# Patient Record
Sex: Female | Born: 1960
Health system: Southern US, Community
[De-identification: ages and names within clinical notes are randomized; demographics above are authoritative.]

## PROBLEM LIST (undated history)

## (undated) DIAGNOSIS — E7800 Pure hypercholesterolemia, unspecified: Secondary | ICD-10-CM

## (undated) DIAGNOSIS — R0789 Other chest pain: Secondary | ICD-10-CM

## (undated) DIAGNOSIS — R0602 Shortness of breath: Secondary | ICD-10-CM

## (undated) DIAGNOSIS — M8580 Other specified disorders of bone density and structure, unspecified site: Secondary | ICD-10-CM

## (undated) DIAGNOSIS — C541 Malignant neoplasm of endometrium: Secondary | ICD-10-CM

## (undated) DIAGNOSIS — Z923 Personal history of irradiation: Secondary | ICD-10-CM

## (undated) DIAGNOSIS — L819 Disorder of pigmentation, unspecified: Secondary | ICD-10-CM

## (undated) DIAGNOSIS — D219 Benign neoplasm of connective and other soft tissue, unspecified: Secondary | ICD-10-CM

## (undated) DIAGNOSIS — R079 Chest pain, unspecified: Secondary | ICD-10-CM

## (undated) DIAGNOSIS — R635 Abnormal weight gain: Secondary | ICD-10-CM

## (undated) DIAGNOSIS — S322XXA Fracture of coccyx, initial encounter for closed fracture: Secondary | ICD-10-CM

## (undated) DIAGNOSIS — T884XXA Failed or difficult intubation, initial encounter: Secondary | ICD-10-CM

## (undated) DIAGNOSIS — N369 Urethral disorder, unspecified: Secondary | ICD-10-CM

## (undated) DIAGNOSIS — E559 Vitamin D deficiency, unspecified: Secondary | ICD-10-CM

## (undated) DIAGNOSIS — J349 Unspecified disorder of nose and nasal sinuses: Secondary | ICD-10-CM

## (undated) DIAGNOSIS — R7303 Prediabetes: Secondary | ICD-10-CM

## (undated) DIAGNOSIS — F419 Anxiety disorder, unspecified: Secondary | ICD-10-CM

## (undated) DIAGNOSIS — F32A Depression, unspecified: Secondary | ICD-10-CM

## (undated) DIAGNOSIS — S3210XA Unspecified fracture of sacrum, initial encounter for closed fracture: Secondary | ICD-10-CM

## (undated) DIAGNOSIS — R238 Other skin changes: Secondary | ICD-10-CM

## (undated) DIAGNOSIS — F329 Major depressive disorder, single episode, unspecified: Secondary | ICD-10-CM

## (undated) DIAGNOSIS — R5383 Other fatigue: Secondary | ICD-10-CM

## (undated) DIAGNOSIS — K219 Gastro-esophageal reflux disease without esophagitis: Secondary | ICD-10-CM

## (undated) DIAGNOSIS — R233 Spontaneous ecchymoses: Secondary | ICD-10-CM

## (undated) DIAGNOSIS — K2970 Gastritis, unspecified, without bleeding: Secondary | ICD-10-CM

## (undated) HISTORY — DX: Other specified disorders of bone density and structure, unspecified site: M85.80

## (undated) HISTORY — DX: Pure hypercholesterolemia, unspecified: E78.00

## (undated) HISTORY — DX: Spontaneous ecchymoses: R23.3

## (undated) HISTORY — DX: Anxiety disorder, unspecified: F41.9

## (undated) HISTORY — DX: Depression, unspecified: F32.A

## (undated) HISTORY — PX: SMALL BOWEL ENTEROSCOPY: SHX2415

## (undated) HISTORY — PX: ADENOIDECTOMY: SUR15

## (undated) HISTORY — DX: Chest pain, unspecified: R07.9

## (undated) HISTORY — DX: Other skin changes: R23.8

## (undated) HISTORY — DX: Vitamin D deficiency, unspecified: E55.9

## (undated) HISTORY — DX: Unspecified fracture of sacrum, initial encounter for closed fracture: S32.2XXA

## (undated) HISTORY — DX: Shortness of breath: R06.02

## (undated) HISTORY — PX: TONSILLECTOMY: SUR1361

## (undated) HISTORY — DX: Abnormal weight gain: R63.5

## (undated) HISTORY — DX: Gastro-esophageal reflux disease without esophagitis: K21.9

## (undated) HISTORY — DX: Disorder of pigmentation, unspecified: L81.9

## (undated) HISTORY — PX: COLONOSCOPY: SHX174

## (undated) HISTORY — DX: Gastritis, unspecified, without bleeding: K29.70

## (undated) HISTORY — DX: Benign neoplasm of connective and other soft tissue, unspecified: D21.9

## (undated) HISTORY — DX: Other chest pain: R07.89

## (undated) HISTORY — DX: Other fatigue: R53.83

## (undated) HISTORY — DX: Unspecified disorder of nose and nasal sinuses: J34.9

## (undated) HISTORY — DX: Malignant neoplasm of endometrium: C54.1

## (undated) HISTORY — DX: Unspecified fracture of sacrum, initial encounter for closed fracture: S32.10XA

## (undated) HISTORY — DX: Major depressive disorder, single episode, unspecified: F32.9

## (undated) NOTE — *Deleted (*Deleted)
  Radiation Oncology         [phone] ________________________________  Name: Emily [name] MRN: [id number]  Date: 06/28/2020  DOB: July 21, 1961  CC: Shirlean Mylar, MD  Carver Fila, MD  HDR BRACHYTHERAPY NOTE  DIAGNOSIS: Stage IIIA (pT3a, pN0) endometrioid endometrial adenocarcinoma, FIGO grade 2   Simple treatment device note: Patient had construction of her custom vaginal cylinder. She will be treated with a *** cm diameter segmented cylinder. This conforms to her anatomy without undue discomfort.  Vaginal brachytherapy procedure node: The patient was brought to the HDR suite. Identity was confirmed. All relevant records and images related to the planned course of therapy were reviewed. The patient freely provided informed written consent to proceed with treatment after reviewing the details related to the planned course of therapy. The consent form was witnessed and verified by the simulation staff. Then, the patient was set-up in a stable reproducible supine position for radiation therapy. Pelvic exam revealed the vaginal cuff to be intact ***. The patient's custom vaginal cylinder was placed in the proximal vagina. This was affixed to the CT/MR stabilization plate to prevent slippage. Patient tolerated the placement well.  Verification simulation note:  A fiducial marker was placed within the vaginal cylinder. An AP and lateral film was then obtained through the pelvis area. This documented accurate position of the vaginal cylinder for treatment.  HDR BRACHYTHERAPY TREATMENT  The remote afterloading device was affixed to the vaginal cylinder by catheter. Patient then proceeded to undergo her first high-dose-rate treatment directed at the proximal vagina. The patient was prescribed a dose of *** gray to be delivered to the mucosal surface. Treatment length was *** cm. Patient was treated with *** channel using *** dwell positions. Treatment time was *** seconds. Iridium 192 was  the high-dose-rate source for treatment. The patient tolerated the treatment well. After completion of her therapy, a radiation survey was performed documenting return of the iridium source into the GammaMed safe.   PLAN: The patient will return on 07/09/2020 for her second high-dose-rate treatment. ________________________________    Billie Lade, PhD, MD  This document serves as a record of services personally performed by Antony Blackbird, MD. It was created on his behalf by Nikki Dom, a trained medical scribe. The creation of this record is based on the scribe's personal observations and the provider's statements to them. This document has been checked and approved by the attending provider.

---

## 2003-02-20 ENCOUNTER — Emergency Department (HOSPITAL_COMMUNITY): Admission: EM | Admit: 2003-02-20 | Discharge: 2003-02-20 | Payer: Self-pay | Admitting: Emergency Medicine

## 2003-10-25 ENCOUNTER — Encounter: Admission: RE | Admit: 2003-10-25 | Discharge: 2003-10-25 | Payer: Self-pay | Admitting: Internal Medicine

## 2003-11-29 ENCOUNTER — Emergency Department (HOSPITAL_COMMUNITY): Admission: AD | Admit: 2003-11-29 | Discharge: 2003-11-29 | Payer: Self-pay | Admitting: Family Medicine

## 2003-11-29 ENCOUNTER — Other Ambulatory Visit: Admission: RE | Admit: 2003-11-29 | Discharge: 2003-11-29 | Payer: Self-pay | Admitting: Obstetrics and Gynecology

## 2004-03-25 ENCOUNTER — Encounter: Admission: RE | Admit: 2004-03-25 | Discharge: 2004-03-25 | Payer: Self-pay | Admitting: Internal Medicine

## 2004-04-10 ENCOUNTER — Emergency Department (HOSPITAL_COMMUNITY): Admission: EM | Admit: 2004-04-10 | Discharge: 2004-04-10 | Payer: Self-pay | Admitting: Emergency Medicine

## 2004-04-15 ENCOUNTER — Encounter: Admission: RE | Admit: 2004-04-15 | Discharge: 2004-04-15 | Payer: Self-pay | Admitting: Obstetrics and Gynecology

## 2005-01-28 ENCOUNTER — Ambulatory Visit: Payer: Self-pay | Admitting: Internal Medicine

## 2009-12-05 ENCOUNTER — Other Ambulatory Visit: Admission: RE | Admit: 2009-12-05 | Discharge: 2009-12-05 | Payer: Self-pay | Admitting: Obstetrics and Gynecology

## 2010-04-17 ENCOUNTER — Emergency Department (HOSPITAL_COMMUNITY): Admission: EM | Admit: 2010-04-17 | Discharge: 2010-04-17 | Payer: Self-pay | Admitting: Family Medicine

## 2011-05-11 ENCOUNTER — Inpatient Hospital Stay (INDEPENDENT_AMBULATORY_CARE_PROVIDER_SITE_OTHER)
Admission: RE | Admit: 2011-05-11 | Discharge: 2011-05-11 | Disposition: A | Discharge status: Home / Self Care | Payer: Commercial Managed Care - PPO | Source: Ambulatory Visit | Attending: Family Medicine | Admitting: Family Medicine

## 2014-11-01 ENCOUNTER — Other Ambulatory Visit: Payer: Self-pay | Admitting: Family Medicine

## 2014-11-01 DIAGNOSIS — R102 Pelvic and perineal pain: Secondary | ICD-10-CM

## 2014-11-06 ENCOUNTER — Ambulatory Visit
Admission: RE | Admit: 2014-11-06 | Discharge: 2014-11-06 | Disposition: A | Discharge status: Home / Self Care | Payer: 59 | Source: Ambulatory Visit | Attending: Family Medicine | Admitting: Family Medicine

## 2014-11-06 DIAGNOSIS — R102 Pelvic and perineal pain: Secondary | ICD-10-CM

## 2015-07-16 ENCOUNTER — Ambulatory Visit (INDEPENDENT_AMBULATORY_CARE_PROVIDER_SITE_OTHER): Payer: 59 | Admitting: Cardiology

## 2015-07-16 ENCOUNTER — Encounter: Payer: Self-pay | Admitting: Cardiology

## 2015-07-16 VITALS — BP 112/58 | HR 63 | Ht 64.0 in | Wt 152.0 lb

## 2015-07-16 DIAGNOSIS — S322XXA Fracture of coccyx, initial encounter for closed fracture: Secondary | ICD-10-CM

## 2015-07-16 DIAGNOSIS — R0602 Shortness of breath: Secondary | ICD-10-CM

## 2015-07-16 DIAGNOSIS — F329 Major depressive disorder, single episode, unspecified: Secondary | ICD-10-CM

## 2015-07-16 DIAGNOSIS — Z8249 Family history of ischemic heart disease and other diseases of the circulatory system: Secondary | ICD-10-CM | POA: Diagnosis not present

## 2015-07-16 DIAGNOSIS — D219 Benign neoplasm of connective and other soft tissue, unspecified: Secondary | ICD-10-CM | POA: Insufficient documentation

## 2015-07-16 DIAGNOSIS — R9431 Abnormal electrocardiogram [ECG] [EKG]: Secondary | ICD-10-CM

## 2015-07-16 DIAGNOSIS — R635 Abnormal weight gain: Secondary | ICD-10-CM | POA: Insufficient documentation

## 2015-07-16 DIAGNOSIS — E785 Hyperlipidemia, unspecified: Secondary | ICD-10-CM

## 2015-07-16 DIAGNOSIS — E7800 Pure hypercholesterolemia, unspecified: Secondary | ICD-10-CM

## 2015-07-16 DIAGNOSIS — E559 Vitamin D deficiency, unspecified: Secondary | ICD-10-CM | POA: Insufficient documentation

## 2015-07-16 DIAGNOSIS — S3210XA Unspecified fracture of sacrum, initial encounter for closed fracture: Secondary | ICD-10-CM

## 2015-07-16 DIAGNOSIS — M8580 Other specified disorders of bone density and structure, unspecified site: Secondary | ICD-10-CM

## 2015-07-16 DIAGNOSIS — F419 Anxiety disorder, unspecified: Secondary | ICD-10-CM | POA: Insufficient documentation

## 2015-07-16 DIAGNOSIS — R079 Chest pain, unspecified: Secondary | ICD-10-CM | POA: Insufficient documentation

## 2015-07-16 DIAGNOSIS — R0789 Other chest pain: Secondary | ICD-10-CM

## 2015-07-16 DIAGNOSIS — L819 Disorder of pigmentation, unspecified: Secondary | ICD-10-CM

## 2015-07-16 DIAGNOSIS — F32A Depression, unspecified: Secondary | ICD-10-CM

## 2015-07-16 DIAGNOSIS — D259 Leiomyoma of uterus, unspecified: Secondary | ICD-10-CM

## 2015-07-16 DIAGNOSIS — R072 Precordial pain: Secondary | ICD-10-CM | POA: Diagnosis not present

## 2015-07-16 HISTORY — DX: Hyperlipidemia, unspecified: E78.5

## 2015-07-16 HISTORY — DX: Family history of ischemic heart disease and other diseases of the circulatory system: Z82.49

## 2015-07-16 HISTORY — DX: Abnormal electrocardiogram (ECG) (EKG): R94.31

## 2015-07-16 NOTE — Progress Notes (Signed)
Patient ID: Emily [name], female   DOB: 1960/09/26, 54 y.o.   MRN: [id number]      Cardiology Office Note  Date:  07/16/2015   ID:  Macie Baum, DOB 02/05/1961, MRN [id number]  PCP:  Jonathon Bellows, MD  Cardiologist:  Dorothy Spark, MD   Chief complain: Chest pain   History of Present Illness: Emily [name] is a 54 y.o. female who presents for evaluation of chest pain and dyspnea on exertion. The patient states that she used to be very active and run, currently minimally active. 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 She started to experience left sided chest pains and left arm pain not related to activity. She describes that occasionally she pushes herself to run but feels exhausted and unusually tired afterwards. No palpitations or syncope. Her lipids have been elevated but she is not being treated. The patient is very anxious. Her father had MI at age 35, and died of MI in his 47'. Her mother was diagnosed with atrial fibrillation with bradycardia requiring PM placement and had a complication - RV perforation. The patient was referred to [place] from her PCP office for abnormal ECG.  No h/o SCD in her family.   Past Medical History  Diagnosis Date  . Elevated cholesterol   . Fx sacrum/coccyx-closed ([place])   . Osteopenia   . Chest tightness   . Fatigue   . Chest pain   . Weight gain   . SOB (shortness of breath) on exertion   . Anxiety and depression   . Pigmented skin lesions   . Fibroid   . Vitamin D deficiency     Past Surgical History  Procedure Laterality Date  . No past surgeries       Current Outpatient Prescriptions  Medication Sig Dispense Refill  . benzonatate (TESSALON PERLES) 100 MG capsule Take 100 mg by mouth 3 (three) times daily as needed for cough.     No current facility-administered medications for this visit.    Allergies:   Review of patient's allergies indicates no known allergies.    Social History:  The patient  reports that  she has never smoked. She does not have any smokeless tobacco history on file. She reports that she drinks alcohol.   Family History:  The patient's family history includes Atrial fibrillation in her father and mother.    ROS:  Please see the history of present illness.   Otherwise, review of systems are positive for none.   All other systems are reviewed and negative.    PHYSICAL EXAM: VS:  BP 112/58 mmHg  Pulse 63  Ht 5\' 4"  (1.626 m)  Wt 152 lb (68.947 kg)  BMI 26.08 kg/m2  SpO2 98% , BMI Body mass index is 26.08 kg/(m^2). GEN: Well nourished, well developed, in no acute distress HEENT: normal Neck: no JVD, carotid bruits, or masses Cardiac: RRR; no murmurs, rubs, or gallops,no edema  Respiratory:  clear to auscultation bilaterally, normal work of breathing GI: soft, nontender, nondistended, + BS MS: no deformity or atrophy Skin: warm and dry, no rash Neuro:  Strength and sensation are intact Psych: euthymic mood, full affect  EKG:  EKG is not ordered today. The ekg from 07/11/2015 from [facility] office shows SR, negative T waves in the inferior and anterolateral leads  Recent Labs: No results found for requested labs within last 365 days.   Lipid Panel No results found for: CHOL, TRIG, HDL, CHOLHDL, VLDL, LDLCALC, LDLDIRECT  Wt Readings from Last 3 Encounters:  07/16/15 152 lb (68.947 kg)      ASSESSMENT AND PLAN:  54 year old female  1. Chest pain with some typical and some atypical features - concern is profound fatigue after exertion and abnormal ECG with negative T waves in the inferior and anterolateral leads suggestive of ischemia.  This patient is very anxious, I have spent 45 minutes explaining diagnostic options, risks and benefits of all.   I suggested an exercise nuclear stress test that she refuses as her father had a negative stress test and had a heart attack a week later. She is offered a cardiac cath, and explained in details risks and benefits vs  coronary CT. She wants cath at first but is only willing to have with an interventional cardiologist with 30+ years of experience. She requests both cardiac cath and coronary CT, then wants to think about it and call us back.  She is advised that all of the above modalities wold be acceptable diagnostic option and she should proceed with one of them. She wants to call us back.   We will schedule an echocardiogram to evaluate for systolic and diastolic function and possible LV wall motion abnormalities.   2. BP - controlled  3. Hyperlipidemia - she had lipids drawn at [facility] office few days ago, per patient elevated, we will request and advise treatment appropriately.   Follow up in 2 months.   Signed, Dorothy Spark, MD  07/16/2015 3:49 PM    [facility] [place], [place],   [address] Phone: [phone]; Fax: [phone]

## 2015-07-16 NOTE — Patient Instructions (Signed)
Medication Instructions:   Your physician recommends that you continue on your current medications as directed. Please refer to the Current Medication list given to you today.    Testing/Procedures:  Your physician has requested that you have an echocardiogram. Echocardiography is a painless test that uses sound waves to create images of your heart. It provides your doctor with information about the size and shape of your heart and how well your heart's chambers and valves are working. This procedure takes approximately one hour. There are no restrictions for this procedure.     Follow-Up:  2 MONTHS WITH DR NELSON      If you need a refill on your cardiac medications before your next appointment, please call your pharmacy.   

## 2015-07-18 ENCOUNTER — Telehealth: Payer: Self-pay | Admitting: Cardiology

## 2015-07-18 NOTE — Telephone Encounter (Signed)
Pt calling to inform Dr Meda Coffee that she will proceed with [place] ordering for her to have a exercise myoview and coronary cta once her echo is complete and resulted next week.  Pt states she will be having her echo done on 07/25/15.  Pt prefers for Dr Meda Coffee and myself to hold off putting the order for the stress test and cta in, until her echo is resulted.  Informed the pt that we will respect her request.  Informed the pt that I will make Dr Meda Coffee aware of her decision to proceed with the stress test and coronary cta (if needed), after her echo is complete.  Pt verbalized understanding, and very gracious for all the assistance provided.

## 2015-07-18 NOTE — Telephone Encounter (Signed)
New Message   Pt wants to have stress test and CT after findings of Echo

## 2015-07-19 ENCOUNTER — Encounter: Payer: Self-pay | Admitting: Cardiology

## 2015-07-20 ENCOUNTER — Telehealth: Payer: Self-pay | Admitting: Cardiology

## 2015-07-20 NOTE — Telephone Encounter (Signed)
New Message    Pt calling stating she has questions about her upcoming Echo. Pt states that with her [facility] insurance the only way for them to cover it at 100% is for it to be done within 7 days from her last appt. The soonest appt we have for an Echo is 07/24/15 and the pt states this is outside of the 7 day window and she needs to get her Echo done prior to then. Pt wanted to know if she could get this done somewhere else to see if she could get it done on Monday and I notified the pt that [facility] could do it but she would need to call their Echo department to find out if they had any openings. Pt states that she needs a call back from [facility] asap to help her with this. Notified pt that Karlene Einstein is not here today and the message will be sent to triage. Pt states she needs a call back today from any nurse. Please call back and advise.

## 2015-07-20 NOTE — Telephone Encounter (Signed)
Patient st because her tests were not scheduled within 7 days of her OV, [employer] will not pay 100% of the cost. Spoke with Billing and Everlean Patterson, who both state this might be a misunderstanding about hospital vs outpatient cost.  Patient adamant that there is a 7 day period that all her tests must be done. She requests our billing department look into it and get her out of pocket cost per test she needs done (for ECHO, myoview, and CTA).  Informed patient a message will be sent to Billing and either Billing or Dr. Francesca Oman nurse will call with an update early next week. Patient agrees with treatment plan.

## 2015-07-23 NOTE — Telephone Encounter (Signed)
Left message for the pt to call back in regards to call placed on 07/20/15 with concerns of insurance and having her echo done.  Have a blocked time for today for the pt to have her echo done at 2pm at our office.  Left a detailed message for the pt to call back to confirm this appointment date and time, for this will meet her "7 day look back period, from her OV with Dr Meda Coffee to meet 100 % coverage through [facility]."  This is coming from the pt stating this on Friday 11/11, to a triage nurse. Highly advised the pt to call back to confirm this held slot.

## 2015-07-23 NOTE — Telephone Encounter (Signed)
Pt called back and I offered her an echo appt for today at 2 pm and she declined this offer, stating this is greater than her "7 day look back period." Informed the pt that being this is out of a nurses scope and more of her concerns are geared towards billing questions with her insurance coverage, I will go and speak with Billing dept and have them follow-up with the pt.  Did speak with Suanne Marker in billing and she has ample information to provide the pt based on her insurance plan, and having a Specialist order tests.  Per Suanne Marker she will contact the pt via phone to inform her of what her insurance will cover, for her to receive the echo.

## 2015-07-24 ENCOUNTER — Ambulatory Visit (HOSPITAL_COMMUNITY): Payer: 59

## 2015-07-25 ENCOUNTER — Other Ambulatory Visit (HOSPITAL_COMMUNITY): Payer: 59

## 2015-07-26 ENCOUNTER — Telehealth: Payer: Self-pay | Admitting: Cardiology

## 2015-07-26 NOTE — Telephone Encounter (Signed)
Error

## 2015-09-19 ENCOUNTER — Ambulatory Visit (HOSPITAL_COMMUNITY): Payer: 59

## 2015-09-19 ENCOUNTER — Ambulatory Visit: Payer: 59 | Admitting: Cardiology

## 2015-09-19 ENCOUNTER — Other Ambulatory Visit (HOSPITAL_COMMUNITY): Payer: 59

## 2015-09-19 ENCOUNTER — Other Ambulatory Visit: Payer: Self-pay

## 2015-09-19 ENCOUNTER — Ambulatory Visit (HOSPITAL_COMMUNITY): Payer: 59 | Attending: Cardiovascular Disease

## 2015-09-19 DIAGNOSIS — R9431 Abnormal electrocardiogram [ECG] [EKG]: Secondary | ICD-10-CM

## 2015-09-19 DIAGNOSIS — E785 Hyperlipidemia, unspecified: Secondary | ICD-10-CM | POA: Diagnosis not present

## 2015-09-19 DIAGNOSIS — R072 Precordial pain: Secondary | ICD-10-CM | POA: Diagnosis not present

## 2015-09-19 DIAGNOSIS — Z8249 Family history of ischemic heart disease and other diseases of the circulatory system: Secondary | ICD-10-CM | POA: Insufficient documentation

## 2015-09-25 ENCOUNTER — Ambulatory Visit (INDEPENDENT_AMBULATORY_CARE_PROVIDER_SITE_OTHER): Payer: 59 | Admitting: Cardiology

## 2015-09-25 ENCOUNTER — Encounter: Payer: Self-pay | Admitting: Cardiology

## 2015-09-25 VITALS — BP 110/80 | HR 64 | Ht 64.0 in | Wt 145.0 lb

## 2015-09-25 DIAGNOSIS — R0602 Shortness of breath: Secondary | ICD-10-CM | POA: Diagnosis not present

## 2015-09-25 DIAGNOSIS — Z8249 Family history of ischemic heart disease and other diseases of the circulatory system: Secondary | ICD-10-CM

## 2015-09-25 DIAGNOSIS — R079 Chest pain, unspecified: Secondary | ICD-10-CM

## 2015-09-25 DIAGNOSIS — R9431 Abnormal electrocardiogram [ECG] [EKG]: Secondary | ICD-10-CM

## 2015-09-25 NOTE — Assessment & Plan Note (Signed)
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

## 2015-09-25 NOTE — Patient Instructions (Signed)
Medication Instructions:   CONTINUE SAME MEDICATIONS  If you need a refill on your cardiac medications before your next appointment, please call your pharmacy.  Labwork: NONE ORDER TODAY    Testing/Procedures:   Your physician has requested that you have cardiac CT. Cardiac computed tomography (CT) is a painless test that uses an x-ray machine to take clear, detailed pictures of your heart. For further information please visit [email or web address]. Please follow instruction sheet as given.     Follow-Up: AS SCHEDULED 2/.8/17   Any Other Special Instructions Will Be Listed Below (If Applicable).

## 2015-09-25 NOTE — Progress Notes (Signed)
09/25/2015 Emily [name]   May 29, 1961  [id number]  Primary Physician Jonathon Bellows, MD Primary Cardiologist: Dr Meda Coffee  HPI:  55 y.o. female seen by Dr Meda Coffee 07/16/15  for evaluation of chest pain and dyspnea on exertion. The patient states that she used to be very active and run, currently minimally active. 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 She started to experience left sided chest pains and left arm pain in Nov that is hard for her to describe.  She says that occasionally she pushes herself to run but feels exhausted and unusually tired afterwards.Sh denies palpitations or syncope. Her lipids have been elevated but she is not being treated. Her father had MI at age 68, and died of MI in his 37'. Her mother was diagnosed with atrial fibrillation with bradycardia requiring PM placement and had a complication - RV perforation.The patient was referred to Dr Meda Coffee by her PCP office for the above symptoms and an abnormal ECG with TWI in V3-V6. Dr Meda Coffee discussed further work up with the pt at length during her visit in Nov. Dr Meda Coffee suggested an exercise nuclear stress test, the pt refused as her father had a negative stress test and had a heart attack a week later. She was offered a cardiac cath, and explained in details risks and benefits vs coronary CT. She wanted a cath at first but is only willing to have with an interventional cardiologist with 30+ years of experience perform this. She then wanted to think about it and call us back. In the interm she did agree to an echo. This was done 09/19/15. She is here for follow up from that echo.    Current Outpatient Prescriptions  Medication Sig Dispense Refill  . benzonatate (TESSALON PERLES) 100 MG capsule Take 100 mg by mouth 3 (three) times daily as needed for cough.    . doxylamine, Sleep, (UNISOM) 25 MG tablet Take 25 mg by mouth at bedtime as needed for sleep.     No current facility-administered medications for this visit.     No Known Allergies  Social History   Social History  . Marital Status: Married    Spouse Name: N/A  . Number of Children: N/A  . Years of Education: N/A   Occupational History  . Not on file.   Social History Main Topics  . Smoking status: Never Smoker   . Smokeless tobacco: Not on file  . Alcohol Use: Yes  . Drug Use: Not on file  . Sexual Activity: Not on file   Other Topics Concern  . Not on file   Social History Narrative     Review of Systems: General: negative for chills, fever, night sweats or weight changes.  Cardiovascular: negative for edema, orthopnea, palpitations, paroxysmal nocturnal dyspnea or shortness of breath Dermatological: negative for rash Respiratory: negative for cough or wheezing Urologic: negative for hematuria Abdominal: negative for nausea, vomiting, diarrhea, bright red blood per rectum, melena, or hematemesis Neurologic: negative for visual changes, syncope, or dizziness All other systems reviewed and are otherwise negative except as noted above.    Blood pressure 110/80, pulse 64, height 5\' 4"  (1.626 m), weight 145 lb (65.772 kg).  General appearance: alert, cooperative and no distress   Echo 09/19/15 Study Conclusions  - Left ventricle: The cavity size was normal. Wall thickness was normal. Systolic function was normal. The estimated ejection fraction was in the range of 55% to 60%. Wall motion was normal; there were no  regional wall motion abnormalities.   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  She says she is sure something is wrong and wants to  pursue this further. I suggested we schedule a coronary CT as suggested by Dr Meda Coffee and she is agreeable.   [name] PA-C 09/25/2015 4:12 PM

## 2015-09-25 NOTE — Assessment & Plan Note (Signed)
Father had MI 81's, died at 42 of an MI

## 2015-09-25 NOTE — Assessment & Plan Note (Signed)
Saw Dr Meda Coffee in Nov- stress test or CTA recommended then

## 2015-09-25 NOTE — Assessment & Plan Note (Signed)
TWI V3-V6

## 2015-09-27 ENCOUNTER — Telehealth: Payer: Self-pay | Admitting: Cardiology

## 2015-09-27 NOTE — Telephone Encounter (Signed)
New Message  Pt returning RN phone call. Please call back and discuss.   

## 2015-09-27 NOTE — Telephone Encounter (Signed)
Notified the pt that per Dr Meda Coffee, her echo was normal, with normal LV function, and no significant valvular abnormalities.  Pt verbalized understanding.

## 2015-10-08 ENCOUNTER — Encounter: Payer: Self-pay | Admitting: Cardiology

## 2015-10-11 ENCOUNTER — Encounter (HOSPITAL_COMMUNITY): Payer: Self-pay

## 2015-10-11 ENCOUNTER — Ambulatory Visit (HOSPITAL_COMMUNITY)
Admission: RE | Admit: 2015-10-11 | Discharge: 2015-10-11 | Disposition: A | Discharge status: Home / Self Care | Payer: 59 | Source: Ambulatory Visit | Attending: Cardiology | Admitting: Cardiology

## 2015-10-11 DIAGNOSIS — Z8249 Family history of ischemic heart disease and other diseases of the circulatory system: Secondary | ICD-10-CM | POA: Insufficient documentation

## 2015-10-11 DIAGNOSIS — R9431 Abnormal electrocardiogram [ECG] [EKG]: Secondary | ICD-10-CM | POA: Diagnosis not present

## 2015-10-11 DIAGNOSIS — R079 Chest pain, unspecified: Secondary | ICD-10-CM | POA: Insufficient documentation

## 2015-10-11 MED ORDER — NITROGLYCERIN 0.4 MG SL SUBL
SUBLINGUAL_TABLET | SUBLINGUAL | Status: AC
  Filled 2015-10-11: qty 1

## 2015-10-11 MED ORDER — IOHEXOL 350 MG/ML SOLN
80.0000 mL | Freq: Once | INTRAVENOUS | Status: AC | PRN
  Administered 2015-10-11: 80 mL via INTRAVENOUS

## 2015-10-12 ENCOUNTER — Telehealth: Payer: Self-pay | Admitting: Cardiology

## 2015-10-12 NOTE — Telephone Encounter (Signed)
Calcium score 0 and no evidence of CAD.

## 2015-10-12 NOTE — Telephone Encounter (Signed)
Pt is calling for her coronary cta results.  Will forward this message to Dr Meda Coffee for further review of this study and follow-up with the pt thereafter with her results.

## 2015-10-12 NOTE — Telephone Encounter (Signed)
Notified the pt that per Dr Meda Coffee, her calcium score was 0 and there is no evidence of CAD.  Pt verbalized understanding.

## 2015-10-12 NOTE — Telephone Encounter (Signed)
Follow Up  Pt called for CT results

## 2015-10-12 NOTE — Telephone Encounter (Signed)
Follow up   Pt called for CT results

## 2015-10-15 ENCOUNTER — Ambulatory Visit: Payer: 59 | Admitting: Cardiology

## 2015-10-17 ENCOUNTER — Encounter: Payer: Self-pay | Admitting: Cardiology

## 2015-10-17 ENCOUNTER — Ambulatory Visit (INDEPENDENT_AMBULATORY_CARE_PROVIDER_SITE_OTHER): Payer: 59 | Admitting: Cardiology

## 2015-10-17 VITALS — BP 118/64 | HR 72 | Ht 64.0 in | Wt 145.0 lb

## 2015-10-17 DIAGNOSIS — E7800 Pure hypercholesterolemia, unspecified: Secondary | ICD-10-CM

## 2015-10-17 DIAGNOSIS — Z8249 Family history of ischemic heart disease and other diseases of the circulatory system: Secondary | ICD-10-CM | POA: Diagnosis not present

## 2015-10-17 DIAGNOSIS — R9431 Abnormal electrocardiogram [ECG] [EKG]: Secondary | ICD-10-CM | POA: Diagnosis not present

## 2015-10-17 LAB — COMPREHENSIVE METABOLIC PANEL
ALT: 19 U/L (ref 6–29)
AST: 18 U/L (ref 10–35)
Albumin: 4.2 g/dL (ref 3.6–5.1)
Alkaline Phosphatase: 60 U/L (ref 33–130)
BUN: 18 mg/dL (ref 7–25)
CO2: 25 mmol/L (ref 20–31)
Calcium: 9.1 mg/dL (ref 8.6–10.4)
Chloride: 105 mmol/L (ref 98–110)
Creat: 0.91 mg/dL (ref 0.50–1.05)
Glucose, Bld: 91 mg/dL (ref 65–99)
Potassium: 4.3 mmol/L (ref 3.5–5.3)
Sodium: 140 mmol/L (ref 135–146)
Total Bilirubin: 0.5 mg/dL (ref 0.2–1.2)
Total Protein: 6.7 g/dL (ref 6.1–8.1)

## 2015-10-17 NOTE — Progress Notes (Signed)
Patient ID: Emily [name], female   DOB: 27-Dec-1960, 55 y.o.   MRN: [id number]     10/17/2015 Emily [name]   1960-11-23  [id number]  Primary Physician Jonathon Bellows, MD Primary Cardiologist: Dr Meda Coffee  HPI:  55 y.o. female seen by Dr Meda Coffee 07/16/15  for evaluation of chest pain and dyspnea on exertion. The patient states that she used to be very active and run, currently minimally active. 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 She started to experience left sided chest pains and left arm pain in Nov that is hard for her to describe.  She says that occasionally she pushes herself to run but feels exhausted and unusually tired afterwards.Sh denies palpitations or syncope. Her lipids have been elevated but she is not being treated. Her father had MI at age 28, and died of MI in his 41'. Her mother was diagnosed with atrial fibrillation with bradycardia requiring PM placement and had a complication - RV perforation.The patient was referred to Dr Meda Coffee by her PCP office for the above symptoms and an abnormal ECG with TWI in V3-V6. Dr Meda Coffee discussed further work up with the pt at length during her visit in Nov. Dr Meda Coffee suggested an exercise nuclear stress test, the pt refused as her father had a negative stress test and had a heart attack a week later. She was offered a cardiac cath, and explained in details risks and benefits vs coronary CT. She wanted a cath at first but is only willing to have with an interventional cardiologist with 30+ years of experience perform this. She then wanted to think about it and call us back. In the interm she did agree to an echo. This was done 09/19/15. She is here for follow up from that echo.   10/17/2015 - the patient is coming for follow-up for results of her test. She underwent coronary CT including calcium score. Coronary CT showed normal coronary artery origin and no evidence of coronary artery disease. Her calcium score was 0. The patient feels very  disappointed about her results as she note there is something wrong because she feels episodic abnormal feelings in her heart that she can't describe more closely, they don't feel like chest pain, shortness of breath or palpitation but they feel like "catch". She denies any syncope in the past.   Current Outpatient Prescriptions  Medication Sig Dispense Refill  . benzonatate (TESSALON PERLES) 100 MG capsule Take 100 mg by mouth 3 (three) times daily as needed for cough.    . doxylamine, Sleep, (UNISOM) 25 MG tablet Take 25 mg by mouth at bedtime as needed for sleep.    Marland Kitchen MAGNESIUM PO Take 1 tablet by mouth daily.    . MULTIPLE VITAMIN PO Take 1 tablet by mouth daily.    . Omega-3 Fatty Acids (FISH OIL PO) Take 1 capsule by mouth daily.    Marland Kitchen VITAMIN E PO Take 1 capsule by mouth daily.     No current facility-administered medications for this visit.    No Known Allergies  Social History   Social History  . Marital Status: Single    Spouse Name: N/A  . Number of Children: N/A  . Years of Education: N/A   Occupational History  . Not on file.   Social History Main Topics  . Smoking status: Never Smoker   . Smokeless tobacco: Not on file  . Alcohol Use: Yes  . Drug Use: Not on file  . Sexual Activity:  Not on file   Other Topics Concern  . Not on file   Social History Narrative     Review of Systems: General: negative for chills, fever, night sweats or weight changes.  Cardiovascular: negative for edema, orthopnea, palpitations, paroxysmal nocturnal dyspnea or shortness of breath Dermatological: negative for rash Respiratory: negative for cough or wheezing Urologic: negative for hematuria Abdominal: negative for nausea, vomiting, diarrhea, bright red blood per rectum, melena, or hematemesis Neurologic: negative for visual changes, syncope, or dizziness All other systems reviewed and are otherwise negative except as noted above.  Blood pressure 118/64, pulse 72, height 5'  4" (1.626 m), weight 145 lb (65.772 kg).  General appearance: alert, cooperative and no distress   Echo 09/19/15 Study Conclusions  - Left ventricle: The cavity size was normal. Wall thickness was normal. Systolic function was normal. The estimated ejection fraction was in the range of 55% to 60%. Wall motion was normal; there were no regional wall motion abnormalities.  Coronary CT 10/11/2015 IMPRESSION: 1. Coronary calcium score of 0 this was 0percentile for age and sex matched control. 2. Normal coronary Right dominance. 3. No evidence of CAD.   ASSESSMENT AND PLAN:   This is a 55 year old patient who presented with abnormal EKG and negative T waves in the anterolateral leads and nonspecific changes in the inferior leads. The patient had completely normal echocardiogram and coronary CT without any evidence of coronary artery disease and normal calcium score of 0. I had a long discussion with her about abnormal EKG such as her ketamine is ischemia that was ruled out, repolarization abnormalities associated with either left ventricular hypertrophy or hypertrophic cardiomyopathy. Both of those were excluded on her normal echocardiogram. The patient seems to be very concerned and very disappointed by the lack of diagnosis of or abnormality found.  At this point we will repeat her EKG to see there are any changes in 3 months. We will obtain her lipid profile was abnormal 10 years ago but she didn't want to take statins at the time. Considering her significant history of premature cardiac disease in her family if her lipids are abnormal we will start low-dose of statin.  Follow-up in 3 months.   Dorothy Spark PA-C 10/17/2015 12:16 PM

## 2015-10-17 NOTE — Patient Instructions (Signed)
Medication Instructions:   Your physician recommends that you continue on your current medications as directed. Please refer to the Current Medication list given to you today.   Labwork:  TODAY--CMET AND NMR WITH LIPIDS    Follow-Up:  3 MONTHS WITH DR Meda Coffee AND YOU WILL HAVE A EKG AT THAT VISIT      If you need a refill on your cardiac medications before your next appointment, please call your pharmacy.

## 2015-10-21 LAB — CARDIO IQ(R) ADVANCED LIPID PANEL
Apolipoprotein B: 97 mg/dL (ref 49–103)
Cholesterol, Total: 205 mg/dL — ABNORMAL HIGH (ref 125–200)
Cholesterol/HDL Ratio: 3.4 calc (ref <=5.0)
HDL Cholesterol: 61 mg/dL (ref >=46)
LDL Large: 7447 nmol/L (ref 5038–17886)
LDL Medium: 261 nmol/L (ref 121–397)
LDL Particle Number: 1553 nmol/L (ref 1016–2185)
LDL Peak Size: 224.8 Angstrom (ref >=218.2)
LDL Small: 156 nmol/L (ref 115–386)
LDL, Calculated: 111 mg/dL
Lipoprotein (a): 41 nmol/L (ref <75)
Non-HDL Cholesterol: 144 mg/dL
Triglycerides: 163 mg/dL — ABNORMAL HIGH

## 2015-10-22 ENCOUNTER — Telehealth: Payer: Self-pay | Admitting: *Deleted

## 2015-10-22 DIAGNOSIS — E785 Hyperlipidemia, unspecified: Secondary | ICD-10-CM

## 2015-10-22 DIAGNOSIS — Z79899 Other long term (current) drug therapy: Secondary | ICD-10-CM

## 2015-10-22 MED ORDER — ROSUVASTATIN CALCIUM 10 MG PO TABS: 10.0000 mg | ORAL_TABLET | Freq: Every day | ORAL | Status: DC

## 2015-10-22 NOTE — Telephone Encounter (Signed)
PT AWARE OF LAB RESULTS./CY 

## 2015-10-22 NOTE — Telephone Encounter (Signed)
-----   Message from Dorothy Spark, MD sent at 10/22/2015 12:35 PM EST ----- Her LDL and TG ar both elevated, I would start her on rosuvastatin 10 mg po daily and check CMP and lipids in 6 weeks.

## 2015-11-08 MED FILL — ROSUVASTATIN CALCIUM 10 MG: 10 | 90 days supply | Qty: 90 | Fill #0

## 2015-11-29 ENCOUNTER — Telehealth: Payer: Self-pay | Admitting: Cardiology

## 2015-11-29 DIAGNOSIS — E785 Hyperlipidemia, unspecified: Secondary | ICD-10-CM

## 2015-11-29 DIAGNOSIS — Z8249 Family history of ischemic heart disease and other diseases of the circulatory system: Secondary | ICD-10-CM

## 2015-11-29 NOTE — Telephone Encounter (Signed)
Will route this message to Dr Meda Coffee for further review of orders requested by the pt and follow-up with her thereafter.

## 2015-11-29 NOTE — Telephone Encounter (Signed)
New message      Pt want to stop crestor and start zocor because of daily calf pain.  She states that she took zocor in 2012.  Also, patient want to have her potassium checked and have a muscle enzyme level.  Please let her know if this is ok?

## 2015-11-29 NOTE — Telephone Encounter (Signed)
Notified the pt that per Dr Meda Coffee, she recommends that she stop her crestor and we check a cmet and CPK, and if that's normal then we can start her on zocor.  Scheduled the pts lab appt for next Tuesday 12/04/15 to check a cmet and cpk.  Crestor taken out of the pts med list.  Updated pts allergy list with intolerance to crestor. Pt verbalized understanding and agrees with this plan.

## 2015-11-29 NOTE — Telephone Encounter (Signed)
I would recommend to stop crestor and check CMP and CPK, if normal she can start taking zocor.

## 2015-12-04 ENCOUNTER — Other Ambulatory Visit (INDEPENDENT_AMBULATORY_CARE_PROVIDER_SITE_OTHER): Payer: 59 | Admitting: *Deleted

## 2015-12-04 ENCOUNTER — Telehealth: Payer: Self-pay | Admitting: *Deleted

## 2015-12-04 DIAGNOSIS — Z8249 Family history of ischemic heart disease and other diseases of the circulatory system: Secondary | ICD-10-CM

## 2015-12-04 DIAGNOSIS — E785 Hyperlipidemia, unspecified: Secondary | ICD-10-CM | POA: Diagnosis not present

## 2015-12-04 LAB — COMPREHENSIVE METABOLIC PANEL
ALT: 14 U/L (ref 6–29)
AST: 16 U/L (ref 10–35)
Albumin: 4.5 g/dL (ref 3.6–5.1)
Alkaline Phosphatase: 63 U/L (ref 33–130)
BUN: 19 mg/dL (ref 7–25)
CO2: 25 mmol/L (ref 20–31)
Calcium: 9.5 mg/dL (ref 8.6–10.4)
Chloride: 105 mmol/L (ref 98–110)
Creat: 0.89 mg/dL (ref 0.50–1.05)
Glucose, Bld: 100 mg/dL — ABNORMAL HIGH (ref 65–99)
Potassium: 4.1 mmol/L (ref 3.5–5.3)
Sodium: 140 mmol/L (ref 135–146)
Total Bilirubin: 0.7 mg/dL (ref 0.2–1.2)
Total Protein: 6.6 g/dL (ref 6.1–8.1)

## 2015-12-04 LAB — CK: Total CK: 62 U/L (ref 7–177)

## 2015-12-04 NOTE — Telephone Encounter (Signed)
Pt came in for labs this AM. While she was in, she asked if she could have an EKG, because she has not been feeling well since last Friday. Pt states that she has been having a strange sensation in her left upper back. Also on left side of her chest and left shoulder. Pt is aware that we have a new policy of no walk in. Pt does not feels like she is in acute distress. Pt states she just want to have an EKG because she has an inverted T-wave. Pt. stop taken Benadryl last week after taking this medication for 15 years. Pt is having some with symptoms of withdraws, burning, and screeching  sensations on left upper bach and front of chest, and left shoulder. Pt would like to be referred to an EP to see what is going on with the electrical activity of her heart.

## 2015-12-07 ENCOUNTER — Other Ambulatory Visit: Payer: 59

## 2015-12-12 DIAGNOSIS — H524 Presbyopia: Secondary | ICD-10-CM | POA: Diagnosis not present

## 2015-12-20 ENCOUNTER — Encounter: Payer: Self-pay | Admitting: Internal Medicine

## 2015-12-20 ENCOUNTER — Ambulatory Visit (INDEPENDENT_AMBULATORY_CARE_PROVIDER_SITE_OTHER): Payer: 59 | Admitting: Internal Medicine

## 2015-12-20 VITALS — BP 132/84 | HR 63 | Ht 64.0 in | Wt 149.8 lb

## 2015-12-20 DIAGNOSIS — R0602 Shortness of breath: Secondary | ICD-10-CM

## 2015-12-20 DIAGNOSIS — R9431 Abnormal electrocardiogram [ECG] [EKG]: Secondary | ICD-10-CM

## 2015-12-20 NOTE — Progress Notes (Signed)
HPI Emily [name] is self referred today for evaluation of an abnormal ECG. 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 As a young adult she ran over 100 miles a week and has run several marathons. She was seen by Dr. Meda Coffee for an abnormal ECG and found to have antero-lateral T wave abnormalities and ultimately had a CT of the heart which demonstrated no CAD and no coronary calcium. She had atypical chest pain but notes that her biggest complaint is her recent difficulty with exercise. She notes that with any strenuous physical activity she gets sob and feels chest pressure. No syncope. Minimal if any palpitations. Allergies  Allergen Reactions  . Crestor [Rosuvastatin Calcium] Other (See Comments)    Causes calf pain     Current Outpatient Prescriptions  Medication Sig Dispense Refill  . benzonatate (TESSALON PERLES) 100 MG capsule Take 100 mg by mouth 3 (three) times daily as needed for cough.    . Coenzyme Q10 (CO Q-10 MAXIMUM STRENGTH PO) Take 1 tablet by mouth daily.    Marland Kitchen MAGNESIUM PO Take 1 tablet by mouth daily.    . MULTIPLE VITAMIN PO Take 1 tablet by mouth daily.    . Omega-3 Fatty Acids (FISH OIL PO) Take 1 capsule by mouth daily.    Marland Kitchen VITAMIN E PO Take 1 capsule by mouth daily.     No current facility-administered medications for this visit.     Past Medical History  Diagnosis Date  . Elevated cholesterol   . Fx sacrum/coccyx-closed ([place])   . Osteopenia   . Chest tightness   . Fatigue   . Chest pain   . Weight gain   . SOB (shortness of breath) on exertion   . Anxiety and depression   . Pigmented skin lesions   . Fibroid   . Vitamin D deficiency     ROS:   All systems reviewed and negative except as noted in the HPI.   Past Surgical History  Procedure Laterality Date  . No past surgeries       Family History  Problem Relation Age of Onset  . Atrial fibrillation Mother   . Atrial fibrillation Father   . Heart attack Father 59  .  Hypertension Mother   . Hypertension Father   . Hypertension Father   . Stroke Mother      Social History   Social History  . Marital Status: Single    Spouse Name: N/A  . Number of Children: N/A  . Years of Education: N/A   Occupational History  . Not on file.   Social History Main Topics  . Smoking status: Never Smoker   . Smokeless tobacco: Not on file  . Alcohol Use: Yes  . Drug Use: Not on file  . Sexual Activity: Not on file   Other Topics Concern  . Not on file   Social History Narrative     BP 132/84 mmHg  Pulse 63  Ht 5\' 4"  (1.626 m)  Wt 149 lb 12.8 oz (67.949 kg)  BMI 25.70 kg/m2  Physical Exam:  Well appearing middle aged woman, looking younger than her stated age, NAD HEENT: Unremarkable Neck:  6 cm JVD, no thyromegally Lymphatics:  No adenopathy Back:  No CVA tenderness Lungs:  Clear with no wheezes HEART:  Regular rate rhythm, no murmurs, no rubs, no clicks Abd:  soft, positive bowel sounds, no organomegally, no rebound, no guarding Ext:  2  plus pulses, no edema, no cyanosis, no clubbing Skin:  No rashes no nodules Neuro:  CN II through XII intact, motor grossly intact  EKG - nsr with anterolateral T wave inversions.  Assess/Plan: 1. Dyspnea with exertion - the etiology is unclear. I have recommended she undergo cardio-pulmonary stress testing. I suspect she is deconditioned but she could have HFpEF. 2. Abnormal ECG - I have tried to re-assure the patient that despite her abnormal ECG, her risk is very low. 3. Anxiety - this is playing a role but I do not think anxiety is making her sob.   Mikle Bosworth.D.

## 2015-12-20 NOTE — Patient Instructions (Addendum)
Medication Instructions:  Your physician recommends that you continue on your current medications as directed. Please refer to the Current Medication list given to you today.   Labwork: None ordered   Testing/Procedures: Your physician has recommended that you have a cardiopulmonary stress test (CPX). CPX testing is a non-invasive measurement of heart and lung function. It replaces a traditional treadmill stress test. This type of test provides a tremendous amount of information that relates not only to your present condition but also for future outcomes. This test combines measurements of you ventilation, respiratory gas exchange in the lungs, electrocardiogram (EKG), blood pressure and physical response before, during, and following an exercise protocol.     Follow-Up: Your physician recommends that you schedule a follow-up appointment as needed with Dr Lovena Le   Any Other Special Instructions Will Be Listed Below (If Applicable).     If you need a refill on your cardiac medications before your next appointment, please call your pharmacy.

## 2016-01-03 ENCOUNTER — Telehealth: Payer: Self-pay | Admitting: Internal Medicine

## 2016-01-03 NOTE — Telephone Encounter (Signed)
New Message:  Pt called in stating that she wanted to cancel her Cardiopulmonary stress test. Am I sending this to the right Doctor because she said that Dr. Lovena Le ordered this?

## 2016-01-03 NOTE — Telephone Encounter (Signed)
Left message on the machine for patient that I would cancel the test.  She needs to keep a 3 month follow up with Dr Meda Coffee.

## 2016-01-09 ENCOUNTER — Encounter (HOSPITAL_COMMUNITY): Payer: 59

## 2016-01-10 ENCOUNTER — Ambulatory Visit: Payer: 59 | Admitting: Cardiology

## 2016-01-17 ENCOUNTER — Encounter: Payer: Self-pay | Admitting: Cardiology

## 2016-02-17 ENCOUNTER — Emergency Department (HOSPITAL_COMMUNITY)
Admission: EM | Admit: 2016-02-17 | Discharge: 2016-02-17 | Disposition: A | Discharge status: Home / Self Care | Payer: 59 | Attending: Emergency Medicine | Admitting: Emergency Medicine

## 2016-02-17 ENCOUNTER — Encounter (HOSPITAL_COMMUNITY): Payer: Self-pay | Admitting: *Deleted

## 2016-02-17 DIAGNOSIS — F329 Major depressive disorder, single episode, unspecified: Secondary | ICD-10-CM | POA: Diagnosis not present

## 2016-02-17 DIAGNOSIS — L299 Pruritus, unspecified: Secondary | ICD-10-CM | POA: Diagnosis not present

## 2016-02-17 NOTE — ED Notes (Signed)
The pt is c/o something crawling in her hair since yesterday.  No one has been able to see anything  At home

## 2016-02-17 NOTE — ED Provider Notes (Signed)
CSN: [id number]     Arrival date & time 02/17/16  0331 History   First MD Initiated Contact with Patient 02/17/16 [phone]     Chief Complaint  Patient presents with  . something in hair      (Consider location/radiation/quality/duration/timing/severity/associated sxs/prior Treatment) The history is provided by the patient and medical records. No language interpreter was used.    Emily [name] is a 55 y.o. female  with a hx of anxiety, depression, presents to the Emergency Department complaining of gradual, persistent, progressively worsening entire body itching onset several days ago.  She reports the feeling is crawling.  She reports her cat has fleas and there are several healing bites around both ankles.  She has tried detergent, sheets.  No known allergies.  She has also had [employer] come to the house to spray for bugs.  NO aggravating or alleviating symptoms.  Pt reports multiple times "I'm not crazy."  No psyc hx in record review.  Denies SI/HI/auditory or visual hallucinations.   Past Medical History  Diagnosis Date  . Elevated cholesterol   . Fx sacrum/coccyx-closed ([place])   . Osteopenia   . Chest tightness   . Fatigue   . Chest pain   . Weight gain   . SOB (shortness of breath) on exertion   . Anxiety and depression   . Pigmented skin lesions   . Fibroid   . Vitamin D deficiency    Past Surgical History  Procedure Laterality Date  . No past surgeries     Family History  Problem Relation Age of Onset  . Atrial fibrillation Mother   . Atrial fibrillation Father   . Heart attack Father 52  . Hypertension Mother   . Hypertension Father   . Hypertension Father   . Stroke Mother    Social History  Substance Use Topics  . Smoking status: Never Smoker   . Smokeless tobacco: None  . Alcohol Use: Yes   OB History    No data available     Review of Systems  Constitutional: Negative for fever, diaphoresis, appetite change, fatigue and unexpected weight change.   HENT: Negative for mouth sores.   Eyes: Negative for visual disturbance.  Respiratory: Negative for cough, chest tightness, shortness of breath and wheezing.   Cardiovascular: Negative for chest pain.  Gastrointestinal: Negative for nausea, vomiting, abdominal pain, diarrhea and constipation.  Endocrine: Negative for polydipsia, polyphagia and polyuria.  Genitourinary: Negative for dysuria, urgency, frequency and hematuria.  Musculoskeletal: Negative for back pain and neck stiffness.  Skin: Negative for rash.  Allergic/Immunologic: Negative for immunocompromised state.  Neurological: Negative for syncope, light-headedness and headaches.  Hematological: Does not bruise/bleed easily.  Psychiatric/Behavioral: Negative for sleep disturbance. The patient is nervous/anxious.       Allergies  Crestor  Home Medications   Prior to Admission medications   Medication Sig Start Date End Date Taking? Authorizing Provider  benzonatate (TESSALON PERLES) 100 MG capsule Take 100 mg by mouth 3 (three) times daily as needed for cough.    Historical Provider, MD  Coenzyme Q10 (CO Q-10 MAXIMUM STRENGTH PO) Take 1 tablet by mouth daily.    Historical Provider, MD  MAGNESIUM PO Take 1 tablet by mouth daily.    Historical Provider, MD  MULTIPLE VITAMIN PO Take 1 tablet by mouth daily.    Historical Provider, MD  Omega-3 Fatty Acids (FISH OIL PO) Take 1 capsule by mouth daily.    Historical Provider, MD  VITAMIN E PO Take  1 capsule by mouth daily.    Historical Provider, MD   BP 123/88 mmHg  Pulse 88  Temp(Src) 97.6 F (36.4 C) (Oral)  Resp 18  Ht 5\' 4"  (1.626 m)  Wt 68.04 kg  BMI 25.73 kg/m2  SpO2 96% Physical Exam  Constitutional: She is oriented to person, place, and time. She appears well-developed and well-nourished. No distress.  Awake, alert, nontoxic appearance  HENT:  Head: Normocephalic and atraumatic.  Right Ear: Tympanic membrane, external ear and ear canal normal.  Left Ear:  Tympanic membrane, external ear and ear canal normal.  Nose: Nose normal. No mucosal edema or rhinorrhea.  Mouth/Throat: Uvula is midline and oropharynx is clear and moist. No uvula swelling. No oropharyngeal exudate, posterior oropharyngeal edema, posterior oropharyngeal erythema or tonsillar abscesses.  No swelling of the uvula or oropharynx   Eyes: Conjunctivae are normal. No scleral icterus.  Neck: Normal range of motion. Neck supple.  Patent airway No stridor; normal phonation Handling secretions without difficulty  Cardiovascular: Normal rate, regular rhythm, normal heart sounds and intact distal pulses.   No murmur heard. Pulmonary/Chest: Effort normal and breath sounds normal. No stridor. No respiratory distress. She has no wheezes.  Equal chest expansion  Abdominal: Soft. Bowel sounds are normal. She exhibits no mass. There is no tenderness. There is no rebound and no guarding.  Musculoskeletal: Normal range of motion. She exhibits no edema.  Neurological: She is alert and oriented to person, place, and time.  Speech is clear and goal oriented Moves extremities without ataxia  Skin: Skin is warm and dry. Rash noted. She is not diaphoretic.  Small area of erythematous papules noted to the bilateral ankles but no other rash noted to the body Mild excoriations over the skin and scalp - no induration or fluctuance to indicate secondary infection  Psychiatric: She has a normal mood and affect.  Nursing note and vitals reviewed.   ED Course  Procedures (including critical care time)   MDM   Final diagnoses:  Itching   [name] presents with itching over her whole body and sensation of bugs crawling on her. Patient does have a cat which currently has fleas. Small rash noted at her ankles but no other rash anywhere else. Afebrile. No tick bites.  Patient denies any difficulty breathing or swallowing.  Pt has a patent airway without stridor and is handling secretions  without difficulty; no angioedema. No blisters, no pustules, no warmth, no draining sinus tracts, no superficial abscesses, no bullous impetigo, no vesicles, no desquamation, no target lesions with dusky purpura or a central bulla. Not tender to touch. No concern for superimposed infection. No concern for SJS, TEN, TSS, tick borne illness, syphilis or other life-threatening condition. Will discharge home with short course of steroids, pepcid and recommend Benadryl as needed for pruritis.    Jarrett Soho Brissa Asante, PA-C 02/17/16 [place], MD 02/20/16 [phone]

## 2016-02-17 NOTE — Discharge Instructions (Signed)
1. Medications: usual home medications, benadryl for itching 2. Treatment: rest, drink plenty of fluids,  3. Follow Up: Please followup with your primary doctor in 2-3 days for discussion of your diagnoses and further evaluation after today's visit; if you do not have a primary care doctor use the resource guide provided to find one; Please return to the ER for worsening symptoms

## 2016-02-17 NOTE — ED Notes (Signed)
Patient left at this time with all belongings. 

## 2016-02-17 NOTE — ED Notes (Signed)
PA at bedside.

## 2016-02-19 DIAGNOSIS — R202 Paresthesia of skin: Secondary | ICD-10-CM | POA: Diagnosis not present

## 2016-02-19 DIAGNOSIS — R110 Nausea: Secondary | ICD-10-CM | POA: Diagnosis not present

## 2016-02-19 DIAGNOSIS — R197 Diarrhea, unspecified: Secondary | ICD-10-CM | POA: Diagnosis not present

## 2016-02-19 DIAGNOSIS — R5383 Other fatigue: Secondary | ICD-10-CM | POA: Diagnosis not present

## 2016-02-26 DIAGNOSIS — R197 Diarrhea, unspecified: Secondary | ICD-10-CM | POA: Diagnosis not present

## 2016-03-25 DIAGNOSIS — R05 Cough: Secondary | ICD-10-CM | POA: Diagnosis not present

## 2016-03-25 DIAGNOSIS — B370 Candidal stomatitis: Secondary | ICD-10-CM | POA: Diagnosis not present

## 2016-03-25 DIAGNOSIS — J320 Chronic maxillary sinusitis: Secondary | ICD-10-CM | POA: Diagnosis not present

## 2016-03-25 DIAGNOSIS — J410 Simple chronic bronchitis: Secondary | ICD-10-CM | POA: Diagnosis not present

## 2016-03-25 DIAGNOSIS — J322 Chronic ethmoidal sinusitis: Secondary | ICD-10-CM | POA: Diagnosis not present

## 2016-03-25 DIAGNOSIS — J040 Acute laryngitis: Secondary | ICD-10-CM | POA: Diagnosis not present

## 2016-03-25 MED FILL — CEFUROXIME AXETIL 250 MG TA: 250 | 10 days supply | Qty: 20 | Fill #0

## 2016-03-25 MED FILL — FLUCONAZOLE 150 MG TABLET: 150 | 10 days supply | Qty: 3 | Fill #0

## 2016-04-01 ENCOUNTER — Encounter (HOSPITAL_COMMUNITY): Payer: Self-pay | Admitting: Emergency Medicine

## 2016-04-01 ENCOUNTER — Ambulatory Visit (HOSPITAL_COMMUNITY)
Admission: EM | Admit: 2016-04-01 | Discharge: 2016-04-01 | Disposition: A | Discharge status: Home / Self Care | Payer: 59 | Attending: Family Medicine | Admitting: Family Medicine

## 2016-04-01 DIAGNOSIS — J029 Acute pharyngitis, unspecified: Secondary | ICD-10-CM

## 2016-04-01 MED ORDER — FIRST-DUKES MOUTHWASH MT SUSP: 10.0000 mL | Freq: Four times a day (QID) | OROMUCOSAL | 1 refills | Status: DC | PRN

## 2016-04-01 NOTE — Discharge Instructions (Signed)
Use medicine as prescribed, see your doctor as planned.

## 2016-04-01 NOTE — ED Provider Notes (Addendum)
[place]    CSN: [id number] Arrival date & time: 04/01/16  1016  First Provider Contact:  First MD Initiated Contact with Patient 04/01/16 1144        History   Chief Complaint Chief Complaint  Patient presents with  . Oral Swelling    HPI Emily [name] is a 55 y.o. female.   The history is provided by the patient.  Mouth Lesions  Location:  Posterior pharynx Quality:  White Onset quality:  Gradual Severity:  Mild Progression:  Unchanged Chronicity:  Recurrent Context: stress   Context comment:  Ongoing worry and concern about mold in her house, possible re exposure this am, feels like angel hair in throat..seen by ent and improved with abx. Associated symptoms: sore throat   Associated symptoms: no fever and no rhinorrhea   Associated symptoms comment:  No sinus drainage.   Past Medical History:  Diagnosis Date  . Anxiety and depression   . Chest pain   . Chest tightness   . Elevated cholesterol   . Fatigue   . Fibroid   . Fx sacrum/coccyx-closed ([place])   . Osteopenia   . Pigmented skin lesions   . SOB (shortness of breath) on exertion   . Vitamin D deficiency   . Weight gain     Patient Active Problem List   Diagnosis Date Noted  . Hyperlipidemia 07/16/2015  . Abnormal EKG 07/16/2015  . Family history of premature CAD 07/16/2015  . Elevated cholesterol   . Fx sacrum/coccyx-closed ([place])   . Osteopenia   . Chest tightness   . Chest pain with moderate risk of acute coronary syndrome   . Weight gain   . SOB (shortness of breath) on exertion   . Anxiety and depression   . Pigmented skin lesions   . Fibroid   . Vitamin D deficiency     Past Surgical History:  Procedure Laterality Date  . NO PAST SURGERIES      OB History    No data available       Home Medications    Prior to Admission medications   Medication Sig Start Date End Date Taking? Authorizing Provider  benzonatate (TESSALON PERLES) 100 MG capsule Take 100 mg  by mouth 3 (three) times daily as needed for cough.    Historical Provider, MD  Coenzyme Q10 (CO Q-10 MAXIMUM STRENGTH PO) Take 1 tablet by mouth daily.    Historical Provider, MD  Diphenhyd-Hydrocort-Nystatin (FIRST-DUKES MOUTHWASH) SUSP Use as directed 10 mLs in the mouth or throat 4 (four) times daily as needed. Gargle and spit 04/01/16   Billy Fischer, MD  MAGNESIUM PO Take 1 tablet by mouth daily.    Historical Provider, MD  MULTIPLE VITAMIN PO Take 1 tablet by mouth daily.    Historical Provider, MD  Omega-3 Fatty Acids (FISH OIL PO) Take 1 capsule by mouth daily.    Historical Provider, MD  VITAMIN E PO Take 1 capsule by mouth daily.    Historical Provider, MD    Family History Family History  Problem Relation Age of Onset  . Atrial fibrillation Mother   . Hypertension Mother   . Stroke Mother   . Atrial fibrillation Father   . Heart attack Father 72  . Hypertension Father     Social History Social History  Substance Use Topics  . Smoking status: Never Smoker  . Smokeless tobacco: Never Used  . Alcohol use Yes     Allergies   Crestor [  rosuvastatin calcium]   Review of Systems Review of Systems  Constitutional: Negative for fever.  HENT: Positive for mouth sores, sore throat and trouble swallowing. Negative for postnasal drip and rhinorrhea.   Respiratory: Negative.   Cardiovascular: Negative.   All other systems reviewed and are negative.    Physical Exam Triage Vital Signs ED Triage Vitals [04/01/16 1120]  Enc Vitals Group     BP 131/81     Pulse Rate 61     Resp 16     Temp 97.7 F (36.5 C)     Temp Source Oral     SpO2 97 %     Weight      Height      Head Circumference      Peak Flow      Pain Score      Pain Loc      Pain Edu?      Excl. in [place]?    No data found.   Updated Vital Signs BP 131/81   Pulse 61   Temp 97.7 F (36.5 C) (Oral)   Resp 16   SpO2 97%   Visual Acuity Right Eye Distance:   Left Eye Distance:   Bilateral  Distance:    Right Eye Near:   Left Eye Near:    Bilateral Near:     Physical Exam  Constitutional: She is oriented to person, place, and time. She appears well-developed and well-nourished. She appears distressed.  HENT:  Nose: Nose normal.  Mouth/Throat: Oropharynx is clear and moist.  Eyes: EOM are normal. Pupils are equal, round, and reactive to light.  Neck: Normal range of motion. Neck supple.  Cardiovascular: Normal rate, regular rhythm and normal heart sounds.   Pulmonary/Chest: Effort normal and breath sounds normal. No respiratory distress. She has no wheezes.  Lymphadenopathy:    She has no cervical adenopathy.  Neurological: She is alert and oriented to person, place, and time.  Skin: Skin is warm and dry.  Nursing note and vitals reviewed.    UC Treatments / Results  Labs (all labs ordered are listed, but only abnormal results are displayed) Labs Reviewed - No data to display  EKG  EKG Interpretation None       Radiology No results found.  Procedures Procedures (including critical care time)  Medications Ordered in UC Medications - No data to display   Initial Impression / Assessment and Plan / UC Course  I have reviewed the triage vital signs and the nursing notes.  Pertinent labs & imaging results that were available during my care of the patient were reviewed by me and considered in my medical decision making (see chart for details).  Clinical Course      Final Clinical Impressions(s) / UC Diagnoses   Final diagnoses:  Acute pharyngitis, unspecified pharyngitis type    New Prescriptions Discharge Medication List as of 04/01/2016 12:04 PM    START taking these medications   Details  Diphenhyd-Hydrocort-Nystatin (FIRST-DUKES MOUTHWASH) SUSP Use as directed 10 mLs in the mouth or throat 4 (four) times daily as needed. Gargle and spit, Starting Tue 04/01/2016, Print         Billy Fischer, MD 04/15/16 [place],  MD 04/15/16 [phone]

## 2016-04-01 NOTE — ED Triage Notes (Signed)
PT reports she has had issues with her AC.  She has been out of her house for four weeks due to mold and "white powder" from system. Every time PT is exposed, her throat feels thick and tightened. PT reports she feels like she has "lint" in her nose and throat. Throat does appear to be tight. Did not take benadryl. Started at [facility]

## 2016-04-15 DIAGNOSIS — J322 Chronic ethmoidal sinusitis: Secondary | ICD-10-CM | POA: Diagnosis not present

## 2016-04-15 DIAGNOSIS — J301 Allergic rhinitis due to pollen: Secondary | ICD-10-CM | POA: Diagnosis not present

## 2016-04-15 DIAGNOSIS — J3081 Allergic rhinitis due to animal (cat) (dog) hair and dander: Secondary | ICD-10-CM | POA: Diagnosis not present

## 2016-04-15 DIAGNOSIS — J3501 Chronic tonsillitis: Secondary | ICD-10-CM | POA: Diagnosis not present

## 2016-04-15 DIAGNOSIS — J320 Chronic maxillary sinusitis: Secondary | ICD-10-CM | POA: Diagnosis not present

## 2016-04-23 ENCOUNTER — Encounter: Payer: Self-pay | Admitting: Allergy and Immunology

## 2016-04-23 ENCOUNTER — Ambulatory Visit (INDEPENDENT_AMBULATORY_CARE_PROVIDER_SITE_OTHER): Payer: 59 | Admitting: Allergy and Immunology

## 2016-04-23 VITALS — BP 120/78 | HR 80 | Temp 98.0°F | Resp 16 | Ht 63.98 in | Wt 141.4 lb

## 2016-04-23 DIAGNOSIS — R110 Nausea: Secondary | ICD-10-CM | POA: Diagnosis not present

## 2016-04-23 DIAGNOSIS — J309 Allergic rhinitis, unspecified: Secondary | ICD-10-CM

## 2016-04-23 DIAGNOSIS — L299 Pruritus, unspecified: Secondary | ICD-10-CM | POA: Diagnosis not present

## 2016-04-23 DIAGNOSIS — H1010 Acute atopic conjunctivitis, unspecified eye: Secondary | ICD-10-CM

## 2016-04-23 DIAGNOSIS — J329 Chronic sinusitis, unspecified: Secondary | ICD-10-CM

## 2016-04-23 MED ORDER — MONTELUKAST SODIUM 10 MG PO TABS: 10.0000 mg | ORAL_TABLET | Freq: Every day | ORAL | 5 refills | Status: DC

## 2016-04-23 MED FILL — MONTELUKAST SOD 10 MG TAB: 10 | 30 days supply | Qty: 30 | Fill #0

## 2016-04-23 NOTE — Patient Instructions (Addendum)
  1. Allergen avoidance measures?  2. Investigation:   A. limited coronal sinus CT scan  B. urease breath test  C. Blood - CBC with differential, TSH, T4, TP, sed, CRP, UA  3. Treatment:   A. OTC Rhinocort one spray each nostril one time per day. Coupon. Sample  B. montelukast 10 mg one tablet one time per day  4. If needed:   A. Antihistamine - OTC Zyrtec/Claritin/Allegra  5. Therapy for reflux? Caffeine/chocolate/alcohol consumption?  6. Return to clinic in 3 weeks or earlier if problem

## 2016-04-23 NOTE — Progress Notes (Signed)
NEW PATIENT NOTE  Referring Provider: No ref. provider found Primary Provider: Jonathon Bellows, MD Date of office visit: 04/23/2016    Subjective:   Chief Complaint:  Emily [name] (DOB: 1960/10/08) is a 55 y.o. female with a chief complaint of Nasal Congestion  who presents to the clinic on 04/23/2016 with the following problems:  HPI: Emily [name] presents to this clinic in evaluation of problems that developed around Father's Day of this year. Apparently she had some type of humidity problem develop within her very old 1920s house that may have caused some mold growth in the basement. However, it should be noted that there is always been some degree of mold in the basement. There is a crawl space that does not have any plastic laying on top of the dirt. She believes that there was some type of material inside the  Air vents. She had her vents cleaned. She is now out of the house for the past 6 weeks and she is not going to go back into the house and she is going to sell her house.  Her problem is manifested as a multitude of different symptoms. She states that she has had issues with nasal congestion and feeling as though there is something stuck inside her airway and she feels as though there is something stuck in her throat and she has lots of throat clearing and she has to use a lozenge throughout the day to soothe her throat. She's been having intermittent raspy voice. In addition, she's been having "bumps on her tongue and throat". She has seen Dr. Ernesto Rutherford who started her on antibiotic for possible sinusitis this week. She's also had problems with nausea and has lost some weight. She has this problem with a crawly feeling across her skin like she has "angel hair" on her skin. All these symptoms appear to be worse when being exposed to the house intermittently but they also appear to occur even without exposure at this point.  Emily [name] is convinced that she has been exposed to some type of mold  with inside the household and she has sought out counseling with an environmental specialist who informed her that indeed there is a mold problem in the house.  Emily [name] has also had a problem with a chest pain issue that required evaluation in 2016. There was apparently no cardiac origin for this issue. She does not have any classic GI symptoms suggesting reflux.  Past Medical History:  Diagnosis Date  . Anxiety and depression   . Chest pain   . Chest tightness   . Elevated cholesterol   . Fatigue   . Fibroid   . Fx sacrum/coccyx-closed ([place])   . Osteopenia   . Pigmented skin lesions   . SOB (shortness of breath) on exertion   . Vitamin D deficiency   . Weight gain     Past Surgical History:  Procedure Laterality Date  . ADENOIDECTOMY    . TONSILLECTOMY        Medication List      fexofenadine-pseudoephedrine 60-120 MG 12 hr tablet Commonly known as:  ALLEGRA-D Take 1 tablet by mouth daily as needed.   PROBIOTIC PO Take by mouth 2 (two) times daily. Lactenex   VITAMIN D PO Take by mouth.       Allergies  Allergen Reactions  . Crestor [Rosuvastatin Calcium] Other (See Comments)    Causes calf pain  . Latex Other (See Comments)    Breaks out skin  Review of systems negative except as noted in HPI / PMHx or noted below:  Review of Systems  Constitutional: Negative.   HENT: Negative.   Eyes: Negative.   Respiratory: Negative.   Cardiovascular: Negative.   Gastrointestinal: Negative.   Genitourinary: Negative.   Musculoskeletal: Negative.   Skin: Negative.   Neurological: Negative.   Endo/Heme/Allergies: Negative.   Psychiatric/Behavioral: Negative.     Family History  Problem Relation Age of Onset  . Atrial fibrillation Mother   . Hypertension Mother   . Stroke Mother   . Arthritis Mother   . Atrial fibrillation Father   . Heart attack Father 39  . Hypertension Father   . COPD Father   . Diabetes Father     Social History   Social History    . Marital status: Single    Spouse name: N/A  . Number of children: N/A  . Years of education: N/A   Occupational History  . Not on file.   Social History Main Topics  . Smoking status: Never Smoker  . Smokeless tobacco: Never Used  . Alcohol use Yes  . Drug use: Unknown  . Sexual activity: Not on file   Other Topics Concern  . Not on file   Social History Narrative  . No narrative on file    Environmental and Social history  Lives in her sister's house which at this point in time does not appear to have any issues with mold exposure, hardwood in the bedroom, no animals located inside the household, no plastic on the better pillow, and no smokers located inside the household. She is an Therapist, sports.   Objective:   Vitals:   04/23/16 0850  BP: 120/78  Pulse: 80  Resp: 16  Temp: 98 F (36.7 C)   Height: 5' 3.98" (162.5 cm) Weight: 141 lb 6.4 oz (64.1 kg)  Physical Exam  Constitutional: She is well-developed, well-nourished, and in no distress.  HENT:  Head: Normocephalic. Head is without right periorbital erythema and without left periorbital erythema.  Right Ear: Tympanic membrane, external ear and ear canal normal.  Left Ear: Tympanic membrane, external ear and ear canal normal.  Nose: Nose normal. No mucosal edema or rhinorrhea.  Mouth/Throat: Oropharynx is clear and moist and mucous membranes are normal. No oropharyngeal exudate.  Eyes: Conjunctivae and lids are normal. Pupils are equal, round, and reactive to light.  Neck: Trachea normal. No tracheal deviation present. No thyromegaly present.  Cardiovascular: Normal rate, regular rhythm, S1 normal, S2 normal and normal heart sounds.   No murmur heard. Pulmonary/Chest: Effort normal. No stridor. No tachypnea. No respiratory distress. She has no wheezes. She has no rales. She exhibits no tenderness.  Abdominal: Soft. She exhibits no distension and no mass. There is no hepatosplenomegaly. There is no tenderness. There is  no rebound and no guarding.  Musculoskeletal: She exhibits no edema or tenderness.  Lymphadenopathy:       Head (right side): No tonsillar adenopathy present.       Head (left side): No tonsillar adenopathy present.    She has no cervical adenopathy.    She has no axillary adenopathy.  Neurological: She is alert. Gait normal.  Skin: No rash noted. She is not diaphoretic. No erythema. No pallor. Nails show no clubbing.  Psychiatric: Mood and affect normal.     Diagnostics: Allergy skin tests were performed. She demonstrated hypersensitivity against grasses, weeds, and trees.    Assessment and Plan:    1. Allergic rhinoconjunctivitis  2. Pruritic disorder   3. Nausea   4. Chronic sinusitis, unspecified location     1. Allergen avoidance measures  2. Investigation:   A. limited coronal sinus CT scan  B. urease breath test  C. Blood - CBC with differential, TSH, T4, TP, sed, CRP, UA  3. Treatment:   A. OTC Rhinocort one spray each nostril one time per day. Coupon. Sample  B. montelukast 10 mg one tablet one time per day  4. If needed:   A. Antihistamine - OTC Zyrtec/Claritin/Allegra  5. Therapy for reflux? Caffeine/chocolate/alcohol consumption?  6. Return to clinic in 3 weeks or earlier if problem  It is not entirely clear what is going on with Emily [name] regarding her respiratory tract symptoms and her intermittent nausea and her pruritic condition. Certainly this could all be explained by some type of systemic disease such as Helicobacter pylori infection giving rise to immunological hyperreactivity plus some atopic respiratory disease. There is also the possibility that she may have a component of LPR contributing to some of her laryngeal and mid airway symptoms. I think the best way to approach this issue is to have her undergo investigation to see if indeed she does have a component of chronic sinusitis or Helicobacter pylori infection or evidence of a systemic disease  contributing to her immunological hyperreactivity. We'll get her to perform allergen avoidance measures as best as possible and consistently use anti-inflammatory medications for her respiratory tract as noted above. I'll regroup with her in 3 weeks at which time we should have all the evidence available to review regarding her investigation and will make a determination about how to proceed pending her response.  Jiles Prows, MD [facility]

## 2016-04-28 ENCOUNTER — Other Ambulatory Visit: Payer: 59

## 2016-04-29 ENCOUNTER — Ambulatory Visit
Admission: RE | Admit: 2016-04-29 | Discharge: 2016-04-29 | Disposition: A | Discharge status: Home / Self Care | Payer: 59 | Source: Ambulatory Visit | Attending: Allergy and Immunology | Admitting: Allergy and Immunology

## 2016-04-29 DIAGNOSIS — J329 Chronic sinusitis, unspecified: Secondary | ICD-10-CM | POA: Diagnosis not present

## 2016-05-05 ENCOUNTER — Encounter: Payer: Self-pay | Admitting: *Deleted

## 2016-05-21 ENCOUNTER — Other Ambulatory Visit: Payer: Self-pay

## 2016-05-21 ENCOUNTER — Other Ambulatory Visit: Payer: Self-pay | Admitting: Allergy and Immunology

## 2016-05-21 DIAGNOSIS — H1010 Acute atopic conjunctivitis, unspecified eye: Secondary | ICD-10-CM

## 2016-05-21 DIAGNOSIS — J309 Allergic rhinitis, unspecified: Principal | ICD-10-CM

## 2016-05-27 ENCOUNTER — Ambulatory Visit: Payer: 59 | Admitting: Allergy and Immunology

## 2016-06-02 DIAGNOSIS — R5383 Other fatigue: Secondary | ICD-10-CM | POA: Diagnosis not present

## 2016-06-02 DIAGNOSIS — F458 Other somatoform disorders: Secondary | ICD-10-CM | POA: Diagnosis not present

## 2016-06-02 DIAGNOSIS — M1990 Unspecified osteoarthritis, unspecified site: Secondary | ICD-10-CM | POA: Diagnosis not present

## 2016-06-02 DIAGNOSIS — F418 Other specified anxiety disorders: Secondary | ICD-10-CM | POA: Diagnosis not present

## 2016-06-02 DIAGNOSIS — R399 Unspecified symptoms and signs involving the genitourinary system: Secondary | ICD-10-CM | POA: Diagnosis not present

## 2016-06-02 DIAGNOSIS — Z9109 Other allergy status, other than to drugs and biological substances: Secondary | ICD-10-CM | POA: Diagnosis not present

## 2016-06-02 DIAGNOSIS — L299 Pruritus, unspecified: Secondary | ICD-10-CM | POA: Diagnosis not present

## 2016-06-10 ENCOUNTER — Encounter: Payer: Self-pay | Admitting: *Deleted

## 2016-07-08 ENCOUNTER — Ambulatory Visit: Payer: 59 | Admitting: Allergy and Immunology

## 2016-08-06 MED FILL — MONTELUKAST SOD 10 MG TAB: 10 | 30 days supply | Qty: 30 | Fill #1

## 2017-03-03 DIAGNOSIS — J029 Acute pharyngitis, unspecified: Secondary | ICD-10-CM | POA: Diagnosis not present

## 2017-03-03 DIAGNOSIS — J322 Chronic ethmoidal sinusitis: Secondary | ICD-10-CM | POA: Diagnosis not present

## 2017-03-03 DIAGNOSIS — J320 Chronic maxillary sinusitis: Secondary | ICD-10-CM | POA: Diagnosis not present

## 2017-03-03 DIAGNOSIS — J040 Acute laryngitis: Secondary | ICD-10-CM | POA: Diagnosis not present

## 2017-03-03 MED FILL — CEFUROXIME AXETIL 250 MG TA: 250 | 10 days supply | Qty: 20 | Fill #0

## 2017-03-03 MED FILL — FLUCONAZOLE 100 MG TABLET: 100 | 10 days supply | Qty: 3 | Fill #0

## 2017-03-03 MED FILL — LACTINEX CHEWABLE TABLET: 25 days supply | Qty: 50 | Fill #0

## 2017-03-17 DIAGNOSIS — J310 Chronic rhinitis: Secondary | ICD-10-CM | POA: Diagnosis not present

## 2017-03-20 DIAGNOSIS — R1013 Epigastric pain: Secondary | ICD-10-CM | POA: Diagnosis not present

## 2017-03-24 ENCOUNTER — Ambulatory Visit (INDEPENDENT_AMBULATORY_CARE_PROVIDER_SITE_OTHER): Payer: 59 | Admitting: Allergy and Immunology

## 2017-03-24 ENCOUNTER — Encounter: Payer: Self-pay | Admitting: Allergy and Immunology

## 2017-03-24 VITALS — BP 126/74 | HR 72 | Resp 16

## 2017-03-24 DIAGNOSIS — K2900 Acute gastritis without bleeding: Secondary | ICD-10-CM | POA: Diagnosis not present

## 2017-03-24 DIAGNOSIS — J310 Chronic rhinitis: Secondary | ICD-10-CM | POA: Diagnosis not present

## 2017-03-24 DIAGNOSIS — R1010 Upper abdominal pain, unspecified: Secondary | ICD-10-CM | POA: Diagnosis not present

## 2017-03-24 DIAGNOSIS — R768 Other specified abnormal immunological findings in serum: Secondary | ICD-10-CM

## 2017-03-24 DIAGNOSIS — J3089 Other allergic rhinitis: Secondary | ICD-10-CM

## 2017-03-24 DIAGNOSIS — Z1211 Encounter for screening for malignant neoplasm of colon: Secondary | ICD-10-CM | POA: Diagnosis not present

## 2017-03-24 MED ORDER — MUPIROCIN CALCIUM 2 % NA OINT: 1.0000 "application " | TOPICAL_OINTMENT | Freq: Three times a day (TID) | NASAL | 0 refills | Status: DC

## 2017-03-24 MED FILL — MUPIROCIN 2% OINTMENT: 2 | 10 days supply | Qty: 22 | Fill #0
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
## 2017-03-24 NOTE — Patient Instructions (Addendum)
  1. Bactroban ointment applied inside nose 3 times per day for the next week  2. Prednisone 10 mg a day for the next 4 days only  3. Continue evaluation with gastroenterologist including upper endoscopy and colonoscopy  4. Can continue OTC antihistamine   5. Can continue OTC nasal saline if needed  6. Repeat serum IgG level  7. Further evaluation treatment?

## 2017-03-26 MED FILL — GAVILYTE-N SOLUTION: 420 | 1 days supply | Qty: 4000 | Fill #0

## 2017-03-29 ENCOUNTER — Encounter (HOSPITAL_COMMUNITY): Payer: Self-pay | Admitting: Emergency Medicine

## 2017-03-29 ENCOUNTER — Emergency Department (HOSPITAL_COMMUNITY)
Admission: EM | Admit: 2017-03-29 | Discharge: 2017-03-30 | Disposition: A | Discharge status: Home / Self Care | Payer: 59 | Attending: Emergency Medicine | Admitting: Emergency Medicine

## 2017-03-29 DIAGNOSIS — R1013 Epigastric pain: Secondary | ICD-10-CM | POA: Insufficient documentation

## 2017-03-29 DIAGNOSIS — R197 Diarrhea, unspecified: Secondary | ICD-10-CM | POA: Insufficient documentation

## 2017-03-29 DIAGNOSIS — R0602 Shortness of breath: Secondary | ICD-10-CM | POA: Diagnosis not present

## 2017-03-29 LAB — LIPASE, BLOOD: Lipase: 16 U/L (ref 11–51)

## 2017-03-29 LAB — COMPREHENSIVE METABOLIC PANEL
ALT: 18 U/L (ref 14–54)
AST: 21 U/L (ref 15–41)
Albumin: 4.5 g/dL (ref 3.5–5.0)
Alkaline Phosphatase: 60 U/L (ref 38–126)
Anion gap: 10 (ref 5–15)
BUN: 12 mg/dL (ref 6–20)
CO2: 25 mmol/L (ref 22–32)
Calcium: 9.4 mg/dL (ref 8.9–10.3)
Chloride: 99 mmol/L — ABNORMAL LOW (ref 101–111)
Creatinine, Ser: 0.76 mg/dL (ref 0.44–1.00)
GFR calc Af Amer: >60 mL/min (ref >60)
GFR calc non Af Amer: >60 mL/min (ref >60)
Glucose, Bld: 100 mg/dL — ABNORMAL HIGH (ref 65–99)
Potassium: 3.8 mmol/L (ref 3.5–5.1)
Sodium: 134 mmol/L — ABNORMAL LOW (ref 135–145)
Total Bilirubin: 0.9 mg/dL (ref 0.3–1.2)
Total Protein: 7.3 g/dL (ref 6.5–8.1)

## 2017-03-29 LAB — CBC
HCT: 36.4 % (ref 36.0–46.0)
Hemoglobin: 12.8 g/dL (ref 12.0–15.0)
MCH: 28.8 pg (ref 26.0–34.0)
MCHC: 35.2 g/dL (ref 30.0–36.0)
MCV: 82 fL (ref 78.0–100.0)
Platelets: 227 K/uL (ref 150–400)
RBC: 4.44 MIL/uL (ref 3.87–5.11)
RDW: 12.8 % (ref 11.5–15.5)
WBC: 9.7 K/uL (ref 4.0–10.5)
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
## 2017-03-30 ENCOUNTER — Emergency Department (HOSPITAL_COMMUNITY): Payer: 59

## 2017-03-30 DIAGNOSIS — K6289 Other specified diseases of anus and rectum: Secondary | ICD-10-CM | POA: Diagnosis not present

## 2017-03-30 DIAGNOSIS — R0602 Shortness of breath: Secondary | ICD-10-CM | POA: Diagnosis not present

## 2017-03-30 DIAGNOSIS — K219 Gastro-esophageal reflux disease without esophagitis: Secondary | ICD-10-CM | POA: Diagnosis not present

## 2017-03-30 DIAGNOSIS — D126 Benign neoplasm of colon, unspecified: Secondary | ICD-10-CM | POA: Diagnosis not present

## 2017-03-30 DIAGNOSIS — R1013 Epigastric pain: Secondary | ICD-10-CM | POA: Diagnosis not present

## 2017-03-30 DIAGNOSIS — K635 Polyp of colon: Secondary | ICD-10-CM | POA: Diagnosis not present

## 2017-03-30 DIAGNOSIS — Z1211 Encounter for screening for malignant neoplasm of colon: Secondary | ICD-10-CM | POA: Diagnosis not present

## 2017-03-30 DIAGNOSIS — D175 Benign lipomatous neoplasm of intra-abdominal organs: Secondary | ICD-10-CM | POA: Diagnosis not present

## 2017-03-30 DIAGNOSIS — K2930 Chronic superficial gastritis without bleeding: Secondary | ICD-10-CM | POA: Diagnosis not present

## 2017-03-30 LAB — URINALYSIS, ROUTINE W REFLEX MICROSCOPIC
Bacteria, UA: NONE SEEN
Bilirubin Urine: NEGATIVE
Glucose, UA: NEGATIVE mg/dL
Ketones, ur: 5 mg/dL — AB
Leukocytes, UA: NEGATIVE
Nitrite: NEGATIVE
Protein, ur: NEGATIVE mg/dL
Specific Gravity, Urine: 1.001 — ABNORMAL LOW (ref 1.005–1.030)
Squamous Epithelial / LPF: NONE SEEN
pH: 6 (ref 5.0–8.0)

## 2017-03-30 MED ORDER — ALBUTEROL SULFATE HFA 108 (90 BASE) MCG/ACT IN AERS
2.0000 | INHALATION_SPRAY | RESPIRATORY_TRACT | Status: DC | PRN
  Administered 2017-03-30: 2 via RESPIRATORY_TRACT
  Filled 2017-03-30: qty 6.7
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
## 2017-04-06 ENCOUNTER — Telehealth: Payer: Self-pay | Admitting: Allergy and Immunology

## 2017-04-06 NOTE — Telephone Encounter (Signed)
Pt called and would like to talk with a nurse about some blood work for mold [phone].

## 2017-04-06 NOTE — Telephone Encounter (Signed)
Pt would would like to be blood tested for mold toxicity. Please advise

## 2017-04-09 ENCOUNTER — Telehealth: Payer: Self-pay | Admitting: Pulmonary Disease

## 2017-04-09 ENCOUNTER — Ambulatory Visit (INDEPENDENT_AMBULATORY_CARE_PROVIDER_SITE_OTHER): Payer: 59 | Admitting: Pulmonary Disease

## 2017-04-09 ENCOUNTER — Encounter: Payer: Self-pay | Admitting: Pulmonary Disease

## 2017-04-09 VITALS — BP 110/70 | HR 60 | Ht 64.0 in | Wt 146.4 lb

## 2017-04-09 DIAGNOSIS — R059 Cough, unspecified: Secondary | ICD-10-CM

## 2017-04-09 DIAGNOSIS — R0602 Shortness of breath: Secondary | ICD-10-CM | POA: Diagnosis not present

## 2017-04-09 DIAGNOSIS — M950 Acquired deformity of nose: Secondary | ICD-10-CM

## 2017-04-09 DIAGNOSIS — R05 Cough: Secondary | ICD-10-CM

## 2017-04-09 LAB — PULMONARY FUNCTION TEST
DL/VA % pred: 96 %
DL/VA: 4.75 ml/min/mmHg/L
DLCO cor % pred: 92 %
DLCO cor: 23.74 ml/min/mmHg
DLCO unc % pred: 91 %
DLCO unc: 23.51 ml/min/mmHg
FEF 25-75 Post: 4.31 L/sec
FEF 25-75 Pre: 4.15 L/sec
FEF2575-%Change-Post: 3 %
FEF2575-%Pred-Post: 168 %
FEF2575-%Pred-Pre: 162 %
FEV1-%Change-Post: 0 %
FEV1-%Pred-Post: 110 %
FEV1-%Pred-Pre: 110 %
FEV1-Post: 3.03 L
FEV1-Pre: 3.02 L
FEV1FVC-%Change-Post: 0 %
FEV1FVC-%Pred-Pre: 109 %
FEV6-%Change-Post: 0 %
FEV6-%Pred-Post: 101 %
FEV6-%Pred-Pre: 102 %
FEV6-Post: 3.47 L
FEV6-Pre: 3.49 L
FEV6FVC-%Change-Post: 0 %
FEV6FVC-%Pred-Post: 102 %
FEV6FVC-%Pred-Pre: 103 %
FVC-%Change-Post: 0 %
FVC-%Pred-Post: 98 %
FVC-%Pred-Pre: 99 %
FVC-Post: 3.48 L
FVC-Pre: 3.49 L
Post FEV1/FVC ratio: 87 %
Post FEV6/FVC ratio: 100 %
Pre FEV1/FVC ratio: 87 %
Pre FEV6/FVC Ratio: 100 %
RV % pred: 81 %
RV: 1.6 L
TLC % pred: 98 %
TLC: 5.11 L

## 2017-04-09 NOTE — Progress Notes (Signed)
PFT done today. 

## 2017-04-09 NOTE — Progress Notes (Signed)
   Subjective:    Patient ID: Tanja Port, female    DOB: 1961-08-02, 56 y.o.   MRN: [id number]  HPI    Review of Systems  Constitutional: Negative for fever and unexpected weight change.  HENT: Positive for voice change. Negative for dental problem, ear pain, nosebleeds, postnasal drip, rhinorrhea, sinus pressure, sneezing, sore throat and trouble swallowing.        Airway changes and obstruction  Eyes: Negative for redness and itching.  Respiratory: Positive for cough, shortness of breath and wheezing. Negative for chest tightness.        Airway changes and obstruction  Cardiovascular: Negative for palpitations and leg swelling.  Gastrointestinal: Negative for nausea and vomiting.  Genitourinary: Negative for dysuria.  Musculoskeletal: Negative for joint swelling.  Skin: Negative for rash.  Neurological: Negative for headaches.  Hematological: Does not bruise/bleed easily.  Psychiatric/Behavioral: Negative for dysphoric mood. The patient is not nervous/anxious.        Objective:   Physical Exam        Assessment & Plan:

## 2017-04-09 NOTE — Telephone Encounter (Signed)
PFT 04/09/17 >> FEV1 3.03 (110%), FEV1% 87, TLC 5.11 (98%), DLCO 91%, no BD   Please let her know her breathing test was completely normal.

## 2017-04-09 NOTE — Patient Instructions (Signed)
Will arrange for CT sinus and chest Will arrange for pulmonary function test  Follow up in 8 weeks

## 2017-04-09 NOTE — Progress Notes (Signed)
Past surgical history She  has a past surgical history that includes Adenoidectomy and Tonsillectomy.  Family history Her family history includes Arthritis in her mother; Atrial fibrillation in her father and mother; COPD in her father; Diabetes in her father; Heart attack (age of onset: 38) in her father; Hypertension in her father and mother; Stroke in her mother.  Social history She  reports that she has never smoked. She has never used smokeless tobacco. She reports that she drinks alcohol. She reports that she does not use drugs.  Allergies  Allergen Reactions  . Crestor [Rosuvastatin Calcium] Other (See Comments)    Causes calf pain  . Latex Other (See Comments)    Breaks out skin   Review of Systems  Constitutional: Negative for fever and unexpected weight change.  HENT: Positive for voice change. Negative for dental problem, ear pain, nosebleeds, postnasal drip, rhinorrhea, sinus pressure, sneezing, sore throat and trouble swallowing.        Airway changes and obstruction  Eyes: Negative for redness and itching.  Respiratory: Positive for cough, shortness of breath and wheezing. Negative for chest tightness.        Airway changes and obstruction  Cardiovascular: Negative for palpitations and leg swelling.  Gastrointestinal: Negative for nausea and vomiting.  Genitourinary: Negative for dysuria.  Musculoskeletal: Negative for joint swelling.  Skin: Negative for rash.  Neurological: Negative for headaches.  Hematological: Does not bruise/bleed easily.  Psychiatric/Behavioral: Negative for dysphoric mood. The patient is not nervous/anxious.    Current Outpatient Prescriptions on File Prior to Visit  Medication Sig  . lactobacillus acidophilus & bulgar (LACTINEX) chewable tablet CHEW 1 TABLET BY MOUTH 2 TIMES A DAY  . mupirocin nasal ointment (BACTROBAN NASAL) 2 % Place 1 application into the nose 3 (three) times daily.   No current facility-administered medications on file  prior to visit.     Chief Complaint  Patient presents with  . PULMONARY CONSULT    Self Referral-Mold and asbestos exposure, hoarseness x 1 year. Pt states that she used a respirator mask. Pt states that her last exposure was with a bug spray bomb and she states that this burned her airways and since her breathing has been worse and her right nasal passage is closed. Pt took course of Prednisone, completed x 1-2 week ago.    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  Vital signs BP 110/70 (BP Location: Left Arm, Cuff Size: Normal)   Pulse 60   Ht 5\' 4"  (1.626 m)   Wt 146 lb 6.4 oz (66.4 kg)   SpO2 96%   BMI 25.13 kg/m   History of present illness Emily [name] is a 56 y.o. female with dyspnea.  She works as a Marine scientist.  She has been seen previously by cardiology for symptoms of chest pain and dyspnea.  She had cardiac testing done that was unrevealing for cause of her symptoms.  She lives in an older house, and there was concern for mold in her house.  She was worried that this exposure could have contributed to her symptoms.  She was seen by Dr. Neldon Mc  with allergy and asthma.  She was treated for allergic rhinoconjunctivitis.  She had CT sinus and lab work, and these were unrevealing.  More recently she was concerned about bugs in her home.  She set up a pesticide bomb and got exposed to the the fog from this.  She felt burning in her lungs and nose.  She tried using OTC nasal steroid but this made her feel worse.  She was seen by Dr. Neldon Mc  again, and by Dr. Lucia Gaskins with ENT.  She was also tried on PPI therapy for possible reflux, but this didn't help either.  She was tried on prednisone, and bactroban to her nares.  She denies history of smoking.  She denies history of asthma or exposure to TB.  She has not had skin rash, fever, gland swelling, or hemoptysis.   Physical exam  General - No distress Eyes - wears glasses ENT - No sinus tenderness, no oral exudate, no LAN, no thyromegaly, TM clear, pupils equal/reactive Cardiac - s1s2 regular, no murmur, pulses symmetric Chest - No wheeze/rales/dullness, good air entry, normal respiratory excursion Back - No focal tenderness Abd - Soft, non-tender, no organomegaly, + bowel sounds Ext - No edema Neuro - Normal strength, cranial nerves intact Skin - No rashes Psych - Normal mood, and behavior   CMP Latest Ref Rng & Units 03/29/2017 12/04/2015 10/17/2015  Glucose 65 - 99 mg/dL 100(H) 100(H) 91  BUN 6 - 20 mg/dL 12 19 18   Creatinine 0.44 - 1.00 mg/dL 0.76 0.89 0.91  Sodium 135 - 145 mmol/L 134(L) 140 140  Potassium 3.5 - 5.1 mmol/L 3.8 4.1 4.3  Chloride 101 - 111 mmol/L 99(L) 105 105  CO2 22 - 32 mmol/L 25 25 25   Calcium 8.9 - 10.3 mg/dL 9.4 9.5 9.1  Total Protein 6.5 - 8.1 g/dL 7.3 6.6 6.7  Total Bilirubin 0.3 - 1.2 mg/dL 0.9 0.7 0.5  Alkaline Phos 38 - 126 U/L 60 63 60  AST 15 - 41 U/L 21 16 18   ALT 14 - 54 U/L 18 14 19      CBC Latest Ref Rng & Units 03/29/2017  WBC 4.0 - 10.5 K/uL 9.7  Hemoglobin 12.0 - 15.0 g/dL 12.8  Hematocrit 36.0 - 46.0 % 36.4  Platelets 150 - 400 K/uL 227    CXR 03/30/17 >> normal   Discussion She has symptoms of dyspnea and sinus/chest burning.  She relates this to exposures from mold and pesticide.  She has been seen by allergist and ENT, and these evaluations were unrevealing.  She has been tried on therapy for her sinus and reflux, w/o improvement.  It is not clear to me what the cause of her symptoms  is.   Assessment/plan  Dyspnea. - will arrange for CT sinus and chest - will arrange for pulmonary function test - she might need bronchoscopy to further assess for inhalation lung injury after pesticide exposure   Patient Instructions  Will arrange for CT sinus and chest Will arrange for pulmonary function test  Follow up in 8 weeks     Chesley Mires, MD [facility] Pager:  [phone] 04/09/2017, 11:37 AM

## 2017-04-10 NOTE — Telephone Encounter (Signed)
Called patient and went over the information given by Dr.Kozlow. She understood but still thinks there is a test out there.

## 2017-04-10 NOTE — Telephone Encounter (Signed)
Please inform patient that I am not knowledgeable about any VALID tests for mold toxicity. None of the tests available today provide any certainty about anything.

## 2017-04-14 ENCOUNTER — Ambulatory Visit (INDEPENDENT_AMBULATORY_CARE_PROVIDER_SITE_OTHER)
Admission: RE | Admit: 2017-04-14 | Discharge: 2017-04-14 | Disposition: A | Discharge status: Home / Self Care | Payer: 59 | Source: Ambulatory Visit | Attending: Pulmonary Disease | Admitting: Pulmonary Disease

## 2017-04-14 DIAGNOSIS — J3489 Other specified disorders of nose and nasal sinuses: Secondary | ICD-10-CM | POA: Diagnosis not present

## 2017-04-14 DIAGNOSIS — R0602 Shortness of breath: Secondary | ICD-10-CM | POA: Diagnosis not present

## 2017-04-14 DIAGNOSIS — M950 Acquired deformity of nose: Secondary | ICD-10-CM | POA: Diagnosis not present

## 2017-04-14 DIAGNOSIS — R0600 Dyspnea, unspecified: Secondary | ICD-10-CM | POA: Diagnosis not present

## 2017-04-14 MED ORDER — IOPAMIDOL (ISOVUE-300) INJECTION 61%
75.0000 mL | Freq: Once | INTRAVENOUS | Status: AC | PRN
  Administered 2017-04-14: 75 mL via INTRAVENOUS

## 2017-04-16 NOTE — Telephone Encounter (Signed)
Pt calling about test results from this week.

## 2017-04-16 NOTE — Telephone Encounter (Signed)
Spoke with pt. She is aware of PFT results. Pt is having a hard time believing her test was normal. She wants to know if this is a "low normal or a high normal." While speaking to her, she would like her results from her CT chest and CT sinus.  Pt would like a referral to Dr. Benjamine Mola for an evaluation.  VS - please advise. Thanks.

## 2017-04-16 NOTE — Telephone Encounter (Signed)
CT chest 04/15/17 >> normal CT sinus 04/15/17 >> normal  Discussed results with pt.  Explained that her PFT, CT chest, CT sinus did not show anything to explain her symptoms.  She is still concerned about mold and asbestos exposure in her home.    She would like to have osteopontin level checked for possible asbestos exposure.  I explained that we would need to investigate where this could be done and then determine if this is covered by insurance.  She would also like to have hypersensitivity panel and RAST done to further assess other exposures that could be causing her symptoms.  Finally, she wants a referral to Dr. Benjamine Mola with ENT to further assess whether she has a film in the back of her throat and sinuses that is causing her symptoms.

## 2017-04-20 MED FILL — MONTELUKAST SOD 10 MG TAB: 10 | 30 days supply | Qty: 30 | Fill #2

## 2017-04-21 NOTE — Telephone Encounter (Signed)
Called patient. I informed patient that it was ok for her to take her Zyrtec in the morning and her Singulair in the evening.

## 2017-04-21 NOTE — Telephone Encounter (Signed)
Patient is calling about taking singulair and OTC antihistamine She was told this was ok, but wanted to clarify with the nurse Patient also wanted to infor the AAC staf taht her endoscopy/colooscopy, and her pulminologist appts had been scheduled and taken care of and already seen

## 2017-04-22 DIAGNOSIS — J322 Chronic ethmoidal sinusitis: Secondary | ICD-10-CM | POA: Diagnosis not present

## 2017-04-22 DIAGNOSIS — J040 Acute laryngitis: Secondary | ICD-10-CM | POA: Diagnosis not present

## 2017-04-22 DIAGNOSIS — J320 Chronic maxillary sinusitis: Secondary | ICD-10-CM | POA: Diagnosis not present

## 2017-04-22 MED FILL — FLUCONAZOLE 150 MG TABLET: 150 | 10 days supply | Qty: 3 | Fill #0

## 2017-04-22 MED FILL — PANTOPRAZOLE SOD DR 40 MG T: 40 | 30 days supply | Qty: 30 | Fill #0

## 2017-04-23 DIAGNOSIS — H04123 Dry eye syndrome of bilateral lacrimal glands: Secondary | ICD-10-CM | POA: Diagnosis not present

## 2017-04-28 DIAGNOSIS — K529 Noninfective gastroenteritis and colitis, unspecified: Secondary | ICD-10-CM | POA: Diagnosis not present

## 2017-04-28 DIAGNOSIS — R109 Unspecified abdominal pain: Secondary | ICD-10-CM | POA: Diagnosis not present

## 2017-05-06 DIAGNOSIS — K529 Noninfective gastroenteritis and colitis, unspecified: Secondary | ICD-10-CM | POA: Diagnosis not present

## 2017-05-18 DIAGNOSIS — R109 Unspecified abdominal pain: Secondary | ICD-10-CM | POA: Diagnosis not present

## 2017-05-27 DIAGNOSIS — R5382 Chronic fatigue, unspecified: Secondary | ICD-10-CM | POA: Diagnosis not present

## 2017-05-27 DIAGNOSIS — R894 Abnormal immunological findings in specimens from other organs, systems and tissues: Secondary | ICD-10-CM | POA: Diagnosis not present

## 2017-05-27 DIAGNOSIS — E538 Deficiency of other specified B group vitamins: Secondary | ICD-10-CM | POA: Diagnosis not present

## 2017-05-27 DIAGNOSIS — E559 Vitamin D deficiency, unspecified: Secondary | ICD-10-CM | POA: Diagnosis not present

## 2017-06-10 ENCOUNTER — Other Ambulatory Visit: Payer: Self-pay | Admitting: Allergy and Immunology

## 2017-06-10 MED FILL — PANTOPRAZOLE SOD DR 40 MG T: 40 | 30 days supply | Qty: 30 | Fill #1

## 2017-06-10 MED FILL — MONTELUKAST SOD 10 MG TAB: 10 | 30 days supply | Qty: 30 | Fill #0

## 2017-06-19 ENCOUNTER — Ambulatory Visit: Payer: 59 | Admitting: Pulmonary Disease

## 2017-06-25 MED FILL — BENZONATATE 100 MG CAPS: 100 | 5 days supply | Qty: 30 | Fill #0

## 2018-01-13 DIAGNOSIS — M25511 Pain in right shoulder: Secondary | ICD-10-CM | POA: Diagnosis not present

## 2018-01-13 DIAGNOSIS — M7502 Adhesive capsulitis of left shoulder: Secondary | ICD-10-CM | POA: Diagnosis not present

## 2018-01-13 DIAGNOSIS — M25512 Pain in left shoulder: Secondary | ICD-10-CM | POA: Diagnosis not present

## 2018-01-27 DIAGNOSIS — M7502 Adhesive capsulitis of left shoulder: Secondary | ICD-10-CM | POA: Diagnosis not present

## 2018-02-03 DIAGNOSIS — M7502 Adhesive capsulitis of left shoulder: Secondary | ICD-10-CM | POA: Diagnosis not present

## 2018-02-04 DIAGNOSIS — M7502 Adhesive capsulitis of left shoulder: Secondary | ICD-10-CM | POA: Diagnosis not present

## 2018-02-08 DIAGNOSIS — M7502 Adhesive capsulitis of left shoulder: Secondary | ICD-10-CM | POA: Diagnosis not present

## 2018-02-11 DIAGNOSIS — M7502 Adhesive capsulitis of left shoulder: Secondary | ICD-10-CM | POA: Diagnosis not present

## 2018-02-16 DIAGNOSIS — M7502 Adhesive capsulitis of left shoulder: Secondary | ICD-10-CM | POA: Diagnosis not present

## 2018-02-17 DIAGNOSIS — M7502 Adhesive capsulitis of left shoulder: Secondary | ICD-10-CM | POA: Diagnosis not present

## 2018-02-18 DIAGNOSIS — M7502 Adhesive capsulitis of left shoulder: Secondary | ICD-10-CM | POA: Diagnosis not present

## 2018-02-19 MED FILL — traMADol HCL 50 MG TABS: 50 | 15 days supply | Qty: 30 | Fill #0

## 2018-02-19 MED FILL — CYCLOBENZAPRINE 10 MG TAB: 10 | 10 days supply | Qty: 30 | Fill #0

## 2018-02-23 DIAGNOSIS — M7502 Adhesive capsulitis of left shoulder: Secondary | ICD-10-CM | POA: Diagnosis not present

## 2018-03-04 ENCOUNTER — Ambulatory Visit (INDEPENDENT_AMBULATORY_CARE_PROVIDER_SITE_OTHER): Payer: Self-pay | Admitting: Family Medicine

## 2018-03-04 VITALS — BP 110/75 | HR 74 | Temp 98.2°F | Resp 16 | Ht 64.0 in | Wt 150.8 lb

## 2018-03-04 DIAGNOSIS — M7502 Adhesive capsulitis of left shoulder: Secondary | ICD-10-CM | POA: Diagnosis not present

## 2018-03-04 DIAGNOSIS — Z0000 Encounter for general adult medical examination without abnormal findings: Secondary | ICD-10-CM

## 2018-03-04 NOTE — Patient Instructions (Signed)

## 2018-03-04 NOTE — Progress Notes (Signed)
Emily [name] is a 57 y.o. female who presents today with concerns of need for a annual physical exam. She has current health conditions of Frozen shoulder,  barretts espohogus and a history of Blurred vision under the care of an eye specialist. She does have PCP and care team of specialist providing her care.   Review of Systems  Constitutional: Negative for chills, fever and malaise/fatigue.  HENT: Negative for congestion, ear discharge, ear pain, sinus pain and sore throat.   Eyes: Negative.   Respiratory: Negative for cough, sputum production and shortness of breath.   Cardiovascular: Negative.  Negative for chest pain.  Gastrointestinal: Negative for abdominal pain, diarrhea, nausea and vomiting.  Genitourinary: Negative for dysuria, frequency, hematuria and urgency.  Musculoskeletal: Negative for myalgias.  Skin: Negative.   Neurological: Negative for headaches.  Endo/Heme/Allergies: Negative.   Psychiatric/Behavioral: Negative.     O: Vitals:   03/04/18 1859  BP: 110/75  Pulse: 74  Resp: 16  Temp: 98.2 F (36.8 C)  SpO2: 98%     Physical Exam  Constitutional: She is oriented to person, place, and time. Vital signs are normal. She appears well-developed and well-nourished. She is active.  Non-toxic appearance. She does not have a sickly appearance.  HENT:  Head: Normocephalic.  Right Ear: Hearing, tympanic membrane, external ear and ear canal normal.  Left Ear: Hearing, tympanic membrane, external ear and ear canal normal.  Nose: Nose normal.  Mouth/Throat: Uvula is midline and oropharynx is clear and moist.  Neck: Normal range of motion. Neck supple.  Cardiovascular: Normal rate, regular rhythm, normal heart sounds and normal pulses.  Pulmonary/Chest: Effort normal and breath sounds normal.  Abdominal: Soft. Bowel sounds are normal.  Musculoskeletal: Normal range of motion.  Lymphadenopathy:       Head (right side): No submental and no submandibular adenopathy  present.       Head (left side): No submental and no submandibular adenopathy present.    She has no cervical adenopathy.  Neurological: She is alert and oriented to person, place, and time.  Skin:     Chest puffiness- 6 cm x 4 cm ovoid superficial area- mildly firm but non fluctuant to touch- skin intact not erythemic  Psychiatric: She has a normal mood and affect.  Vitals reviewed.   A: 1. Physical exam      P: Exam findings, diagnosis etiology and medication use and indications reviewed with patient. Follow- Up and discharge instructions provided. No emergent/urgent issues found on exam.  Patient verbalized understanding of information provided and agrees with plan of care (POC), all questions answered.  1. Physical exam WNL- advised PCP eval of skin condition limited to chest- patient agrees with follow up recommendation. Other orders - cyclobenzaprine (FLEXERIL) 10 MG tablet; TAKE 1 TABLET BY MOUTH EVERY 8 HOURS AS NEEDED FOR SPASM

## 2018-03-09 DIAGNOSIS — M7502 Adhesive capsulitis of left shoulder: Secondary | ICD-10-CM | POA: Diagnosis not present

## 2018-03-10 DIAGNOSIS — M7502 Adhesive capsulitis of left shoulder: Secondary | ICD-10-CM | POA: Diagnosis not present

## 2018-03-13 IMAGING — CT CT CHEST W/ CM
2 of 3 series · 15 of 36 positions shown, 18 images · IV contrast (iopamidol)
Comparison: 03/30/2017 chest radiograph. 10/11/2015 coronary CT
angiogram.

CLINICAL DATA: Chronic dyspnea.

EXAM:
CT CHEST WITH CONTRAST
TECHNIQUE: Multidetector CT imaging of the chest was performed during
intravenous contrast administration.
CONTRAST:  75mL [id number] IOPAMIDOL ([id number]) INJECTION 61%

[Series 2: thorax · axial · 0.67mm/px · z∈[+1236,+1508]mm · 12 of 160 slices shown, 15 images]
[im 12/160  mediastinal]
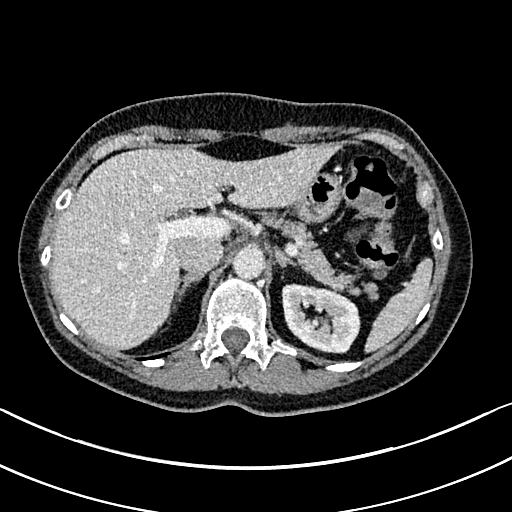
[im 12/160  lung]
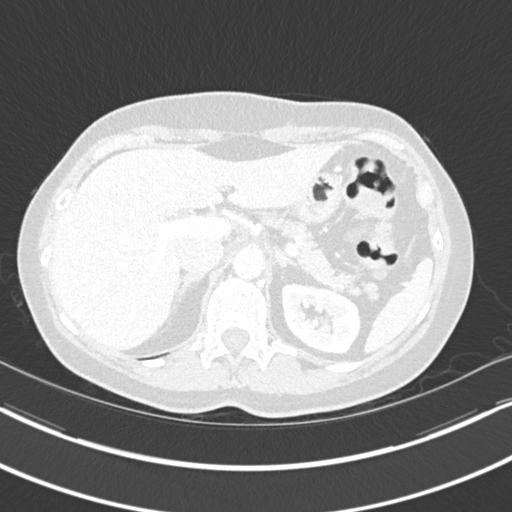
[im 24/160  lung]
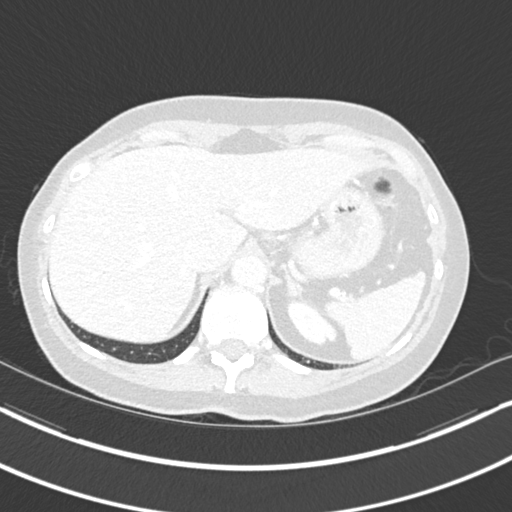
[im 36/160  lung]
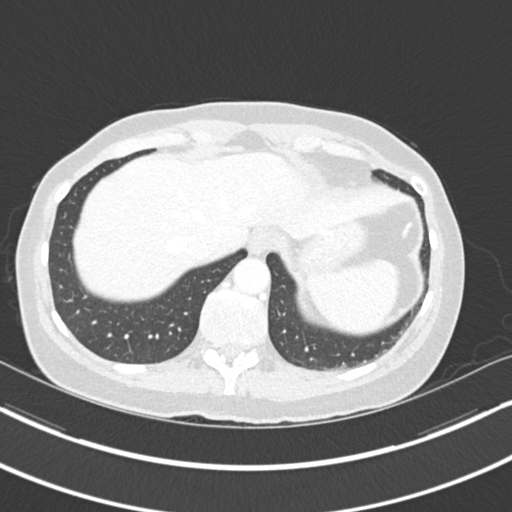
[im 48/160  lung]
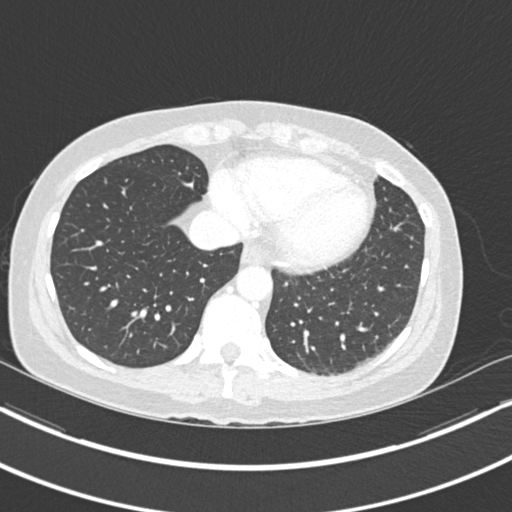
[im 59/160  mediastinal]
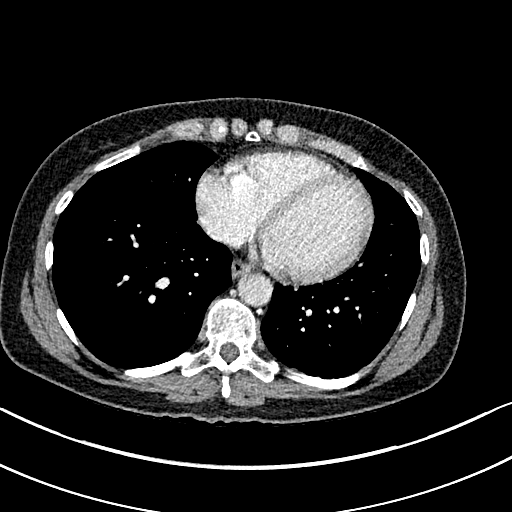
[im 59/160  lung]
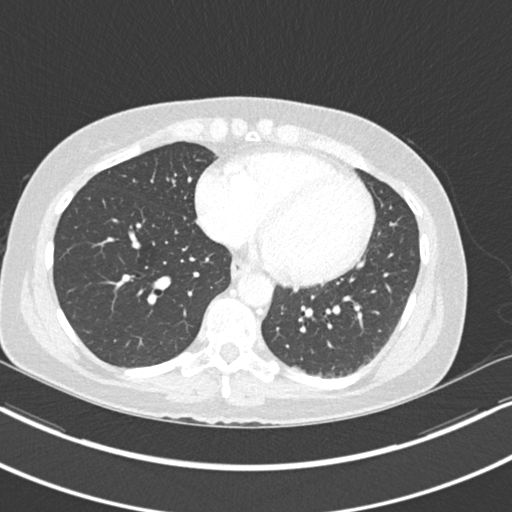
[im 71/160  lung]
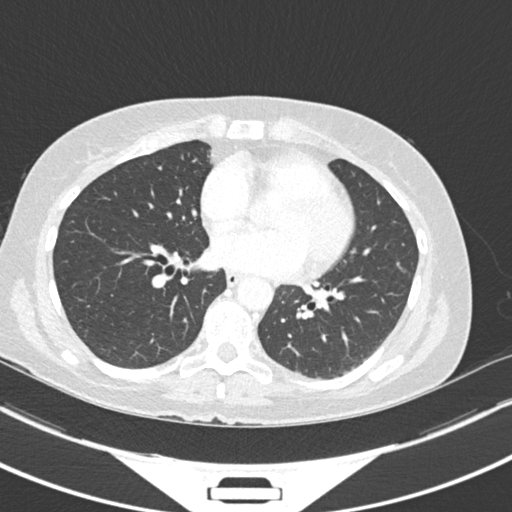
[im 89/160  lung]
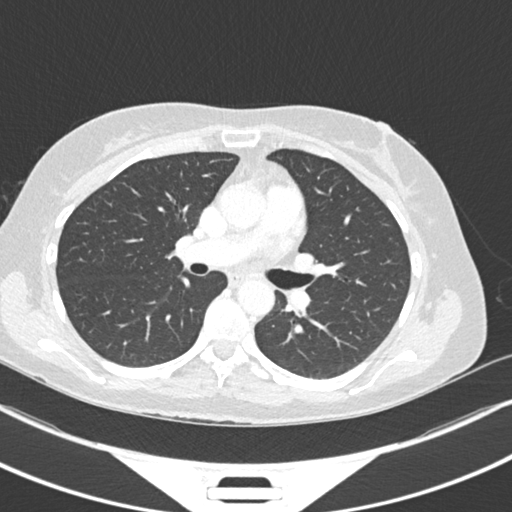
[im 101/160  lung]
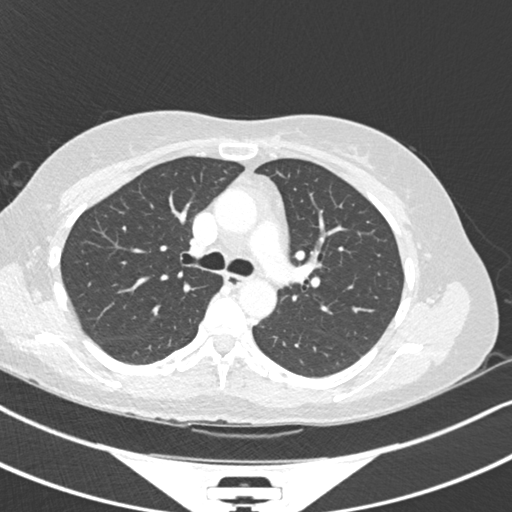
[im 112/160  mediastinal]
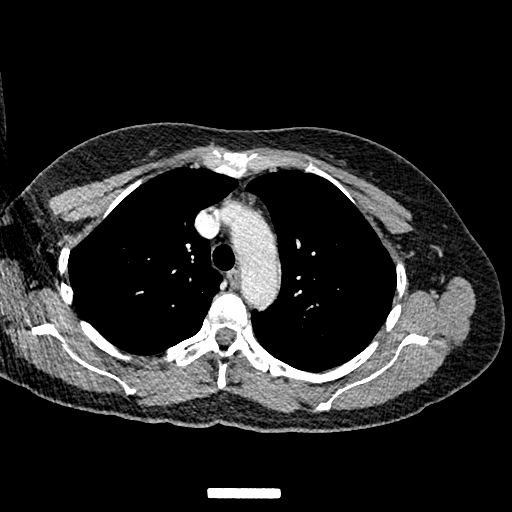
[im 112/160  lung]
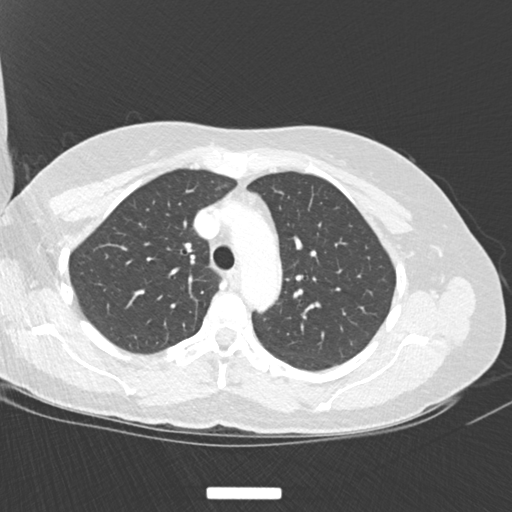
[im 124/160  lung]
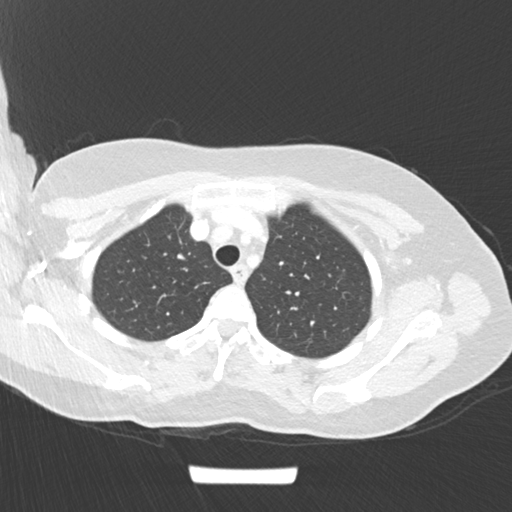
[im 136/160  lung]
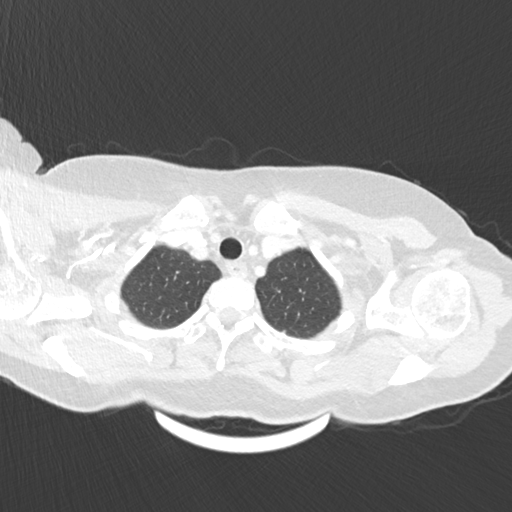
[im 148/160  lung]
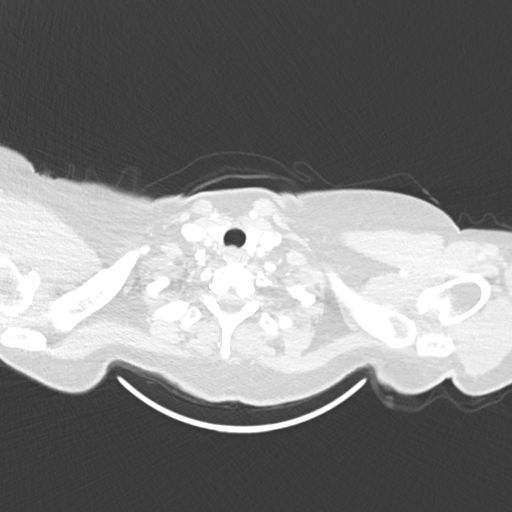

[Series 5: coronal · coronal · 0.62mm/px · 3 of 151 slices shown]
[im 31/151  lung]
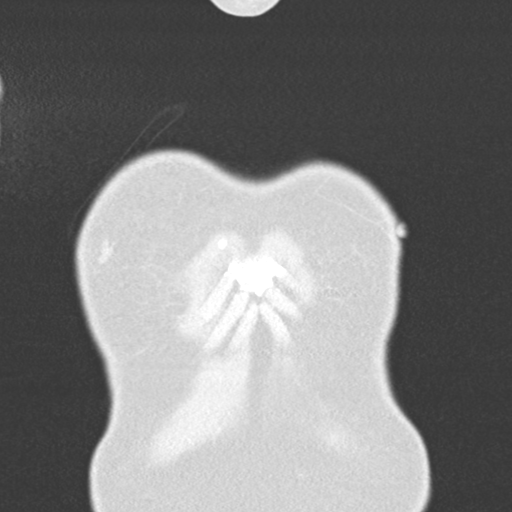
[im 61/151  lung]
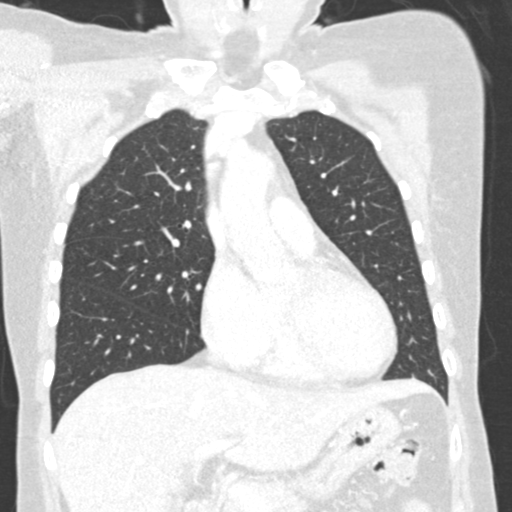
[im 91/151  lung]
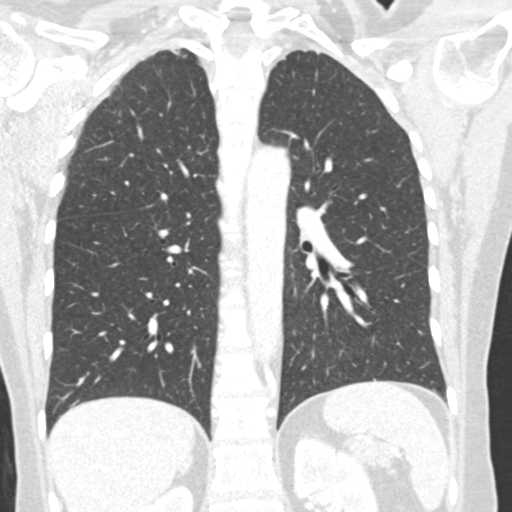

[15 of 36 positions shown; findings below may reference images not displayed]

FINDINGS: Cardiovascular: Normal heart size. No significant pericardial
fluid/thickening. Great vessels are normal in course and caliber. No
central pulmonary emboli.

Mediastinum/Nodes: No discrete thyroid nodules. Unremarkable
esophagus. No pathologically enlarged axillary, mediastinal or hilar
lymph nodes.

Lungs/Pleura: No pneumothorax. No pleural effusion. No acute
consolidative airspace disease, lung masses or significant pulmonary
nodules.

Upper abdomen: Unremarkable.

Musculoskeletal:  No aggressive appearing focal osseous lesions.
IMPRESSION: No active disease in the chest.

## 2018-03-16 DIAGNOSIS — M7502 Adhesive capsulitis of left shoulder: Secondary | ICD-10-CM | POA: Diagnosis not present

## 2018-03-17 DIAGNOSIS — N63 Unspecified lump in unspecified breast: Secondary | ICD-10-CM | POA: Diagnosis not present

## 2018-03-17 DIAGNOSIS — M25512 Pain in left shoulder: Secondary | ICD-10-CM | POA: Diagnosis not present

## 2018-03-17 DIAGNOSIS — M7502 Adhesive capsulitis of left shoulder: Secondary | ICD-10-CM | POA: Diagnosis not present

## 2018-03-18 DIAGNOSIS — M7502 Adhesive capsulitis of left shoulder: Secondary | ICD-10-CM | POA: Diagnosis not present

## 2018-03-19 ENCOUNTER — Other Ambulatory Visit: Payer: Self-pay | Admitting: Family Medicine

## 2018-03-19 DIAGNOSIS — N63 Unspecified lump in unspecified breast: Secondary | ICD-10-CM

## 2018-03-23 DIAGNOSIS — M7502 Adhesive capsulitis of left shoulder: Secondary | ICD-10-CM | POA: Diagnosis not present

## 2018-03-25 ENCOUNTER — Other Ambulatory Visit: Payer: 59

## 2018-03-25 DIAGNOSIS — M7502 Adhesive capsulitis of left shoulder: Secondary | ICD-10-CM | POA: Diagnosis not present

## 2018-03-30 DIAGNOSIS — M7502 Adhesive capsulitis of left shoulder: Secondary | ICD-10-CM | POA: Diagnosis not present

## 2018-04-01 DIAGNOSIS — M7502 Adhesive capsulitis of left shoulder: Secondary | ICD-10-CM | POA: Diagnosis not present

## 2018-04-07 ENCOUNTER — Ambulatory Visit
Admission: RE | Admit: 2018-04-07 | Discharge: 2018-04-07 | Disposition: A | Discharge status: Home / Self Care | Payer: 59 | Source: Ambulatory Visit | Attending: Family Medicine | Admitting: Family Medicine

## 2018-04-07 ENCOUNTER — Ambulatory Visit: Payer: 59

## 2018-04-07 DIAGNOSIS — R922 Inconclusive mammogram: Secondary | ICD-10-CM | POA: Diagnosis not present

## 2018-04-07 DIAGNOSIS — N6489 Other specified disorders of breast: Secondary | ICD-10-CM | POA: Diagnosis not present

## 2018-04-07 DIAGNOSIS — N63 Unspecified lump in unspecified breast: Secondary | ICD-10-CM

## 2018-04-08 DIAGNOSIS — M7502 Adhesive capsulitis of left shoulder: Secondary | ICD-10-CM | POA: Diagnosis not present

## 2018-04-13 DIAGNOSIS — M7502 Adhesive capsulitis of left shoulder: Secondary | ICD-10-CM | POA: Diagnosis not present

## 2018-04-16 MED FILL — PANTOPRAZOLE SOD DR 40 MG T: 40 | 30 days supply | Qty: 30 | Fill #0

## 2018-04-21 DIAGNOSIS — M7502 Adhesive capsulitis of left shoulder: Secondary | ICD-10-CM | POA: Diagnosis not present

## 2018-04-21 DIAGNOSIS — M25522 Pain in left elbow: Secondary | ICD-10-CM | POA: Diagnosis not present

## 2018-04-21 DIAGNOSIS — M25532 Pain in left wrist: Secondary | ICD-10-CM | POA: Diagnosis not present

## 2018-04-29 ENCOUNTER — Ambulatory Visit
Admission: RE | Admit: 2018-04-29 | Discharge: 2018-04-29 | Disposition: A | Discharge status: Home / Self Care | Payer: 59 | Source: Ambulatory Visit | Attending: Family Medicine | Admitting: Family Medicine

## 2018-04-29 ENCOUNTER — Other Ambulatory Visit: Payer: Self-pay | Admitting: Family Medicine

## 2018-04-29 DIAGNOSIS — S23420A Sprain of sternoclavicular (joint) (ligament), initial encounter: Secondary | ICD-10-CM | POA: Diagnosis not present

## 2018-04-29 DIAGNOSIS — R9389 Abnormal findings on diagnostic imaging of other specified body structures: Secondary | ICD-10-CM

## 2018-05-05 DIAGNOSIS — Z86010 Personal history of colonic polyps: Secondary | ICD-10-CM | POA: Diagnosis not present

## 2018-05-05 DIAGNOSIS — K2950 Unspecified chronic gastritis without bleeding: Secondary | ICD-10-CM | POA: Diagnosis not present

## 2018-05-05 DIAGNOSIS — K219 Gastro-esophageal reflux disease without esophagitis: Secondary | ICD-10-CM | POA: Diagnosis not present

## 2018-05-11 DIAGNOSIS — M7502 Adhesive capsulitis of left shoulder: Secondary | ICD-10-CM | POA: Diagnosis not present

## 2018-05-18 DIAGNOSIS — M7502 Adhesive capsulitis of left shoulder: Secondary | ICD-10-CM | POA: Diagnosis not present

## 2018-05-19 DIAGNOSIS — M7502 Adhesive capsulitis of left shoulder: Secondary | ICD-10-CM | POA: Diagnosis not present

## 2018-05-19 DIAGNOSIS — M7501 Adhesive capsulitis of right shoulder: Secondary | ICD-10-CM | POA: Diagnosis not present

## 2018-05-19 DIAGNOSIS — M25511 Pain in right shoulder: Secondary | ICD-10-CM | POA: Diagnosis not present

## 2018-05-19 MED FILL — predniSONE 5 MG TABS: 5 | 6 days supply | Qty: 21 | Fill #0

## 2018-05-25 DIAGNOSIS — M7502 Adhesive capsulitis of left shoulder: Secondary | ICD-10-CM | POA: Diagnosis not present

## 2018-06-01 DIAGNOSIS — M7502 Adhesive capsulitis of left shoulder: Secondary | ICD-10-CM | POA: Diagnosis not present

## 2018-06-16 DIAGNOSIS — Z86010 Personal history of colonic polyps: Secondary | ICD-10-CM | POA: Diagnosis not present

## 2018-06-16 DIAGNOSIS — M7502 Adhesive capsulitis of left shoulder: Secondary | ICD-10-CM | POA: Diagnosis not present

## 2018-06-17 DIAGNOSIS — M7502 Adhesive capsulitis of left shoulder: Secondary | ICD-10-CM | POA: Diagnosis not present

## 2018-06-24 DIAGNOSIS — M7502 Adhesive capsulitis of left shoulder: Secondary | ICD-10-CM | POA: Diagnosis not present

## 2018-06-30 DIAGNOSIS — M7502 Adhesive capsulitis of left shoulder: Secondary | ICD-10-CM | POA: Diagnosis not present

## 2018-07-01 DIAGNOSIS — M7502 Adhesive capsulitis of left shoulder: Secondary | ICD-10-CM | POA: Diagnosis not present

## 2018-07-08 DIAGNOSIS — M7502 Adhesive capsulitis of left shoulder: Secondary | ICD-10-CM | POA: Diagnosis not present

## 2018-07-14 DIAGNOSIS — M7502 Adhesive capsulitis of left shoulder: Secondary | ICD-10-CM | POA: Diagnosis not present

## 2018-07-15 DIAGNOSIS — M7502 Adhesive capsulitis of left shoulder: Secondary | ICD-10-CM | POA: Diagnosis not present

## 2018-07-15 MED FILL — CYCLOBENZAPRINE 10 MG TAB: 10 | 10 days supply | Qty: 30 | Fill #0

## 2018-07-21 DIAGNOSIS — M7502 Adhesive capsulitis of left shoulder: Secondary | ICD-10-CM | POA: Diagnosis not present

## 2018-07-22 DIAGNOSIS — M7502 Adhesive capsulitis of left shoulder: Secondary | ICD-10-CM | POA: Diagnosis not present

## 2018-07-28 DIAGNOSIS — M7502 Adhesive capsulitis of left shoulder: Secondary | ICD-10-CM | POA: Diagnosis not present

## 2018-07-29 DIAGNOSIS — M7502 Adhesive capsulitis of left shoulder: Secondary | ICD-10-CM | POA: Diagnosis not present

## 2018-08-02 DIAGNOSIS — M7502 Adhesive capsulitis of left shoulder: Secondary | ICD-10-CM | POA: Diagnosis not present

## 2018-08-02 DIAGNOSIS — M7501 Adhesive capsulitis of right shoulder: Secondary | ICD-10-CM | POA: Diagnosis not present

## 2018-08-11 DIAGNOSIS — R399 Unspecified symptoms and signs involving the genitourinary system: Secondary | ICD-10-CM | POA: Diagnosis not present

## 2018-08-11 DIAGNOSIS — N898 Other specified noninflammatory disorders of vagina: Secondary | ICD-10-CM | POA: Diagnosis not present

## 2018-08-12 MED FILL — NITROFURANTOIN MONO-MCR 100: 100 | 7 days supply | Qty: 14 | Fill #0

## 2018-08-30 MED FILL — predniSONE 5 MG TABS: 5 | 6 days supply | Qty: 21 | Fill #0

## 2018-09-21 DIAGNOSIS — N898 Other specified noninflammatory disorders of vagina: Secondary | ICD-10-CM | POA: Diagnosis not present

## 2018-09-21 MED FILL — FLUCONAZOLE 150 MG TABS: 150 | 28 days supply | Qty: 4 | Fill #0

## 2018-10-11 DIAGNOSIS — N952 Postmenopausal atrophic vaginitis: Secondary | ICD-10-CM | POA: Diagnosis not present

## 2018-10-11 DIAGNOSIS — R3989 Other symptoms and signs involving the genitourinary system: Secondary | ICD-10-CM | POA: Diagnosis not present

## 2018-10-11 DIAGNOSIS — N898 Other specified noninflammatory disorders of vagina: Secondary | ICD-10-CM | POA: Diagnosis not present

## 2018-10-11 MED FILL — PREMARIN VAGINAL CREAM-APPL: 0.625 | 30 days supply | Qty: 30 | Fill #0

## 2018-10-14 MED FILL — PREMARIN VAGINAL CREAM-APPL: 0.625 | 30 days supply | Qty: 30 | Fill #0

## 2018-10-19 ENCOUNTER — Other Ambulatory Visit: Payer: Self-pay | Admitting: Family Medicine

## 2018-10-19 DIAGNOSIS — E2839 Other primary ovarian failure: Secondary | ICD-10-CM

## 2018-11-18 DIAGNOSIS — M419 Scoliosis, unspecified: Secondary | ICD-10-CM | POA: Diagnosis not present

## 2018-11-24 DIAGNOSIS — E559 Vitamin D deficiency, unspecified: Secondary | ICD-10-CM | POA: Diagnosis not present

## 2018-11-24 DIAGNOSIS — Z5181 Encounter for therapeutic drug level monitoring: Secondary | ICD-10-CM | POA: Diagnosis not present

## 2018-11-24 DIAGNOSIS — K219 Gastro-esophageal reflux disease without esophagitis: Secondary | ICD-10-CM | POA: Diagnosis not present

## 2018-11-24 DIAGNOSIS — Z0000 Encounter for general adult medical examination without abnormal findings: Secondary | ICD-10-CM | POA: Diagnosis not present

## 2018-11-24 DIAGNOSIS — E785 Hyperlipidemia, unspecified: Secondary | ICD-10-CM | POA: Diagnosis not present

## 2018-11-24 MED FILL — PHENTERMINE 37.5 MG TABLET: 37.5 | 30 days supply | Qty: 30 | Fill #0

## 2018-11-24 MED FILL — PANTOPRAZOLE SOD DR 40 MG T: 40 | 90 days supply | Qty: 90 | Fill #0

## 2018-12-30 ENCOUNTER — Other Ambulatory Visit: Payer: 59

## 2019-01-19 ENCOUNTER — Other Ambulatory Visit: Payer: 59

## 2019-03-03 ENCOUNTER — Other Ambulatory Visit: Payer: 59

## 2019-03-30 DIAGNOSIS — Z01419 Encounter for gynecological examination (general) (routine) without abnormal findings: Secondary | ICD-10-CM | POA: Diagnosis not present

## 2019-03-30 DIAGNOSIS — Z6824 Body mass index (BMI) 24.0-24.9, adult: Secondary | ICD-10-CM | POA: Diagnosis not present

## 2019-04-01 DIAGNOSIS — R739 Hyperglycemia, unspecified: Secondary | ICD-10-CM | POA: Diagnosis not present

## 2019-04-06 DIAGNOSIS — Z13820 Encounter for screening for osteoporosis: Secondary | ICD-10-CM | POA: Diagnosis not present

## 2019-04-18 ENCOUNTER — Other Ambulatory Visit: Payer: 59

## 2019-04-26 ENCOUNTER — Telehealth: Payer: Self-pay | Admitting: Cardiology

## 2019-04-26 NOTE — Telephone Encounter (Signed)
New Message     Pt would like to re-establish her care with Dr Meda Coffee Dr Meda Coffee has no new Patient slots available but the pt would like to only see Dr Meda Coffee  She Will have her PC send a referral    Please call

## 2019-04-26 NOTE — Telephone Encounter (Signed)
Left a message for the pt to call back.  Held her a new pt slot on Dr. Francesca Oman schedule for next available on 08/10/19 at 1:40 pm.  Advised the pt to call back to confirm if this appt date and time works, or if she is having active complaints, then she will need to be scheduled at whichever Provider has the  next available new patient appointment slot.

## 2019-04-26 NOTE — Telephone Encounter (Signed)
Left the pt a message to call the office back. 

## 2019-04-26 NOTE — Telephone Encounter (Signed)
Pt called back and agreed to held appt, as a new patient to re-establish care with Dr. Meda Coffee on 12/2 at 1:40 pm.  Pt states she is having no active complaints at this time, she just needs to get re-established with Dr. Meda Coffee, given her age and her PCP advised her to.  Informed the pt that we will call her closer to that appt date to screen her and inform her of our process with seeing patients during [place].  Pt verbalized understanding and agrees with this plan. Pt was more than gracious for all the assistance provided.

## 2019-04-26 NOTE — Telephone Encounter (Signed)
New Message ° ° °Patient returning your call. °

## 2019-05-11 DIAGNOSIS — H5203 Hypermetropia, bilateral: Secondary | ICD-10-CM | POA: Diagnosis not present

## 2019-06-08 DIAGNOSIS — R7303 Prediabetes: Secondary | ICD-10-CM | POA: Diagnosis not present

## 2019-06-08 DIAGNOSIS — E785 Hyperlipidemia, unspecified: Secondary | ICD-10-CM | POA: Diagnosis not present

## 2019-06-08 MED FILL — PRAVASTATIN NA 10 MG TAB: 10 | 90 days supply | Qty: 90 | Fill #0

## 2019-07-14 ENCOUNTER — Ambulatory Visit: Payer: 59 | Admitting: Sports Medicine

## 2019-07-14 ENCOUNTER — Encounter: Payer: Self-pay | Admitting: Sports Medicine

## 2019-07-14 ENCOUNTER — Other Ambulatory Visit: Payer: Self-pay

## 2019-07-14 VITALS — BP 90/62 | Ht 64.0 in | Wt 140.0 lb

## 2019-07-14 DIAGNOSIS — M25579 Pain in unspecified ankle and joints of unspecified foot: Secondary | ICD-10-CM

## 2019-07-14 NOTE — Progress Notes (Signed)
  Maysa Eskildsen - 58 y.o. female MRN [id number]  Date of birth: [birth year]  SUBJECTIVE:   CC: Patient is 58 year old female who presents for bilateral foot pain for the past 6 to 7 months.  Patient states that pain began when she changed working locations for [facility] which she states have harder floors and require more walking.  She describes pain as an aching and sharp pain located in the front of her heel and bottom of her foot, worse in the morning, first few steps after activity, and at the end of an active day.  States she does not have pain during moderate activity.  Denies similar pain in the past.  Denies weakness/numbness/tingling in extremities.  She has had the same tennis shoes and work shoes the past 5 years and believes that they are starting to wear out.  She recently bought a new pair of tennis shoes, but was unable to wear them longer than 3 to 4 days due to discomfort that they created along the top of her foot.    ROS: No unexpected weight loss, fever, chills, swelling, instability, muscle pain, numbness/tingling, redness, otherwise see HPI   PMHx - Updated and reviewed.  Contributory factors include: Negative PSHx - Updated and reviewed.  Contributory factors include:  Negative FHx - Updated and reviewed.  Contributory factors include:  Negative Social Hx - Updated and reviewed. Contributory factors include: Negative Medications - reviewed   DATA REVIEWED: N/A  PHYSICAL EXAM:  VS: BP:90/62  HR: bpm  TEMP: ( )  RESP:   HT:5\' 4"  (162.6 cm)   WT:140 lb (63.5 kg)  BMI:24.02 PHYSICAL EXAM: Gen: NAD, alert, cooperative with exam, well-appearing  Musculoskeletal: Ankles: Full range of motion, muscle strength 5/5, 2+ Achilles reflex.  Bilaterally equal stability.  2+ peripheral pulses. Feet: Mild/moderate pain with palpation at anterior portion of calcaneus and along plantar fascia.  Pes cavus bilaterally.  Decreased transverse arch bilaterally. Skin: Dry  without lesion, erythema, edema   ASSESSMENT & PLAN:   #Bilateral foot pain -Likely resulting from pes cavus and bilateral plantar fasciitis.  Plantar fasciitis probable based on history, but not absolute on physical exam. -Patient to buy comfortable pair of athletic shoes and return to clinic for fitted orthotics -Start NSAIDs as needed for pain -Given information on Achilles tendinitis night support sock and recommended to use nightly -Start Achilles tendinitis exercises  Portions of this note were written by medical resident, Glennon Mac, PGY 3.  Patient seen and evaluated with the resident.  I agree with the above plan of care.  Patient will return to the office in the future with new tennis shoes to be fit with custom orthotics.  In the meantime, she will start plantar fascial stretching and I have given her information on a [place] sock. ]

## 2019-07-15 ENCOUNTER — Encounter: Payer: Self-pay | Admitting: Sports Medicine

## 2019-08-10 ENCOUNTER — Other Ambulatory Visit: Payer: Self-pay

## 2019-08-10 ENCOUNTER — Ambulatory Visit: Payer: 59 | Admitting: Cardiology

## 2019-08-10 VITALS — BP 116/70 | HR 89 | Ht 64.0 in | Wt 139.0 lb

## 2019-08-10 DIAGNOSIS — R9431 Abnormal electrocardiogram [ECG] [EKG]: Secondary | ICD-10-CM | POA: Diagnosis not present

## 2019-08-10 DIAGNOSIS — I739 Peripheral vascular disease, unspecified: Secondary | ICD-10-CM | POA: Diagnosis not present

## 2019-08-10 DIAGNOSIS — E782 Mixed hyperlipidemia: Secondary | ICD-10-CM | POA: Diagnosis not present

## 2019-08-10 DIAGNOSIS — R7303 Prediabetes: Secondary | ICD-10-CM | POA: Insufficient documentation

## 2019-08-10 DIAGNOSIS — Z8249 Family history of ischemic heart disease and other diseases of the circulatory system: Secondary | ICD-10-CM

## 2019-08-10 NOTE — Progress Notes (Signed)
Cardiology Office Note:    Date:  08/10/2019   ID:  Emily [name], DOB [birth year], MRN [id number]  PCP:  [name], MD  Cardiologist:  No primary care provider on file.  Electrophysiologist:  None   Referring MD: [name], MD   Chief complaint: Claudication  History of Present Illness:    Emily [name] is a 58 y.o. female with a hx of hyperlipidemia, who was previously seen by me on 2015-07-31  for evaluation of chest pain and dyspnea on exertion. The patient states that she used to be very active and run, currently minimally active. 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 She started to experience left sided chest pains and left arm pain in Nov that is hard for her to describe. Her father had MI at age 20, and died of MI in his 31'. Her mother was diagnosed with atrial fibrillation with bradycardia requiring PM placement and had a complication - RV perforation. She underwent a coronary CTA in 2017 that showed Coronary calcium score of 0 this was 0percentile for age and sex matched control. 2. Normal coronary Right dominance. 3.  No evidence of CAD.  08/10/2019 - the patient is coming after 3 years, she has been doing okay, however has been experiencing different symptoms of arthritis, she has been diagnosed with elevated hemoglobin A1c.  She has been going through a lot of stress, she lost her mother a week ago secondary to complications of peripheral arterial disease.  The patient feels that her legs hurt while she is walking, she has been also experiencing other symptoms such as frozen shoulder.   Past Medical History:  Diagnosis Date  . Abnormal EKG 2015/07/31  . Anxiety and depression   . Bruises easily   . Chest pain   . Chest pain with moderate risk of acute coronary syndrome   . Chest tightness   . Elevated cholesterol   . Family history of premature CAD July 31, 2015   Father had MI 61's, died at 49 of an MI   . Fatigue   . Fibroid   . Fx sacrum/coccyx-closed ([place])    . Gastritis   . GERD (gastroesophageal reflux disease)   . Hyperlipidemia 31-Jul-2015  . Hyperlipidemia   . Osteopenia   . Pigmented skin lesions   . Sinus problem   . SOB (shortness of breath) on exertion   . Vitamin D deficiency   . Weight gain     Past Surgical History:  Procedure Laterality Date  . ADENOIDECTOMY    . TONSILLECTOMY      Current Medications: Current Meds  Medication Sig  . Cholecalciferol (VITAMIN D3) LIQD Take by mouth daily.   . pantoprazole (PROTONIX) 40 MG tablet Take 40 mg by mouth as needed.      Allergies:   Crestor [rosuvastatin calcium] and Latex   Social History   Socioeconomic History  . Marital status: Single    Spouse name: Not on file  . Number of children: Not on file  . Years of education: Not on file  . Highest education level: Not on file  Occupational History  . Occupation: Therapist, sports  Social Needs  . Financial resource strain: Not on file  . Food insecurity    Worry: Not on file    Inability: Not on file  . Transportation needs    Medical: Not on file    Non-medical: Not on file  Tobacco Use  . Smoking status: Never Smoker  . Smokeless tobacco:  Never Used  Substance and Sexual Activity  . Alcohol use: Yes  . Drug use: No  . Sexual activity: Not on file  Lifestyle  . Physical activity    Days per week: Not on file    Minutes per session: Not on file  . Stress: Not on file  Relationships  . Social Herbalist on phone: Not on file    Gets together: Not on file    Attends religious service: Not on file    Active member of club or organization: Not on file    Attends meetings of clubs or organizations: Not on file    Relationship status: Not on file  Other Topics Concern  . Not on file  Social History Narrative  . Not on file     Family History: The patient's family history includes Arthritis in her mother; Atrial fibrillation in her father and mother; COPD in her father; Diabetes in her father; Heart attack  (age of onset: 58) in her father; Hypertension in her father and mother; Stroke in her mother. There is no history of Breast cancer.  ROS:   Please see the history of present illness.     All other systems reviewed and are negative.  EKGs/Labs/Other Studies Reviewed:    The following studies were reviewed today:  EKG:  EKG is ordered today.  The ekg shows sinus rhythm with negative T waves in the inferior and anterolateral leads unchanged from prior.  Recent Labs: No results found for requested labs within last 8760 hours.  Recent Lipid Panel    Component Value Date/Time   CHOL 205 (H) 10/17/2015 1253   TRIG 163 (H) 10/17/2015 1253   HDL 61 10/17/2015 1253   CHOLHDL 3.4 10/17/2015 1253   LDLCALC 111 10/17/2015 1253    Physical Exam:    VS:  BP 116/70   Pulse 89   Ht 5\' 4"  (1.626 m)   Wt 139 lb (63 kg)   SpO2 98%   BMI 23.86 kg/m     Wt Readings from Last 3 Encounters:  08/10/19 139 lb (63 kg)  07/14/19 140 lb (63.5 kg)  03/04/18 150 lb 12.8 oz (68.4 kg)     GEN: Well nourished, well developed in no acute distress HEENT: Normal NECK: No JVD; No carotid bruits LYMPHATICS: No lymphadenopathy CARDIAC: RRR, no murmurs, rubs, gallops RESPIRATORY:  Clear to auscultation without rales, wheezing or rhonchi  ABDOMEN: Soft, non-tender, non-distended MUSCULOSKELETAL:  No edema; No deformity  SKIN: Warm and dry NEUROLOGIC:  Alert and oriented x 3 PSYCHIATRIC:  Normal affect   Echo 09/19/15 Study Conclusions  - Left ventricle: The cavity size was normal. Wall thickness was normal. Systolic function was normal. The estimated ejection fraction was in the range of 55% to 60%. Wall motion was normal; there were no regional wall motion abnormalities.  Coronary CT 10/11/2015 IMPRESSION: 1. Coronary calcium score of 0 this was 0percentile for age and sex matched control. 2. Normal coronary Right dominance. 3. No evidence of CAD.   ASSESSMENT:    1. Claudication  ([place])   2. Family history of premature coronary artery disease   3. Mixed hyperlipidemia   4. EKG, abnormal    PLAN:    In order of problems listed above:  1.  Claudications, patient has normal peripheral pulses, given her family history we will obtain bilateral peripheral arterial duplex ultrasound   2.  Abnormal EKG with negative T waves in the inferior and anterolateral  leads, this is unchanged from prior, 3 years ago she underwent coronary CTA that was normal.    3.  Hyperlipidemia, we will obtain lipids from her primary care physician.  She was unable to tolerate Crestor in the past.  Given her possible increased inflammatory state, will try to find a statin that she can tolerate.  4.  Polyarthritis, the patient is advised to search for rheumatologist to evaluate for any possible autoimmune disease.    Medication Adjustments/Labs and Tests Ordered: Current medicines are reviewed at length with the patient today.  Concerns regarding medicines are outlined above.  Orders Placed This Encounter  Procedures  . EKG 12-Lead  . VAS [place] LOWER EXTREMITY ARTERIAL DUPLEX   No orders of the defined types were placed in this encounter.   Patient Instructions  Medication Instructions:   Your physician recommends that you continue on your current medications as directed. Please refer to the Current Medication list given to you today.  *If you need a refill on your cardiac medications before your next appointment, please call your pharmacy*    Testing/Procedures:  Your physician has requested that you have a lower  extremity arterial duplex. This test is an ultrasound of the arteries in the legs or arms. It looks at arterial blood flow in the legs and arms. Allow one hour for Lower and Upper Arterial scans. There are no restrictions or special instructions   Follow-Up: At [facility], you and your health needs are our priority.  As part of our continuing mission to provide you with  exceptional heart care, we have created designated Provider Care Teams.  These Care Teams include your primary Cardiologist (physician) and Advanced Practice Providers (APPs -  Physician Assistants and Nurse Practitioners) who all work together to provide you with the care you need, when you need it.  Your next appointment:   12 month(s)  The format for your next appointment:   In Person  Provider:   Ena Dawley, MD       Signed, Ena Dawley, MD  08/10/2019 9:21 PM    [place]

## 2019-08-10 NOTE — Patient Instructions (Signed)
Medication Instructions:   Your physician recommends that you continue on your current medications as directed. Please refer to the Current Medication list given to you today.  *If you need a refill on your cardiac medications before your next appointment, please call your pharmacy*    Testing/Procedures:  Your physician has requested that you have a lower  extremity arterial duplex. This test is an ultrasound of the arteries in the legs or arms. It looks at arterial blood flow in the legs and arms. Allow one hour for Lower and Upper Arterial scans. There are no restrictions or special instructions   Follow-Up: At [facility], you and your health needs are our priority.  As part of our continuing mission to provide you with exceptional heart care, we have created designated Provider Care Teams.  These Care Teams include your primary Cardiologist (physician) and Advanced Practice Providers (APPs -  Physician Assistants and Nurse Practitioners) who all work together to provide you with the care you need, when you need it.  Your next appointment:   12 month(s)  The format for your next appointment:   In Person  Provider:   Ena Dawley, MD

## 2019-08-11 ENCOUNTER — Ambulatory Visit (INDEPENDENT_AMBULATORY_CARE_PROVIDER_SITE_OTHER): Payer: 59 | Admitting: Pediatrics

## 2019-08-11 VITALS — BP 102/66 | Ht 64.0 in | Wt 139.0 lb

## 2019-08-11 DIAGNOSIS — M25579 Pain in unspecified ankle and joints of unspecified foot: Secondary | ICD-10-CM | POA: Diagnosis not present

## 2019-08-11 NOTE — Progress Notes (Signed)
  Emily [name] - 58 y.o. female MRN [id number]  Date of birth: [birth year]  SUBJECTIVE:   CC: orthotics   58 yo female presenting for orthotics. She was seen on 07/14/2019, diagnosed with bilateral plantar fascitis. She is requesting an additional prescription for comfortable athletic shoes (purchased [place] which have helped with pain) as well as a night splint for plantar fascitis as [place] sock is too tight on her leg.  For full exam, assessment, and plan, please see note from 07/14/2019  ASSESSMENT & PLAN:   Patient was fitted for a : standard, cushioned, semi-rigid orthotic. The orthotic was heated and afterward the patient stood on the orthotic blank positioned on the orthotic stand. The patient was positioned in subtalar neutral position and 10 degrees of ankle dorsiflexion in a weight bearing stance. After completion of molding, a stable base was applied to the orthotic blank. The blank was ground to a stable position for weight bearing. Size: 6 Base: blue EVA Posting: none Additional orthotic padding: none  Gait was neutral with orthotics in place. Patient found them to be comfortable. Follow-up as needed.  Also provided written prescription for comfortable shoes x 2, night splint for plantar fascitis, and orthotics.   I was the preceptor for this visit and available for immediate consultation.  Patient's orthotics needed several adjustments before she found them to be comfortable.  We have reassured her that she may return to the office at a later date for further adjustments if needed. Shellia Cleverly, DO

## 2019-08-15 ENCOUNTER — Other Ambulatory Visit (HOSPITAL_COMMUNITY): Payer: Self-pay | Admitting: Cardiology

## 2019-08-15 DIAGNOSIS — M5410 Radiculopathy, site unspecified: Secondary | ICD-10-CM

## 2019-08-31 ENCOUNTER — Ambulatory Visit (HOSPITAL_COMMUNITY)
Admission: RE | Admit: 2019-08-31 | Discharge: 2019-08-31 | Disposition: A | Discharge status: Home / Self Care | Payer: 59 | Source: Ambulatory Visit | Attending: Cardiovascular Disease | Admitting: Cardiovascular Disease

## 2019-08-31 ENCOUNTER — Other Ambulatory Visit: Payer: Self-pay

## 2019-08-31 DIAGNOSIS — M5410 Radiculopathy, site unspecified: Secondary | ICD-10-CM | POA: Insufficient documentation

## 2019-08-31 DIAGNOSIS — I739 Peripheral vascular disease, unspecified: Secondary | ICD-10-CM | POA: Insufficient documentation

## 2019-09-21 ENCOUNTER — Encounter (HOSPITAL_COMMUNITY): Payer: 59

## 2020-02-28 DIAGNOSIS — Z6825 Body mass index (BMI) 25.0-25.9, adult: Secondary | ICD-10-CM | POA: Diagnosis not present

## 2020-02-28 DIAGNOSIS — N950 Postmenopausal bleeding: Secondary | ICD-10-CM | POA: Diagnosis not present

## 2020-02-28 DIAGNOSIS — Z01411 Encounter for gynecological examination (general) (routine) with abnormal findings: Secondary | ICD-10-CM | POA: Diagnosis not present

## 2020-02-28 DIAGNOSIS — Z130 Encounter for screening for diseases of the blood and blood-forming organs and certain disorders involving the immune mechanism: Secondary | ICD-10-CM | POA: Diagnosis not present

## 2020-02-28 DIAGNOSIS — Z1389 Encounter for screening for other disorder: Secondary | ICD-10-CM | POA: Diagnosis not present

## 2020-02-29 ENCOUNTER — Ambulatory Visit: Payer: 59 | Attending: Internal Medicine

## 2020-02-29 DIAGNOSIS — E785 Hyperlipidemia, unspecified: Secondary | ICD-10-CM | POA: Diagnosis not present

## 2020-02-29 DIAGNOSIS — N921 Excessive and frequent menstruation with irregular cycle: Secondary | ICD-10-CM | POA: Diagnosis not present

## 2020-02-29 DIAGNOSIS — Z5181 Encounter for therapeutic drug level monitoring: Secondary | ICD-10-CM | POA: Diagnosis not present

## 2020-02-29 DIAGNOSIS — Z0000 Encounter for general adult medical examination without abnormal findings: Secondary | ICD-10-CM | POA: Diagnosis not present

## 2020-02-29 DIAGNOSIS — E559 Vitamin D deficiency, unspecified: Secondary | ICD-10-CM | POA: Diagnosis not present

## 2020-02-29 DIAGNOSIS — Z20822 Contact with and (suspected) exposure to covid-19: Secondary | ICD-10-CM | POA: Diagnosis not present

## 2020-02-29 DIAGNOSIS — E611 Iron deficiency: Secondary | ICD-10-CM | POA: Diagnosis not present

## 2020-02-29 DIAGNOSIS — R7303 Prediabetes: Secondary | ICD-10-CM | POA: Diagnosis not present

## 2020-03-01 LAB — NOVEL CORONAVIRUS, NAA: SARS-CoV-2, NAA: NOT DETECTED

## 2020-03-01 LAB — SARS-COV-2, NAA 2 DAY TAT

## 2020-03-05 MED FILL — miSOPROStol 200 MCG TABS: 200 | 1 days supply | Qty: 1 | Fill #0

## 2020-03-06 DIAGNOSIS — C541 Malignant neoplasm of endometrium: Secondary | ICD-10-CM | POA: Diagnosis not present

## 2020-03-06 DIAGNOSIS — N950 Postmenopausal bleeding: Secondary | ICD-10-CM | POA: Diagnosis not present

## 2020-03-07 DIAGNOSIS — Z1151 Encounter for screening for human papillomavirus (HPV): Secondary | ICD-10-CM | POA: Diagnosis not present

## 2020-03-07 DIAGNOSIS — Z1272 Encounter for screening for malignant neoplasm of vagina: Secondary | ICD-10-CM | POA: Diagnosis not present

## 2020-03-07 DIAGNOSIS — R87619 Unspecified abnormal cytological findings in specimens from cervix uteri: Secondary | ICD-10-CM | POA: Diagnosis not present

## 2020-03-13 ENCOUNTER — Telehealth: Payer: Self-pay | Admitting: *Deleted

## 2020-03-13 NOTE — Telephone Encounter (Signed)
Called and scheduled the patient for a new patient appt on Thursday

## 2020-03-15 ENCOUNTER — Encounter: Payer: Self-pay | Admitting: Gynecologic Oncology

## 2020-03-15 ENCOUNTER — Other Ambulatory Visit: Payer: Self-pay

## 2020-03-15 ENCOUNTER — Inpatient Hospital Stay: Payer: 59 | Attending: Gynecologic Oncology | Admitting: Gynecologic Oncology

## 2020-03-15 ENCOUNTER — Other Ambulatory Visit: Payer: Self-pay | Admitting: Gynecologic Oncology

## 2020-03-15 ENCOUNTER — Telehealth: Payer: Self-pay | Admitting: Gynecologic Oncology

## 2020-03-15 VITALS — BP 119/75 | HR 90 | Temp 98.0°F | Resp 16 | Ht 64.0 in | Wt 144.0 lb

## 2020-03-15 DIAGNOSIS — N950 Postmenopausal bleeding: Secondary | ICD-10-CM | POA: Diagnosis not present

## 2020-03-15 DIAGNOSIS — C541 Malignant neoplasm of endometrium: Secondary | ICD-10-CM

## 2020-03-15 DIAGNOSIS — E7800 Pure hypercholesterolemia, unspecified: Secondary | ICD-10-CM | POA: Insufficient documentation

## 2020-03-15 DIAGNOSIS — Z791 Long term (current) use of non-steroidal anti-inflammatories (NSAID): Secondary | ICD-10-CM | POA: Insufficient documentation

## 2020-03-15 DIAGNOSIS — M8580 Other specified disorders of bone density and structure, unspecified site: Secondary | ICD-10-CM | POA: Diagnosis not present

## 2020-03-15 DIAGNOSIS — E785 Hyperlipidemia, unspecified: Secondary | ICD-10-CM | POA: Diagnosis not present

## 2020-03-15 DIAGNOSIS — I890 Lymphedema, not elsewhere classified: Secondary | ICD-10-CM | POA: Insufficient documentation

## 2020-03-15 DIAGNOSIS — Z79899 Other long term (current) drug therapy: Secondary | ICD-10-CM | POA: Diagnosis not present

## 2020-03-15 DIAGNOSIS — K219 Gastro-esophageal reflux disease without esophagitis: Secondary | ICD-10-CM | POA: Insufficient documentation

## 2020-03-15 DIAGNOSIS — N898 Other specified noninflammatory disorders of vagina: Secondary | ICD-10-CM | POA: Insufficient documentation

## 2020-03-15 MED ORDER — OXYCODONE HCL 5 MG PO TABS: 5.0000 mg | ORAL_TABLET | ORAL | 0 refills | Status: DC | PRN

## 2020-03-15 MED ORDER — IBUPROFEN 800 MG PO TABS: 800.0000 mg | ORAL_TABLET | Freq: Three times a day (TID) | ORAL | 0 refills | Status: DC | PRN

## 2020-03-15 MED ORDER — SENNOSIDES-DOCUSATE SODIUM 8.6-50 MG PO TABS: 2.0000 | ORAL_TABLET | Freq: Every day | ORAL | 0 refills | Status: DC

## 2020-03-15 NOTE — H&P (View-Only) (Signed)
GYNECOLOGIC ONCOLOGY NEW PATIENT CONSULTATION   Patient Name: Emily [name]  Patient Age: 59 y.o. Date of Service: 03/15/20 Referring Provider: Dr. Marvel Plan  Primary Care Provider: [name], MD Consulting Provider: Jeral Pinch, MD   Assessment/Plan:  Postmenopausal patient with newly diagnosed uterine cancer.   We reviewed the nature of endometrial cancer and its recommended surgical staging, including total hysterectomy, bilateral salpingo-oophorectomy, and lymph node assessment. The patient is a suitable candidate for staging via a minimally invasive approach to surgery.  We reviewed that robotic assistance would be used to complete the surgery.   We discussed that most endometrial cancer is detected early and that decisions regarding adjuvant therapy will be made based on her final pathology.  Given my limited exam, will plan to get a transabdominal ultrasound prior to surgery.  We reviewed the sentinel lymph node technique. Risks and benefits of sentinel lymph node biopsy was reviewed. We reviewed the technique and ICG dye. The patient DOES NOT have an iodine allergy or known liver dysfunction. We reviewed the false negative rate (0.4%), and that 3% of patients with metastatic disease will not have it detected by SLN biopsy in endometrial cancer. A low risk of allergic reaction to the dye, <0.2% for ICG, has been reported. We also discussed that in the case of failed mapping, which occurs 40% of the time, a bilateral or unilateral lymphadenectomy will be performed at the surgeon's discretion.   Potential benefits of sentinel nodes including a higher detection rate for metastasis due to ultrastaging and potential reduction in operative morbidity. However, there remains uncertainty as to the role for treatment of micrometastatic disease. Further, the benefit of operative morbidity associated with the SLN technique in endometrial cancer is not yet completely known. In other patient  populations (e.g. the cervical cancer population) there has been observed reductions in morbidity with SLN biopsy compared to pelvic lymphadenectomy. Lymphedema, nerve dysfunction and lymphocysts are all potential risks with the SLN technique as with complete lymphadenectomy. Additional risks to the patient include the risk of damage to an internal organ while operating in an altered view (e.g. the black and white image of the robotic fluorescence imaging mode).   We discussed the plan for a robotic assisted hysterectomy, bilateral salpingo-oophorectomy, sentinel lymph node evaluation, possible lymph node dissection, possible laparotomy, likely episiotomy, possible urethral biopsy, possible vaginal biopsy. The risks of surgery were discussed in detail and she understands these to include infection; wound separation; hernia; vaginal cuff separation, injury to adjacent organs such as bowel, bladder, blood vessels, ureters and nerves; bleeding which may require blood transfusion; anesthesia risk; thromboembolic events; possible death; unforeseen complications; possible need for re-exploration; medical complications such as heart attack, stroke, pleural effusion and pneumonia; and, if full lymphadenectomy is performed the risk of lymphedema and lymphocyst. The patient will receive DVT and antibiotic prophylaxis as indicated. She voiced a clear understanding. She had the opportunity to ask questions. Perioperative instructions were reviewed with her. Prescriptions for post-op medications were sent to her pharmacy of choice.  Given the appearance of her urethra on exam today (I suspect related to atrophy), I reviewed the possibility of biopsy at the time of surgery. We also reviewed her pain with vaginal insertion (suspect that this is related to her narrow introitus). I recommended an episiotomy, both for placement of a uterine manipulator but also as an attempt to ameliorate some of her discomfort with exams. 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  A copy of this note was sent to the patient's referring provider.   80 minutes of total time was spent for this patient encounter, including preparation, face-to-face counseling with the patient and coordination of care, and documentation of the encounter.  Jeral Pinch, MD  Division of Gynecologic Oncology  Department of Obstetrics and Gynecology  [facility]  ___________________________________________  Chief Complaint: Chief Complaint  Patient presents with  . Endometrial Cancer    New Patient    History of Present Illness:  Emily [name] is a 59 y.o. y.o. female who is seen in consultation at the request of Dr. Marvel Plan for an evaluation of endometrial cancer.  The patient presented for an exam on 6/22.  At that time she endorsed episodes of postmenopausal bleeding starting in 17-Aug-2019 after her mother's death.  She had not had a menses in a number of years.  Patient's exam was limited by her intolerance in the position of the cervix.  Pap test was performed on 6/30 showing atypical glandular cells of undetermined significance. 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  Today, the patient reports have a heavy week of bleeding after her mother passed in late 2020. This was the first PMB that she had. She describes about a year long history previously of clear vaginal discharge. She saw her PCP for this complaint and given what was felt to be urethral swelling, she was treated for a UTI (lab testing did not show an infection). She has not been sexually active since her 69s. She endorses increased urinary frequency. After a year, the clear discharge became yellow (and at one point looked green). The patient was seen again and  a pap was attempted in the office but she was unable to tolerate this. She performed a vaginal swab on herself that found yeast. She used Monistat multiple times with improvement in her discharge. She then began to have pale pink discharge. At this time, she was seen by a Urogynecologist at [facility] and it sound like she had urodynamic testing.   She describes a raw feeling and burning with her urine. She continues to have vaginal discharge which she notes has an ammonia-like smell. Her vaginal bleeding started again about a month ago. She briefly tried vaginal estrogen after seeing the Urogynecologist but she felt that the bleeding worsened as did the ammonia smell, so she stopped.  Since her EMB, she continues to have bleeding (increased initially). She denies cramping but get intermittent sharp pains that she describes as starting and her uterus and shooting down to her cervix. She also has some back pain. She denies any changes to her bowel function. She reports a good appetite without nausea or emesis.   Her history of vaginal symptoms is long-standing. She was diagnosed with vaginitis in her 27s. Intercourse was painful for her until her early 63s (has not been sexually active since then).   PAST MEDICAL HISTORY:  Past Medical History:  Diagnosis Date  . Abnormal EKG 07-31-15  . Anxiety and depression   . Bruises easily   . Chest pain   . Chest pain with moderate risk of acute coronary syndrome   . Chest tightness   . Elevated cholesterol   . Endometrial cancer ([place])   . Family history of premature CAD 07/31/2015   Father had MI 17's, died at 28 of an  MI   . Fatigue   . Fibroid   . Fx sacrum/coccyx-closed ([place])   . Gastritis   . GERD (gastroesophageal reflux disease)   . Hyperlipidemia 07/16/2015  . Osteopenia   . Pigmented skin lesions   . Sinus problem   . SOB (shortness of breath) on exertion   . Vitamin D deficiency   . Weight gain      PAST SURGICAL HISTORY:  Past  Surgical History:  Procedure Laterality Date  . ADENOIDECTOMY    . TONSILLECTOMY      OB/GYN HISTORY:  OB History  Gravida Para Term Preterm AB Living  0 0 0 0 0 0  SAB TAB Ectopic Multiple Live Births  0 0 0 0 0    No LMP recorded. Patient is postmenopausal.  Age at menarche: 31  Age at menopause: around 89 Hx of HRT: yes, started last year Hx of STDs: denies Last pap: 02/2020, previously, was approximately 2006 History of abnormal pap smears: no  SCREENING STUDIES:  Last mammogram: 2019  Last colonoscopy: 2015 - polyps, benign Last bone mineral density: 2020  MEDICATIONS: Outpatient Encounter Medications as of 03/15/2020  Medication Sig  . CALCIUM PO Take by mouth.  . Omega-3 Fatty Acids (FISH OIL PO) Take by mouth.  . pantoprazole (PROTONIX) 40 MG tablet Take 40 mg by mouth as needed.   Marland Kitchen VITAMIN E PO Take by mouth.  Marland Kitchen ibuprofen (ADVIL) 800 MG tablet Take 1 tablet (800 mg total) by mouth every 8 (eight) hours as needed for moderate pain. For AFTER surgery only  . oxyCODONE (OXY IR/ROXICODONE) 5 MG immediate release tablet Take 1 tablet (5 mg total) by mouth every 4 (four) hours as needed for severe pain. For AFTER surgery, do not take and drive  . senna-docusate (SENOKOT-S) 8.6-50 MG tablet Take 2 tablets by mouth at bedtime. For AFTER surgery, do not take if having diarrhea  . [DISCONTINUED] Cholecalciferol (VITAMIN D3) LIQD Take by mouth daily.  (Patient not taking: Reported on 03/15/2020)  . [DISCONTINUED] lactobacillus acidophilus & bulgar (LACTINEX) chewable tablet CHEW 1 TABLET BY MOUTH 2 TIMES A DAY (Patient not taking: Reported on 03/15/2020)   No facility-administered encounter medications on file as of 03/15/2020.    ALLERGIES:  Allergies  Allergen Reactions  . Crestor [Rosuvastatin Calcium] Other (See Comments)    Causes calf pain  . Latex Other (See Comments)    Breaks out skin     FAMILY HISTORY:  Family History  Problem Relation Age of Onset  . Atrial  fibrillation Mother   . Hypertension Mother   . Stroke Mother   . Arthritis Mother   . Atrial fibrillation Father   . Heart attack Father 72  . Hypertension Father   . COPD Father   . Diabetes Father   . Colon cancer Paternal Grandmother   . Breast cancer Neg Hx      SOCIAL HISTORY:    Social Connections:   . Frequency of Communication with Friends and Family:   . Frequency of Social Gatherings with Friends and Family:   . Attends Religious Services:   . Active Member of Clubs or Organizations:   . Attends Archivist Meetings:   Marland Kitchen Marital Status:     REVIEW OF SYSTEMS:  +urinary frequency, pelvic pain, hot flashes, vaginal bleeding Denies appetite changes, fevers, chills, fatigue, unexplained weight changes. Denies hearing loss, neck lumps or masses, mouth sores, ringing in ears or voice changes. Denies cough or wheezing.  Denies shortness of breath. Denies chest pain or palpitations. Denies leg swelling. Denies abdominal distention, pain, blood in stools, constipation, diarrhea, nausea, vomiting, or early satiety. Denies pain with intercourse, dysuria, hematuria or incontinence. Denies vaginal discharge.   Denies joint pain, back pain or muscle pain/cramps. Denies itching, rash, or wounds. Denies dizziness, headaches, numbness or seizures. Denies swollen lymph nodes or glands, denies easy bruising or bleeding. Denies anxiety, depression, confusion, or decreased concentration.  Physical Exam:  Vital Signs for this encounter:  Blood pressure 119/75, pulse 90, temperature 98 F (36.7 C), temperature source Oral, resp. rate 16, height 5\' 4"  (1.626 m), weight 144 lb (65.3 kg), SpO2 99 %. Body mass index is 24.72 kg/m. General: Alert, oriented, no acute distress.  HEENT: Normocephalic, atraumatic. Sclera anicteric.  Chest: Clear to auscultation bilaterally. No wheezes, rhonchi, or rales. Cardiovascular: Regular rate and rhythm, no murmurs, rubs, or gallops.   Abdomen: Soft, nondistended, nontender to palpation. No masses or hepatosplenomegaly appreciated. No palpable fluid wave.  Extremities: Grossly normal range of motion. Warm, well perfused. No edema bilaterally.  Skin: No rashes or lesions.  Lymphatics: No cervical, supraclavicular, or inguinal adenopathy.  GU:  Normal external female genitalia. Skin of labia minora with several small separations and fragile in appearance.   No discharge or bleeding.             Bladder/urethra:  Urethral somewhat prominent, edematous and hyperemic. No distinct masses or atypical vessels.             Vagina: With one finger, patient tolerated exam poorly due to significant pain with insertion of finger past the hymen. 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 Once past the hymen, the patient has no significant discomfort. Unable to palpate the cervix.             Cervix/uterus: uterus small and mobile appreciated with abdominal hand. Unable to appreciate cervix with vaginal hand.  LABORATORY AND RADIOLOGIC DATA:  Outside medical records were reviewed to synthesize the above history, along with the history and physical obtained during the visit.   Lab Results  Component Value Date   WBC 9.7 03/29/2017   HGB 12.8 03/29/2017   HCT 36.4 03/29/2017   PLT 227 03/29/2017   GLUCOSE 100 (H) 03/29/2017   CHOL 205 (H) 10/17/2015   TRIG 163 (H) 10/17/2015   HDL 61 10/17/2015   LDLCALC 111 10/17/2015   ALT 18 03/29/2017   AST 21 03/29/2017   NA 134 (L) 03/29/2017   K 3.8 03/29/2017   CL 99 (L) 03/29/2017   CREATININE 0.76 03/29/2017   BUN 12 03/29/2017   CO2 25 03/29/2017   Pelvic ultrasound in 2016:  FINDINGS: Uterus  Measurements: 7.4 x 3.7 x 3.3 cm. 2.3 x 2.0 x 2.0 cm posteriorly fibroid.  Endometrium  Thickness: 4.1 mm.  No focal abnormality visualized.  Right ovary  Measurements: 2.7 x 1.4 x 1.8 cm. Normal appearance/no adnexal  mass.  Left ovary  Not visualized due to overlying bowel gas.  Other findings  No free fluid.  Transvaginal images limited due to pain.  IMPRESSION: 2.3 cm uterine fibroid.

## 2020-03-15 NOTE — Patient Instructions (Addendum)
Plan to have an ultrasound before surgery and we will contact you with the results. You can buy some frozen peas and separate them into several ziploc bags and keep them in the freezer so you will have them ready if you experience vulvar discomfort after surgery.  Preparing for your Surgery  Plan for surgery on March 27, 2020 with Dr. Jeral Pinch at [facility] will be scheduled for a robotic assisted total laparoscopic hysterectomy, bilateral salpingo-oophorectomy, sentinel lymph node biopsy, possible lymph node dissection, possible laparotomy.   STOP TAKING FISH OIL NOW  Pre-operative Testing -You will receive a phone call from presurgical testing at [facility] to arrange for a pre-operative appointment over the phone, lab appointment, and COVID test. The COVID test normally happens 3 days prior to the surgery and they ask that you self quarantine after the test up until surgery to decrease chance of exposure.  -Bring your insurance card, copy of an advanced directive if applicable, medication list  -At that visit, you will be asked to sign a consent for a possible blood transfusion in case a transfusion becomes necessary during surgery.  The need for a blood transfusion is rare but having consent is a necessary part of your care.     -You should not be taking blood thinners or aspirin at least ten days prior to surgery unless instructed by your surgeon.  -Do not take supplements such as fish oil (omega 3), red yeast rice, turmeric before your surgery.   Day Before Surgery at [facility] will be asked to take in a light diet the day before surgery. You will be advised you can have clear liquids after midnight and up until 3 hours before your surgery.    Eat a light diet the day before surgery.  Examples including soups, broths, toast, yogurt, mashed potatoes.  AVOID GAS PRODUCING FOODS. Things to avoid include carbonated beverages (fizzy beverages), raw fruits and  raw vegetables, or beans.   If your bowels are filled with gas, your surgeon will have difficulty visualizing your pelvic organs which increases your surgical risks.  Your role in recovery Your role is to become active as soon as directed by your doctor, while still giving yourself time to heal.  Rest when you feel tired. You will be asked to do the following in order to speed your recovery:  - Cough and breathe deeply. This helps to clear and expand your lungs and can prevent pneumonia after surgery.  - [place]. Do mild physical activity. Walking or moving your legs help your circulation and body functions return to normal. Do not try to get up or walk alone the first time after surgery.   -If you develop swelling on one leg or the other, pain in the back of your leg, redness/warmth in one of your legs, please call the office or go to the Emergency Room to have a doppler to rule out a blood clot. For shortness of breath, chest pain-seek care in the Emergency Room as soon as possible. - Actively manage your pain. Managing your pain lets you move in comfort. We will ask you to rate your pain on a scale of zero to 10. It is your responsibility to tell your doctor or nurse where and how much you hurt so your pain can be treated.  Special Considerations -If you are diabetic, you may be placed on insulin after surgery to have closer control over your blood sugars  to promote healing and recovery.  This does not mean that you will be discharged on insulin.  If applicable, your oral antidiabetics will be resumed when you are tolerating a solid diet.  -Your final pathology results from surgery should be available around one week after surgery and the results will be relayed to you when available.  -Dr. Lahoma Crocker is the surgeon that assists your GYN Oncologist with surgery.  If you end up staying the night, the next day after your surgery you will either see [name], Dr.  Berline Lopes, or Dr. Lahoma Crocker.  -FMLA forms can be faxed to [phone] and please allow 5-7 business days for completion.  Pain Management After Surgery -You have been prescribed your pain medication and bowel regimen medications before surgery so that you can have these available when you are discharged from the hospital. The pain medication is for use ONLY AFTER surgery and a new prescription will not be given.   -Make sure that you have Tylenol and Ibuprofen at home to use on a regular basis after surgery for pain control. We recommend alternating the medications every hour to six hours since they work differently and are processed in the body differently for pain relief.  -Review the attached handout on narcotic use and their risks and side effects.   Bowel Regimen -You have been prescribed Sennakot-S to take nightly to prevent constipation especially if you are taking the narcotic pain medication intermittently.  It is important to prevent constipation and drink adequate amounts of liquids. You can stop taking this medication when you are not taking pain medication and you are back on your normal bowel routine.  Risks of Surgery Risks of surgery are low but include bleeding, infection, damage to surrounding structures, re-operation, blood clots, and very rarely death.   Blood Transfusion Information (For the consent to be signed before surgery)  We will be checking your blood type before surgery so in case of emergencies, we will know what type of blood you would need.                                            WHAT IS A BLOOD TRANSFUSION?  A transfusion is the replacement of blood or some of its parts. Blood is made up of multiple cells which provide different functions.  Red blood cells carry oxygen and are used for blood loss replacement.  White blood cells fight against infection.  Platelets control bleeding.  Plasma helps clot blood.  Other blood products are  available for specialized needs, such as hemophilia or other clotting disorders. BEFORE THE TRANSFUSION  Who gives blood for transfusions?   You may be able to donate blood to be used at a later date on yourself (autologous donation).  Relatives can be asked to donate blood. This is generally not any safer than if you have received blood from a stranger. The same precautions are taken to ensure safety when a relative's blood is donated.  Healthy volunteers who are fully evaluated to make sure their blood is safe. This is blood bank blood. Transfusion therapy is the safest it has ever been in the practice of medicine. Before blood is taken from a donor, a complete history is taken to make sure that person has no history of diseases nor engages in risky social behavior (examples are intravenous drug use or sexual activity  with multiple partners). The donor's travel history is screened to minimize risk of transmitting infections, such as malaria. The donated blood is tested for signs of infectious diseases, such as HIV and hepatitis. The blood is then tested to be sure it is compatible with you in order to minimize the chance of a transfusion reaction. If you or a relative donates blood, this is often done in anticipation of surgery and is not appropriate for emergency situations. It takes many days to process the donated blood. RISKS AND COMPLICATIONS Although transfusion therapy is very safe and saves many lives, the main dangers of transfusion include:   Getting an infectious disease.  Developing a transfusion reaction. This is an allergic reaction to something in the blood you were given. Every precaution is taken to prevent this. The decision to have a blood transfusion has been considered carefully by your caregiver before blood is given. Blood is not given unless the benefits outweigh the risks.  AFTER SURGERY INSTRUCTIONS  Return to work: 4-6 weeks if applicable  Activity: 1. Be up and  out of the bed during the day.  Take a nap if needed.  You may walk up steps but be careful and use the hand rail.  Stair climbing will tire you more than you think, you may need to stop part way and rest.   2. No lifting or straining for 6 weeks over 10 pounds. No pushing, pulling, straining for 6 weeks.  3. No driving for 1 week(s).  Do not drive if you are taking narcotic pain medicine and make sure that your reaction time has returned.   4. You can shower as soon as the next day after surgery. Shower daily.  Use soap and water on your incision and pat dry; don't rub.  No tub baths or submerging your body in water until cleared by your surgeon. If you have the soap that was given to you by pre-surgical testing that was used before surgery, you do not need to use it afterwards because this can irritate your incisions.   5. No sexual activity and nothing in the vagina for 8 weeks.  6. You may experience a small amount of clear drainage from your incisions, which is normal.  If the drainage persists, increases, or changes color please call the office.  7. Do not use creams, lotions, or ointments such as neosporin on your incisions after surgery until advised by your surgeon because they can cause removal of the dermabond glue on your incisions.    8. You may experience vaginal spotting after surgery or around the 6-8 week mark from surgery when the stitches at the top of the vagina begin to dissolve.  The spotting is normal but if you experience heavy bleeding, call our office.  9. Take Tylenol or ibuprofen first for pain and only use narcotic pain medication for severe pain not relieved by the Tylenol or Ibuprofen.  Monitor your Tylenol intake to a max of 4,000 mg in a 24 hour period. You can alternate these medications after surgery.  Diet: 1. Low sodium Heart Healthy Diet is recommended.  2. It is safe to use a laxative, such as Miralax or Colace, if you have difficulty moving your bowels.  You have been prescribed Sennakot at bedtime every evening to keep bowel movements regular and to prevent constipation.    Wound Care: 1. Keep clean and dry.  Shower daily.  Reasons to call the Doctor:  Fever - Oral temperature greater than  100.4 degrees Fahrenheit  Foul-smelling vaginal discharge  Difficulty urinating  Nausea and vomiting  Increased pain at the site of the incision that is unrelieved with pain medicine.  Difficulty breathing with or without chest pain  New calf pain especially if only on one side  Sudden, continuing increased vaginal bleeding with or without clots.   Contacts: For questions or concerns you should contact:  Dr. Jeral Pinch at [phone]  Joylene John, NP at [phone]  After Hours: call [phone] and have the GYN Oncologist paged/contacted

## 2020-03-15 NOTE — Progress Notes (Signed)
GYNECOLOGIC ONCOLOGY NEW PATIENT CONSULTATION   Patient Name: Emily [name]  Patient Age: 59 y.o. Date of Service: 03/15/20 Referring Provider: Dr. Marvel Plan  Primary Care Provider: [name], MD Consulting Provider: Jeral Pinch, MD   Assessment/Plan:  Postmenopausal patient with newly diagnosed uterine cancer.   We reviewed the nature of endometrial cancer and its recommended surgical staging, including total hysterectomy, bilateral salpingo-oophorectomy, and lymph node assessment. The patient is a suitable candidate for staging via a minimally invasive approach to surgery.  We reviewed that robotic assistance would be used to complete the surgery.   We discussed that most endometrial cancer is detected early and that decisions regarding adjuvant therapy will be made based on her final pathology.  Given my limited exam, will plan to get a transabdominal ultrasound prior to surgery.  We reviewed the sentinel lymph node technique. Risks and benefits of sentinel lymph node biopsy was reviewed. We reviewed the technique and ICG dye. The patient DOES NOT have an iodine allergy or known liver dysfunction. We reviewed the false negative rate (0.4%), and that 3% of patients with metastatic disease will not have it detected by SLN biopsy in endometrial cancer. A low risk of allergic reaction to the dye, <0.2% for ICG, has been reported. We also discussed that in the case of failed mapping, which occurs 40% of the time, a bilateral or unilateral lymphadenectomy will be performed at the surgeon's discretion.   Potential benefits of sentinel nodes including a higher detection rate for metastasis due to ultrastaging and potential reduction in operative morbidity. However, there remains uncertainty as to the role for treatment of micrometastatic disease. Further, the benefit of operative morbidity associated with the SLN technique in endometrial cancer is not yet completely known. In other patient  populations (e.g. the cervical cancer population) there has been observed reductions in morbidity with SLN biopsy compared to pelvic lymphadenectomy. Lymphedema, nerve dysfunction and lymphocysts are all potential risks with the SLN technique as with complete lymphadenectomy. Additional risks to the patient include the risk of damage to an internal organ while operating in an altered view (e.g. the black and white image of the robotic fluorescence imaging mode).   We discussed the plan for a robotic assisted hysterectomy, bilateral salpingo-oophorectomy, sentinel lymph node evaluation, possible lymph node dissection, possible laparotomy, likely episiotomy, possible urethral biopsy, possible vaginal biopsy. The risks of surgery were discussed in detail and she understands these to include infection; wound separation; hernia; vaginal cuff separation, injury to adjacent organs such as bowel, bladder, blood vessels, ureters and nerves; bleeding which may require blood transfusion; anesthesia risk; thromboembolic events; possible death; unforeseen complications; possible need for re-exploration; medical complications such as heart attack, stroke, pleural effusion and pneumonia; and, if full lymphadenectomy is performed the risk of lymphedema and lymphocyst. The patient will receive DVT and antibiotic prophylaxis as indicated. She voiced a clear understanding. She had the opportunity to ask questions. Perioperative instructions were reviewed with her. Prescriptions for post-op medications were sent to her pharmacy of choice.  Given the appearance of her urethra on exam today (I suspect related to atrophy), I reviewed the possibility of biopsy at the time of surgery. We also reviewed her pain with vaginal insertion (suspect that this is related to her narrow introitus). I recommended an episiotomy, both for placement of a uterine manipulator but also as an attempt to ameliorate some of her discomfort with exams. 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  A copy of this note was sent to the patient's referring provider.   80 minutes of total time was spent for this patient encounter, including preparation, face-to-face counseling with the patient and coordination of care, and documentation of the encounter.  Jeral Pinch, MD  Division of Gynecologic Oncology  Department of Obstetrics and Gynecology  [facility]  ___________________________________________  Chief Complaint: Chief Complaint  Patient presents with  . Endometrial Cancer    New Patient    History of Present Illness:  Emily [name] is a 59 y.o. y.o. female who is seen in consultation at the request of Dr. Marvel Plan for an evaluation of endometrial cancer.  The patient presented for an exam on 6/22.  At that time she endorsed episodes of postmenopausal bleeding starting in 08/24/19 after her mother's death.  She had not had a menses in a number of years.  Patient's exam was limited by her intolerance in the position of the cervix.  Pap test was performed on 6/30 showing atypical glandular cells of undetermined significance. 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  Today, the patient reports have a heavy week of bleeding after her mother passed in late 2020. This was the first PMB that she had. She describes about a year long history previously of clear vaginal discharge. She saw her PCP for this complaint and given what was felt to be urethral swelling, she was treated for a UTI (lab testing did not show an infection). She has not been sexually active since her 21s. She endorses increased urinary frequency. After a year, the clear discharge became yellow (and at one point looked green). The patient was seen again and  a pap was attempted in the office but she was unable to tolerate this. She performed a vaginal swab on herself that found yeast. She used Monistat multiple times with improvement in her discharge. She then began to have pale pink discharge. At this time, she was seen by a Urogynecologist at [facility] and it sound like she had urodynamic testing.   She describes a raw feeling and burning with her urine. She continues to have vaginal discharge which she notes has an ammonia-like smell. Her vaginal bleeding started again about a month ago. She briefly tried vaginal estrogen after seeing the Urogynecologist but she felt that the bleeding worsened as did the ammonia smell, so she stopped.  Since her EMB, she continues to have bleeding (increased initially). She denies cramping but get intermittent sharp pains that she describes as starting and her uterus and shooting down to her cervix. She also has some back pain. She denies any changes to her bowel function. She reports a good appetite without nausea or emesis.   Her history of vaginal symptoms is long-standing. She was diagnosed with vaginitis in her 24s. Intercourse was painful for her until her early 45s (has not been sexually active since then).   PAST MEDICAL HISTORY:  Past Medical History:  Diagnosis Date  . Abnormal EKG 08-07-15  . Anxiety and depression   . Bruises easily   . Chest pain   . Chest pain with moderate risk of acute coronary syndrome   . Chest tightness   . Elevated cholesterol   . Endometrial cancer ([place])   . Family history of premature CAD August 07, 2015   Father had MI 8's, died at 98 of an  MI   . Fatigue   . Fibroid   . Fx sacrum/coccyx-closed ([place])   . Gastritis   . GERD (gastroesophageal reflux disease)   . Hyperlipidemia 07/16/2015  . Osteopenia   . Pigmented skin lesions   . Sinus problem   . SOB (shortness of breath) on exertion   . Vitamin D deficiency   . Weight gain      PAST SURGICAL HISTORY:  Past  Surgical History:  Procedure Laterality Date  . ADENOIDECTOMY    . TONSILLECTOMY      OB/GYN HISTORY:  OB History  Gravida Para Term Preterm AB Living  0 0 0 0 0 0  SAB TAB Ectopic Multiple Live Births  0 0 0 0 0    No LMP recorded. Patient is postmenopausal.  Age at menarche: 27  Age at menopause: around 22 Hx of HRT: yes, started last year Hx of STDs: denies Last pap: 02/2020, previously, was approximately 2006 History of abnormal pap smears: no  SCREENING STUDIES:  Last mammogram: 2019  Last colonoscopy: 2015 - polyps, benign Last bone mineral density: 2020  MEDICATIONS: Outpatient Encounter Medications as of 03/15/2020  Medication Sig  . CALCIUM PO Take by mouth.  . Omega-3 Fatty Acids (FISH OIL PO) Take by mouth.  . pantoprazole (PROTONIX) 40 MG tablet Take 40 mg by mouth as needed.   Marland Kitchen VITAMIN E PO Take by mouth.  Marland Kitchen ibuprofen (ADVIL) 800 MG tablet Take 1 tablet (800 mg total) by mouth every 8 (eight) hours as needed for moderate pain. For AFTER surgery only  . oxyCODONE (OXY IR/ROXICODONE) 5 MG immediate release tablet Take 1 tablet (5 mg total) by mouth every 4 (four) hours as needed for severe pain. For AFTER surgery, do not take and drive  . senna-docusate (SENOKOT-S) 8.6-50 MG tablet Take 2 tablets by mouth at bedtime. For AFTER surgery, do not take if having diarrhea  . [DISCONTINUED] Cholecalciferol (VITAMIN D3) LIQD Take by mouth daily.  (Patient not taking: Reported on 03/15/2020)  . [DISCONTINUED] lactobacillus acidophilus & bulgar (LACTINEX) chewable tablet CHEW 1 TABLET BY MOUTH 2 TIMES A DAY (Patient not taking: Reported on 03/15/2020)   No facility-administered encounter medications on file as of 03/15/2020.    ALLERGIES:  Allergies  Allergen Reactions  . Crestor [Rosuvastatin Calcium] Other (See Comments)    Causes calf pain  . Latex Other (See Comments)    Breaks out skin     FAMILY HISTORY:  Family History  Problem Relation Age of Onset  . Atrial  fibrillation Mother   . Hypertension Mother   . Stroke Mother   . Arthritis Mother   . Atrial fibrillation Father   . Heart attack Father 82  . Hypertension Father   . COPD Father   . Diabetes Father   . Colon cancer Paternal Grandmother   . Breast cancer Neg Hx      SOCIAL HISTORY:    Social Connections:   . Frequency of Communication with Friends and Family:   . Frequency of Social Gatherings with Friends and Family:   . Attends Religious Services:   . Active Member of Clubs or Organizations:   . Attends Archivist Meetings:   Marland Kitchen Marital Status:     REVIEW OF SYSTEMS:  +urinary frequency, pelvic pain, hot flashes, vaginal bleeding Denies appetite changes, fevers, chills, fatigue, unexplained weight changes. Denies hearing loss, neck lumps or masses, mouth sores, ringing in ears or voice changes. Denies cough or wheezing.  Denies shortness of breath. Denies chest pain or palpitations. Denies leg swelling. Denies abdominal distention, pain, blood in stools, constipation, diarrhea, nausea, vomiting, or early satiety. Denies pain with intercourse, dysuria, hematuria or incontinence. Denies vaginal discharge.   Denies joint pain, back pain or muscle pain/cramps. Denies itching, rash, or wounds. Denies dizziness, headaches, numbness or seizures. Denies swollen lymph nodes or glands, denies easy bruising or bleeding. Denies anxiety, depression, confusion, or decreased concentration.  Physical Exam:  Vital Signs for this encounter:  Blood pressure 119/75, pulse 90, temperature 98 F (36.7 C), temperature source Oral, resp. rate 16, height 5\' 4"  (1.626 m), weight 144 lb (65.3 kg), SpO2 99 %. Body mass index is 24.72 kg/m. General: Alert, oriented, no acute distress.  HEENT: Normocephalic, atraumatic. Sclera anicteric.  Chest: Clear to auscultation bilaterally. No wheezes, rhonchi, or rales. Cardiovascular: Regular rate and rhythm, no murmurs, rubs, or gallops.   Abdomen: Soft, nondistended, nontender to palpation. No masses or hepatosplenomegaly appreciated. No palpable fluid wave.  Extremities: Grossly normal range of motion. Warm, well perfused. No edema bilaterally.  Skin: No rashes or lesions.  Lymphatics: No cervical, supraclavicular, or inguinal adenopathy.  GU:  Normal external female genitalia. Skin of labia minora with several small separations and fragile in appearance.   No discharge or bleeding.             Bladder/urethra:  Urethral somewhat prominent, edematous and hyperemic. No distinct masses or atypical vessels.             Vagina: With one finger, patient tolerated exam poorly due to significant pain with insertion of finger past the hymen. 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 Once past the hymen, the patient has no significant discomfort. Unable to palpate the cervix.             Cervix/uterus: uterus small and mobile appreciated with abdominal hand. Unable to appreciate cervix with vaginal hand.  LABORATORY AND RADIOLOGIC DATA:  Outside medical records were reviewed to synthesize the above history, along with the history and physical obtained during the visit.   Lab Results  Component Value Date   WBC 9.7 03/29/2017   HGB 12.8 03/29/2017   HCT 36.4 03/29/2017   PLT 227 03/29/2017   GLUCOSE 100 (H) 03/29/2017   CHOL 205 (H) 10/17/2015   TRIG 163 (H) 10/17/2015   HDL 61 10/17/2015   LDLCALC 111 10/17/2015   ALT 18 03/29/2017   AST 21 03/29/2017   NA 134 (L) 03/29/2017   K 3.8 03/29/2017   CL 99 (L) 03/29/2017   CREATININE 0.76 03/29/2017   BUN 12 03/29/2017   CO2 25 03/29/2017   Pelvic ultrasound in 2016:  FINDINGS: Uterus  Measurements: 7.4 x 3.7 x 3.3 cm. 2.3 x 2.0 x 2.0 cm posteriorly fibroid.  Endometrium  Thickness: 4.1 mm.  No focal abnormality visualized.  Right ovary  Measurements: 2.7 x 1.4 x 1.8 cm. Normal appearance/no adnexal  mass.  Left ovary  Not visualized due to overlying bowel gas.  Other findings  No free fluid.  Transvaginal images limited due to pain.  IMPRESSION: 2.3 cm uterine fibroid.

## 2020-03-15 NOTE — Telephone Encounter (Signed)
Returned call to patient.  Addressed concern about a possible fistula causing her vaginal discharge with ammonia smell. All questions answered.  Advised to call for any questions or concerns.

## 2020-03-16 DIAGNOSIS — C541 Malignant neoplasm of endometrium: Secondary | ICD-10-CM | POA: Insufficient documentation

## 2020-03-16 NOTE — Progress Notes (Addendum)
DUE TO COVID-19 ONLY ONE VISITOR IS ALLOWED TO COME WITH YOU AND STAY IN THE WAITING ROOM ONLY DURING PRE OP AND PROCEDURE DAY OF SURGERY. THE 1 VISITOR MAY VISIT WITH YOU AFTER SURGERY IN YOUR PRIVATE ROOM DURING VISITING HOURS ONLY!  YOU NEED TO HAVE A COVID 19 TEST ON___7/16/21 ____ @___0900____ , THIS TEST MUST BE DONE BEFORE SURGERY, COME  [address], [place]  , [id number].  ([place]) ONCE YOUR COVID TEST IS COMPLETED, PLEASE BEGIN THE QUARANTINE INSTRUCTIONS AS OUTLINED IN YOUR HANDOUT.                Emily [name]  03/16/2020   Your procedure is scheduled on:       03/27/20  Report to [facility] Main  Entrance   Report to admitting at    1100 AM     Call this number if you have problems the morning of surgery [phone]    Remember: Do not eat food    :After Midnight. BRUSH YOUR TEETH MORNING OF SURGERY AND RINSE YOUR MOUTH OUT, NO CHEWING GUM CANDY OR MINTS. MAY HAVE CLEAR LIQUIDS FROM 12MIDNITE UNTIL 1000AM MORNING OF SURGERY THEN NOTHING BY MOUTH.     Take these medicines the morning of surgery with A SIP OF WATER: NONE                                  You may not have any metal on your body including hair pins and              piercings  Do not wear jewelry, make-up, lotions, powders or perfumes, deodorant             Do not wear nail polish on your fingernails.  Do not shave  48 hours prior to surgery.     Do not bring valuables to the hospital. [place].  Contacts, dentures or bridgework may not be worn into surgery.  Leave suitcase in the car. After surgery it may be brought to your room.     Patients discharged the day of surgery will not be allowed to drive home. IF YOU ARE HAVING SURGERY AND GOING HOME THE SAME DAY, YOU MUST HAVE AN ADULT TO DRIVE YOU HOME AND BE WITH YOU FOR 24 HOURS. YOU MAY GO HOME BY TAXI OR UBER OR ORTHERWISE, BUT AN ADULT MUST ACCOMPANY YOU HOME AND STAY  WITH YOU FOR 24 HOURS.  Name and phone number of your driver:               Please read over the following fact sheets you were given: _____________________________________________________________________                CLEAR LIQUID DIET   Foods Allowed                                                                     Foods Excluded  Coffee and tea, regular and decaf  liquids that you cannot  Plain Jell-O any favor except red or purple                                           see through such as: Fruit ices (not with fruit pulp)                                     milk, soups, orange juice  Iced Popsicles                                    All solid food Carbonated beverages, regular and diet                                    Cranberry, grape and apple juices Sports drinks like Gatorade Lightly seasoned clear broth or consume(fat free) Sugar, honey syrup                                         _____________________________________________________________________  [facility] - Preparing for Surgery Before surgery, you can play an important role.  Because skin is not sterile, your skin needs to be as free of germs as possible.  You can reduce the number of germs on your skin by washing with CHG (chlorahexidine gluconate) soap before surgery.  CHG is an antiseptic cleaner which kills germs and bonds with the skin to continue killing germs even after washing. Please DO NOT use if you have an allergy to CHG or antibacterial soaps.  If your skin becomes reddened/irritated stop using the CHG and inform your nurse when you arrive at Short Stay. Do not shave (including legs and underarms) for at least 48 hours prior to the first CHG shower.  You may shave your face/neck. Please follow these instructions carefully:  1.  Shower with CHG Soap the night before surgery and the  morning of Surgery.  2.  If you choose to wash your hair, wash your hair first as  usual with your  normal  shampoo.  3.  After you shampoo, rinse your hair and body thoroughly to remove the  shampoo.                           4.  Use CHG as you would any other liquid soap.  You can apply chg directly  to the skin and wash                       Gently with a scrungie or clean washcloth.  5.  Apply the CHG Soap to your body ONLY FROM THE NECK DOWN.   Do not use on face/ open                           Wound or open sores. Avoid contact with eyes, ears mouth and genitals (private parts).  Wash face,  Genitals (private parts) with your normal soap.             6.  Wash thoroughly, paying special attention to the area where your surgery  will be performed.  7.  Thoroughly rinse your body with warm water from the neck down.  8.  DO NOT shower/wash with your normal soap after using and rinsing off  the CHG Soap.                9.  Pat yourself dry with a clean towel.            10.  Wear clean pajamas.            11.  Place clean sheets on your bed the night of your first shower and do not  sleep with pets. Day of Surgery : Do not apply any lotions/deodorants the morning of surgery.  Please wear clean clothes to the hospital/surgery center.  FAILURE TO FOLLOW THESE INSTRUCTIONS MAY RESULT IN THE CANCELLATION OF YOUR SURGERY PATIENT SIGNATURE_________________________________  NURSE SIGNATURE__________________________________  ________________________________________________________________________

## 2020-03-19 ENCOUNTER — Telehealth: Payer: Self-pay | Admitting: *Deleted

## 2020-03-19 NOTE — Telephone Encounter (Signed)
Per patient request, refaxed FMLA to [employer] with the patient's card

## 2020-03-21 ENCOUNTER — Other Ambulatory Visit: Payer: Self-pay

## 2020-03-21 ENCOUNTER — Ambulatory Visit (HOSPITAL_COMMUNITY)
Admission: RE | Admit: 2020-03-21 | Discharge: 2020-03-21 | Disposition: A | Discharge status: Home / Self Care | Payer: 59 | Source: Ambulatory Visit | Attending: Gynecologic Oncology | Admitting: Gynecologic Oncology

## 2020-03-21 DIAGNOSIS — N854 Malposition of uterus: Secondary | ICD-10-CM | POA: Diagnosis not present

## 2020-03-21 DIAGNOSIS — R9389 Abnormal findings on diagnostic imaging of other specified body structures: Secondary | ICD-10-CM | POA: Diagnosis not present

## 2020-03-21 DIAGNOSIS — N950 Postmenopausal bleeding: Secondary | ICD-10-CM | POA: Diagnosis not present

## 2020-03-21 DIAGNOSIS — C541 Malignant neoplasm of endometrium: Secondary | ICD-10-CM | POA: Diagnosis not present

## 2020-03-21 DIAGNOSIS — D252 Subserosal leiomyoma of uterus: Secondary | ICD-10-CM | POA: Diagnosis not present

## 2020-03-22 ENCOUNTER — Telehealth: Payer: Self-pay | Admitting: Gynecologic Oncology

## 2020-03-22 NOTE — Telephone Encounter (Signed)
Returned call to patient and left message asking her to please call the office to go over her questions in regards to the recent US.

## 2020-03-23 ENCOUNTER — Other Ambulatory Visit: Payer: Self-pay

## 2020-03-23 ENCOUNTER — Encounter (HOSPITAL_COMMUNITY)
Admission: RE | Admit: 2020-03-23 | Discharge: 2020-03-23 | Disposition: A | Discharge status: Home / Self Care | Payer: 59 | Source: Ambulatory Visit | Attending: Gynecologic Oncology | Admitting: Gynecologic Oncology

## 2020-03-23 ENCOUNTER — Telehealth: Payer: Self-pay | Admitting: Gynecologic Oncology

## 2020-03-23 ENCOUNTER — Inpatient Hospital Stay (HOSPITAL_COMMUNITY): Admission: RE | Admit: 2020-03-23 | Payer: 59 | Source: Ambulatory Visit

## 2020-03-23 ENCOUNTER — Encounter (HOSPITAL_COMMUNITY): Payer: Self-pay

## 2020-03-23 DIAGNOSIS — Z01812 Encounter for preprocedural laboratory examination: Secondary | ICD-10-CM | POA: Insufficient documentation

## 2020-03-23 HISTORY — DX: Urethral disorder, unspecified: N36.9

## 2020-03-23 HISTORY — DX: Prediabetes: R73.03

## 2020-03-23 HISTORY — DX: Failed or difficult intubation, initial encounter: T88.4XXA

## 2020-03-23 LAB — COMPREHENSIVE METABOLIC PANEL
ALT: 13 U/L (ref 0–44)
AST: 18 U/L (ref 15–41)
Albumin: 4.3 g/dL (ref 3.5–5.0)
Alkaline Phosphatase: 62 U/L (ref 38–126)
Anion gap: 9 (ref 5–15)
BUN: 12 mg/dL (ref 6–20)
CO2: 26 mmol/L (ref 22–32)
Calcium: 8.9 mg/dL (ref 8.9–10.3)
Chloride: 104 mmol/L (ref 98–111)
Creatinine, Ser: 0.81 mg/dL (ref 0.44–1.00)
GFR calc Af Amer: >60 mL/min (ref >60)
GFR calc non Af Amer: >60 mL/min (ref >60)
Glucose, Bld: 96 mg/dL (ref 70–99)
Potassium: 4.1 mmol/L (ref 3.5–5.1)
Sodium: 139 mmol/L (ref 135–145)
Total Bilirubin: 0.7 mg/dL (ref 0.3–1.2)
Total Protein: 7.2 g/dL (ref 6.5–8.1)

## 2020-03-23 LAB — CBC
HCT: 39.7 % (ref 36.0–46.0)
Hemoglobin: 12.6 g/dL (ref 12.0–15.0)
MCH: 27.6 pg (ref 26.0–34.0)
MCHC: 31.7 g/dL (ref 30.0–36.0)
MCV: 87.1 fL (ref 80.0–100.0)
Platelets: 290 10*3/uL (ref 150–400)
RBC: 4.56 MIL/uL (ref 3.87–5.11)
RDW: 13.5 % (ref 11.5–15.5)
WBC: 6.8 10*3/uL (ref 4.0–10.5)
nRBC: 0 % (ref 0.0–0.2)

## 2020-03-23 LAB — URINALYSIS, ROUTINE W REFLEX MICROSCOPIC
Bilirubin Urine: NEGATIVE
Glucose, UA: NEGATIVE mg/dL
Ketones, ur: NEGATIVE mg/dL
Nitrite: NEGATIVE
Protein, ur: NEGATIVE mg/dL
Specific Gravity, Urine: 1.005 (ref 1.005–1.030)
pH: 6 (ref 5.0–8.0)

## 2020-03-23 MED FILL — PANTOPRAZOLE SOD DR 40 MG T: 40 | 90 days supply | Qty: 90 | Fill #0

## 2020-03-23 NOTE — Progress Notes (Signed)
U/A results faxed via epic to DR Jeral Pinch and to [facility]

## 2020-03-23 NOTE — Telephone Encounter (Signed)
Spoke with patient.  All questions answered.  Dr. Berline Lopes will be informed of the patient's concerns including a skin lesion noted on her mons after shaving along with intermittent shooting pains down through her labia. Advised to call for any needs or concerns.  She would like to have her sister, Loletha Carrow, contacted after the procedure.

## 2020-03-23 NOTE — Progress Notes (Signed)
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  Prediabetes- HGBA1C- 6.0 on 02/29/20

## 2020-03-26 ENCOUNTER — Other Ambulatory Visit (HOSPITAL_COMMUNITY)
Admission: RE | Admit: 2020-03-26 | Discharge: 2020-03-26 | Disposition: A | Discharge status: Home / Self Care | Payer: 59 | Source: Ambulatory Visit | Attending: Gynecologic Oncology | Admitting: Gynecologic Oncology

## 2020-03-26 ENCOUNTER — Telehealth: Payer: Self-pay

## 2020-03-26 DIAGNOSIS — Z20822 Contact with and (suspected) exposure to covid-19: Secondary | ICD-10-CM | POA: Diagnosis not present

## 2020-03-26 DIAGNOSIS — Z01812 Encounter for preprocedural laboratory examination: Secondary | ICD-10-CM | POA: Diagnosis not present

## 2020-03-26 LAB — SARS CORONAVIRUS 2 (TAT 6-24 HRS): SARS Coronavirus 2: NEGATIVE

## 2020-03-26 NOTE — Telephone Encounter (Signed)
Ms Hulon states that she understands her written pre-op instructions.  She will arrive at [facility] admitting at 0900. Can have clear liquids after MN until  0800 03-27-20. Told Ms Barnfield that Dr. Berline Lopes will be able to inform her sister [name] of all the intra operative findings without signing a ROI.

## 2020-03-26 NOTE — Progress Notes (Addendum)
Anesthesia Chart Review   Case: [id number] Date/Time: 03/27/20 1045   Procedures:      XI ROBOTIC ASSISTED TOTAL HYSTERECTOMY WITH BILATERAL SALPINGO OOPHORECTOMY (Bilateral )     SENTINEL NODE BIOPSY, POSSIBLE LYMPH NODE DISSECTION, POSSIBLE LAPAROTOMY URETHRAL AND VAGINAL BIOPSY (N/A )   Anesthesia type: General   Pre-op diagnosis: ENDOMETRIAL CANCER   Location: [facility] ROOM 03 / [facility]   Surgeons: [name], MD      DISCUSSION:59 y.o. never smoker with h/o HLD, GERD, pre-diabetes, endometrial cancer scheduled for above procedure 03/27/2020 with Dr. Jeral Pinch.   Pt last seen by cardiology 08/10/2019. Stable at this visit.  Normal coronary CTA 10/11/2015.  1 year follow up recommended.   Pt is concerned that she may have a difficult airway because she sometimes feels like her "throat gets tight".  She associates this with acid reflux.  Notices improvement when she takes protonix, does not take regularly.  Advised by PAT nurse to resume.  She reports having an endoscopy last year which may have showed some scarring from chronic GERD, but she is unsure (I have requested notes from endoscopy from [facility]).  She reports last surgery as a child.  No known h/o difficult airway.    VS: BP 113/68   Pulse 79   Resp 16   Ht 5\' 4"  (1.626 m)   Wt 63.9 kg   BMI 24.17 kg/m   PROVIDERS: [name], MD is PCP    LABS: Labs reviewed: Acceptable for surgery. (all labs ordered are listed, but only abnormal results are displayed)  Labs Reviewed  URINALYSIS, ROUTINE W REFLEX MICROSCOPIC - Abnormal; Notable for the following components:      Result Value   Color, Urine STRAW (*)    Hgb urine dipstick SMALL (*)    Leukocytes,Ua TRACE (*)    Bacteria, UA RARE (*)    All other components within normal limits  CBC  COMPREHENSIVE METABOLIC PANEL  TYPE AND SCREEN     IMAGES:   EKG: 08/10/2019 Rate 85 bpm  Normal sinus rhythm  Rightward axis ST & T wave abnormality,  consider inferior ischemia ST & T wave abnormality, consider anterolateral ischemia  CV: Echo 09/19/2015 Study Conclusions   - Left ventricle: The cavity size was normal. Wall thickness was  normal. Systolic function was normal. The estimated ejection  fraction was in the range of 55% to 60%. Wall motion was normal;  there were no regional wall motion abnormalities.  Past Medical History:  Diagnosis Date  . Abnormal EKG 13-Aug-2015  . Anxiety and depression    pt denies 7/16/  . Bruises easily   . Chest pain   . Chest pain with moderate risk of acute coronary syndrome   . Chest tightness   . Difficult intubation    throat feels small pt has acid reflux ? scars due to acid   . Elevated cholesterol   . Endometrial cancer ([place])   . Family history of premature CAD 08/13/2015   Father had MI 69's, died at 12 of an MI   . Fatigue   . Fibroid   . Fx sacrum/coccyx-closed ([place])   . Gastritis   . GERD (gastroesophageal reflux disease)   . Hyperlipidemia August 13, 2015  . Osteopenia   . Pigmented skin lesions   . Pre-diabetes   . Sinus problem   . SOB (shortness of breath) on exertion   . Urethra disorder    currently swollen per pt   .  Vitamin D deficiency   . Weight gain     Past Surgical History:  Procedure Laterality Date  . ADENOIDECTOMY    . COLONOSCOPY    . SMALL BOWEL ENTEROSCOPY    . TONSILLECTOMY      MEDICATIONS: . Calcium Carb-Cholecalciferol (CALCIUM-VITAMIN D3) 600-400 MG-UNIT TABS  . CALCIUM PO  . Cholecalciferol (VITAMIN D) 125 MCG (5000 UT) CAPS  . ibuprofen (ADVIL) 800 MG tablet  . Omega-3 Fatty Acids (FISH OIL PO)  . oxyCODONE (OXY IR/ROXICODONE) 5 MG immediate release tablet  . pantoprazole (PROTONIX) 40 MG tablet  . senna-docusate (SENOKOT-S) 8.6-50 MG tablet  . VITAMIN E PO   No current facility-administered medications for this encounter.     Maia Plan [facility] Pre-Surgical Testing [phone] 03/26/20  3:18 PM

## 2020-03-27 ENCOUNTER — Other Ambulatory Visit: Payer: Self-pay

## 2020-03-27 ENCOUNTER — Encounter (HOSPITAL_COMMUNITY)
Admission: RE | Disposition: A | Discharge status: Home / Self Care | Payer: Self-pay | Source: Home / Self Care | Attending: Gynecologic Oncology

## 2020-03-27 ENCOUNTER — Encounter (HOSPITAL_COMMUNITY): Payer: Self-pay | Admitting: Gynecologic Oncology

## 2020-03-27 ENCOUNTER — Ambulatory Visit (HOSPITAL_COMMUNITY)
Admission: RE | Admit: 2020-03-27 | Discharge: 2020-03-27 | Disposition: A | Discharge status: Home / Self Care | Payer: 59 | Attending: Gynecologic Oncology | Admitting: Gynecologic Oncology

## 2020-03-27 ENCOUNTER — Ambulatory Visit (HOSPITAL_COMMUNITY): Payer: 59 | Admitting: Physician Assistant

## 2020-03-27 ENCOUNTER — Ambulatory Visit (HOSPITAL_COMMUNITY): Payer: 59 | Admitting: Certified Registered Nurse Anesthetist

## 2020-03-27 DIAGNOSIS — Z791 Long term (current) use of non-steroidal anti-inflammatories (NSAID): Secondary | ICD-10-CM | POA: Insufficient documentation

## 2020-03-27 DIAGNOSIS — Z8719 Personal history of other diseases of the digestive system: Secondary | ICD-10-CM | POA: Insufficient documentation

## 2020-03-27 DIAGNOSIS — D280 Benign neoplasm of vulva: Secondary | ICD-10-CM | POA: Insufficient documentation

## 2020-03-27 DIAGNOSIS — D259 Leiomyoma of uterus, unspecified: Secondary | ICD-10-CM | POA: Diagnosis not present

## 2020-03-27 DIAGNOSIS — D2271 Melanocytic nevi of right lower limb, including hip: Secondary | ICD-10-CM | POA: Diagnosis not present

## 2020-03-27 DIAGNOSIS — Z8249 Family history of ischemic heart disease and other diseases of the circulatory system: Secondary | ICD-10-CM | POA: Insufficient documentation

## 2020-03-27 DIAGNOSIS — Z823 Family history of stroke: Secondary | ICD-10-CM | POA: Diagnosis not present

## 2020-03-27 DIAGNOSIS — Z833 Family history of diabetes mellitus: Secondary | ICD-10-CM | POA: Insufficient documentation

## 2020-03-27 DIAGNOSIS — C5702 Malignant neoplasm of left fallopian tube: Secondary | ICD-10-CM | POA: Insufficient documentation

## 2020-03-27 DIAGNOSIS — Z825 Family history of asthma and other chronic lower respiratory diseases: Secondary | ICD-10-CM | POA: Insufficient documentation

## 2020-03-27 DIAGNOSIS — F329 Major depressive disorder, single episode, unspecified: Secondary | ICD-10-CM | POA: Insufficient documentation

## 2020-03-27 DIAGNOSIS — N736 Female pelvic peritoneal adhesions (postinfective): Secondary | ICD-10-CM | POA: Diagnosis not present

## 2020-03-27 DIAGNOSIS — N950 Postmenopausal bleeding: Secondary | ICD-10-CM | POA: Diagnosis not present

## 2020-03-27 DIAGNOSIS — E785 Hyperlipidemia, unspecified: Secondary | ICD-10-CM | POA: Insufficient documentation

## 2020-03-27 DIAGNOSIS — Z8261 Family history of arthritis: Secondary | ICD-10-CM | POA: Diagnosis not present

## 2020-03-27 DIAGNOSIS — Z800 Family history of malignant neoplasm of digestive organs: Secondary | ICD-10-CM | POA: Diagnosis not present

## 2020-03-27 DIAGNOSIS — F418 Other specified anxiety disorders: Secondary | ICD-10-CM | POA: Diagnosis not present

## 2020-03-27 DIAGNOSIS — K219 Gastro-esophageal reflux disease without esophagitis: Secondary | ICD-10-CM | POA: Diagnosis not present

## 2020-03-27 DIAGNOSIS — K668 Other specified disorders of peritoneum: Secondary | ICD-10-CM | POA: Diagnosis not present

## 2020-03-27 DIAGNOSIS — N803 Endometriosis of pelvic peritoneum: Secondary | ICD-10-CM | POA: Diagnosis not present

## 2020-03-27 DIAGNOSIS — E7800 Pure hypercholesterolemia, unspecified: Secondary | ICD-10-CM | POA: Insufficient documentation

## 2020-03-27 DIAGNOSIS — Z888 Allergy status to other drugs, medicaments and biological substances status: Secondary | ICD-10-CM | POA: Insufficient documentation

## 2020-03-27 DIAGNOSIS — C541 Malignant neoplasm of endometrium: Secondary | ICD-10-CM | POA: Insufficient documentation

## 2020-03-27 DIAGNOSIS — Z79899 Other long term (current) drug therapy: Secondary | ICD-10-CM | POA: Insufficient documentation

## 2020-03-27 DIAGNOSIS — F419 Anxiety disorder, unspecified: Secondary | ICD-10-CM | POA: Diagnosis not present

## 2020-03-27 DIAGNOSIS — Z91040 Latex allergy status: Secondary | ICD-10-CM | POA: Insufficient documentation

## 2020-03-27 DIAGNOSIS — N898 Other specified noninflammatory disorders of vagina: Secondary | ICD-10-CM | POA: Diagnosis not present

## 2020-03-27 DIAGNOSIS — E559 Vitamin D deficiency, unspecified: Secondary | ICD-10-CM | POA: Diagnosis not present

## 2020-03-27 DIAGNOSIS — M8580 Other specified disorders of bone density and structure, unspecified site: Secondary | ICD-10-CM | POA: Insufficient documentation

## 2020-03-27 DIAGNOSIS — K658 Other peritonitis: Secondary | ICD-10-CM | POA: Diagnosis not present

## 2020-03-27 DIAGNOSIS — D251 Intramural leiomyoma of uterus: Secondary | ICD-10-CM | POA: Diagnosis not present

## 2020-03-27 HISTORY — PX: ROBOTIC ASSISTED TOTAL HYSTERECTOMY WITH BILATERAL SALPINGO OOPHERECTOMY: SHX6086

## 2020-03-27 HISTORY — PX: SENTINEL NODE BIOPSY: SHX6608

## 2020-03-27 LAB — TYPE AND SCREEN
ABO/RH(D): A POS
Antibody Screen: NEGATIVE

## 2020-03-27 LAB — ABO/RH: ABO/RH(D): A POS

## 2020-03-27 SURGERY — HYSTERECTOMY, TOTAL, ROBOT-ASSISTED, LAPAROSCOPIC, WITH BILATERAL SALPINGO-OOPHORECTOMY
Anesthesia: General

## 2020-03-27 MED ORDER — STERILE WATER FOR INJECTION IJ SOLN
INTRAMUSCULAR | Status: DC | PRN
  Administered 2020-03-27: 10 mL

## 2020-03-27 MED ORDER — KETAMINE HCL 10 MG/ML IJ SOLN
INTRAMUSCULAR | Status: DC | PRN
  Administered 2020-03-27: 30 mg via INTRAVENOUS

## 2020-03-27 MED ORDER — ROCURONIUM BROMIDE 10 MG/ML (PF) SYRINGE
PREFILLED_SYRINGE | INTRAVENOUS | Status: DC | PRN
  Administered 2020-03-27: 60 mg via INTRAVENOUS

## 2020-03-27 MED ORDER — HYDROMORPHONE HCL 1 MG/ML IJ SOLN
0.2500 mg | INTRAMUSCULAR | Status: DC | PRN
  Administered 2020-03-27: 0.5 mg via INTRAVENOUS

## 2020-03-27 MED ORDER — FENTANYL CITRATE (PF) 250 MCG/5ML IJ SOLN
INTRAMUSCULAR | Status: AC
  Filled 2020-03-27: qty 5

## 2020-03-27 MED ORDER — CHLORHEXIDINE GLUCONATE 0.12 % MT SOLN
15.0000 mL | Freq: Once | OROMUCOSAL | Status: AC
  Administered 2020-03-27: 15 mL via OROMUCOSAL

## 2020-03-27 MED ORDER — ONDANSETRON HCL 4 MG/2ML IJ SOLN
INTRAMUSCULAR | Status: AC
  Filled 2020-03-27: qty 2

## 2020-03-27 MED ORDER — GLYCOPYRROLATE PF 0.2 MG/ML IJ SOSY
PREFILLED_SYRINGE | INTRAMUSCULAR | Status: DC | PRN
  Administered 2020-03-27: .2 mg via INTRAVENOUS

## 2020-03-27 MED ORDER — LACTATED RINGERS IV SOLN: INTRAVENOUS | Status: DC

## 2020-03-27 MED ORDER — CEFAZOLIN SODIUM-DEXTROSE 2-4 GM/100ML-% IV SOLN
INTRAVENOUS | Status: AC
  Filled 2020-03-27: qty 100

## 2020-03-27 MED ORDER — LIDOCAINE 2% (20 MG/ML) 5 ML SYRINGE
INTRAMUSCULAR | Status: DC | PRN
Start: 2020-03-27 — End: 2020-03-27
  Administered 2020-03-27: 1.5 mg/kg/h via INTRAVENOUS

## 2020-03-27 MED ORDER — ONDANSETRON HCL 4 MG/2ML IJ SOLN
INTRAMUSCULAR | Status: DC | PRN
  Administered 2020-03-27: 4 mg via INTRAVENOUS

## 2020-03-27 MED ORDER — DEXAMETHASONE SODIUM PHOSPHATE 10 MG/ML IJ SOLN
INTRAMUSCULAR | Status: DC | PRN
  Administered 2020-03-27: 4 mg via INTRAVENOUS

## 2020-03-27 MED ORDER — OXYCODONE HCL 5 MG/5ML PO SOLN: 5.0000 mg | Freq: Once | ORAL | Status: DC | PRN

## 2020-03-27 MED ORDER — BUPIVACAINE HCL 0.25 % IJ SOLN
INTRAMUSCULAR | Status: AC
  Filled 2020-03-27: qty 1

## 2020-03-27 MED ORDER — SCOPOLAMINE 1 MG/3DAYS TD PT72: 1.0000 | MEDICATED_PATCH | TRANSDERMAL | Status: DC

## 2020-03-27 MED ORDER — SUGAMMADEX SODIUM 200 MG/2ML IV SOLN
INTRAVENOUS | Status: DC | PRN
  Administered 2020-03-27: 200 mg via INTRAVENOUS

## 2020-03-27 MED ORDER — PROMETHAZINE HCL 25 MG/ML IJ SOLN
6.2500 mg | INTRAMUSCULAR | Status: DC | PRN
  Administered 2020-03-27: 6.25 mg via INTRAVENOUS

## 2020-03-27 MED ORDER — PROPOFOL 10 MG/ML IV BOLUS
INTRAVENOUS | Status: AC
  Filled 2020-03-27: qty 20

## 2020-03-27 MED ORDER — SCOPOLAMINE 1 MG/3DAYS TD PT72
1.0000 | MEDICATED_PATCH | TRANSDERMAL | Status: DC
  Administered 2020-03-27: 1.5 mg via TRANSDERMAL
  Filled 2020-03-27: qty 1

## 2020-03-27 MED ORDER — EPHEDRINE SULFATE-NACL 50-0.9 MG/10ML-% IV SOSY
PREFILLED_SYRINGE | INTRAVENOUS | Status: DC | PRN
  Administered 2020-03-27: 5 mg via INTRAVENOUS
  Administered 2020-03-27: 10 mg via INTRAVENOUS

## 2020-03-27 MED ORDER — ACETAMINOPHEN 500 MG PO TABS: 1000.0000 mg | ORAL_TABLET | Freq: Once | ORAL | Status: DC

## 2020-03-27 MED ORDER — LIDOCAINE 2% (20 MG/ML) 5 ML SYRINGE
INTRAMUSCULAR | Status: AC
  Filled 2020-03-27: qty 5

## 2020-03-27 MED ORDER — STERILE WATER FOR INJECTION IJ SOLN
INTRAMUSCULAR | Status: AC
  Filled 2020-03-27: qty 10

## 2020-03-27 MED ORDER — ACETAMINOPHEN 500 MG PO TABS
1000.0000 mg | ORAL_TABLET | ORAL | Status: AC
  Administered 2020-03-27: 1000 mg via ORAL
  Filled 2020-03-27: qty 2

## 2020-03-27 MED ORDER — PHENYLEPHRINE 40 MCG/ML (10ML) SYRINGE FOR IV PUSH (FOR BLOOD PRESSURE SUPPORT)
PREFILLED_SYRINGE | INTRAVENOUS | Status: DC | PRN
  Administered 2020-03-27: 40 ug via INTRAVENOUS

## 2020-03-27 MED ORDER — PROMETHAZINE HCL 25 MG/ML IJ SOLN
INTRAMUSCULAR | Status: AC
  Filled 2020-03-27: qty 1

## 2020-03-27 MED ORDER — LIDOCAINE 2% (20 MG/ML) 5 ML SYRINGE
INTRAMUSCULAR | Status: DC | PRN
  Administered 2020-03-27: 60 mg via INTRAVENOUS

## 2020-03-27 MED ORDER — ORAL CARE MOUTH RINSE: 15.0000 mL | Freq: Once | OROMUCOSAL | Status: AC

## 2020-03-27 MED ORDER — CELECOXIB 200 MG PO CAPS
400.0000 mg | ORAL_CAPSULE | ORAL | Status: AC
  Administered 2020-03-27: 400 mg via ORAL
  Filled 2020-03-27: qty 2

## 2020-03-27 MED ORDER — BUPIVACAINE HCL 0.25 % IJ SOLN
INTRAMUSCULAR | Status: DC | PRN
  Administered 2020-03-27: 30 mL

## 2020-03-27 MED ORDER — STERILE WATER FOR IRRIGATION IR SOLN
Status: DC | PRN
  Administered 2020-03-27: 1000 mL

## 2020-03-27 MED ORDER — LACTATED RINGERS IR SOLN
Status: DC | PRN
  Administered 2020-03-27: 1000 mL

## 2020-03-27 MED ORDER — SUCCINYLCHOLINE CHLORIDE 200 MG/10ML IV SOSY
PREFILLED_SYRINGE | INTRAVENOUS | Status: AC
  Filled 2020-03-27: qty 10

## 2020-03-27 MED ORDER — LACTATED RINGERS IV SOLN
INTRAVENOUS | Status: DC | PRN
Start: 2020-03-27 — End: 2020-03-27

## 2020-03-27 MED ORDER — ROCURONIUM BROMIDE 10 MG/ML (PF) SYRINGE
PREFILLED_SYRINGE | INTRAVENOUS | Status: AC
  Filled 2020-03-27: qty 10

## 2020-03-27 MED ORDER — HYDROMORPHONE HCL 1 MG/ML IJ SOLN
INTRAMUSCULAR | Status: AC
  Administered 2020-03-27: 0.5 mg via INTRAVENOUS
  Filled 2020-03-27: qty 1

## 2020-03-27 MED ORDER — SUCCINYLCHOLINE CHLORIDE 200 MG/10ML IV SOSY
PREFILLED_SYRINGE | INTRAVENOUS | Status: DC | PRN
  Administered 2020-03-27: 70 mg via INTRAVENOUS

## 2020-03-27 MED ORDER — GABAPENTIN 300 MG PO CAPS
300.0000 mg | ORAL_CAPSULE | ORAL | Status: AC
  Administered 2020-03-27: 300 mg via ORAL
  Filled 2020-03-27: qty 1

## 2020-03-27 MED ORDER — MEPERIDINE HCL 50 MG/ML IJ SOLN: 6.2500 mg | INTRAMUSCULAR | Status: DC | PRN

## 2020-03-27 MED ORDER — MIDAZOLAM HCL 2 MG/2ML IJ SOLN
INTRAMUSCULAR | Status: AC
  Filled 2020-03-27: qty 2

## 2020-03-27 MED ORDER — OXYCODONE HCL 5 MG PO TABS: 5.0000 mg | ORAL_TABLET | Freq: Once | ORAL | Status: DC | PRN

## 2020-03-27 MED ORDER — PROPOFOL 10 MG/ML IV BOLUS
INTRAVENOUS | Status: DC | PRN
  Administered 2020-03-27: 130 mg via INTRAVENOUS

## 2020-03-27 MED ORDER — HEPARIN SODIUM (PORCINE) 5000 UNIT/ML IJ SOLN
5000.0000 [IU] | INTRAMUSCULAR | Status: AC
  Administered 2020-03-27: 5000 [IU] via SUBCUTANEOUS
  Filled 2020-03-27: qty 1

## 2020-03-27 MED ORDER — CEFAZOLIN SODIUM-DEXTROSE 2-4 GM/100ML-% IV SOLN
2.0000 g | INTRAVENOUS | Status: AC
  Administered 2020-03-27: 2 g via INTRAVENOUS
  Filled 2020-03-27: qty 100

## 2020-03-27 MED ORDER — KETOROLAC TROMETHAMINE 30 MG/ML IJ SOLN
INTRAMUSCULAR | Status: AC
  Filled 2020-03-27: qty 1

## 2020-03-27 MED ORDER — FENTANYL CITRATE (PF) 100 MCG/2ML IJ SOLN
INTRAMUSCULAR | Status: DC | PRN
  Administered 2020-03-27 (×4): 50 ug via INTRAVENOUS

## 2020-03-27 MED ORDER — PHENYLEPHRINE HCL (PRESSORS) 10 MG/ML IV SOLN
INTRAVENOUS | Status: AC
  Filled 2020-03-27: qty 1

## 2020-03-27 MED ORDER — DEXAMETHASONE SODIUM PHOSPHATE 4 MG/ML IJ SOLN: 4.0000 mg | INTRAMUSCULAR | Status: DC

## 2020-03-27 MED ORDER — MIDAZOLAM HCL 5 MG/5ML IJ SOLN
INTRAMUSCULAR | Status: DC | PRN
  Administered 2020-03-27: 2 mg via INTRAVENOUS

## 2020-03-27 MED ORDER — KETOROLAC TROMETHAMINE 30 MG/ML IJ SOLN: 30.0000 mg | Freq: Once | INTRAMUSCULAR | Status: DC | PRN

## 2020-03-27 SURGICAL SUPPLY — 64 items
APPLICATOR SURGIFLO ENDO (HEMOSTASIS) IMPLANT
BACTOSHIELD CHG 4% 4OZ (MISCELLANEOUS) ×1
BAG LAPAROSCOPIC 12 15 PORT 16 (BASKET) IMPLANT
BAG RETRIEVAL 12/15 (BASKET)
BLADE SURG SZ10 CARB STEEL (BLADE) IMPLANT
COVER BACK TABLE 60X90IN (DRAPES) ×3 IMPLANT
COVER TIP SHEARS 8 DVNC (MISCELLANEOUS) ×2 IMPLANT
COVER TIP SHEARS 8MM DA VINCI (MISCELLANEOUS) ×3
COVER WAND RF STERILE (DRAPES) IMPLANT
DECANTER SPIKE VIAL GLASS SM (MISCELLANEOUS) IMPLANT
DERMABOND ADVANCED (GAUZE/BANDAGES/DRESSINGS) ×1
DERMABOND ADVANCED .7 DNX12 (GAUZE/BANDAGES/DRESSINGS) ×2 IMPLANT
DRAPE ARM DVNC X/XI (DISPOSABLE) ×8 IMPLANT
DRAPE COLUMN DVNC XI (DISPOSABLE) ×2 IMPLANT
DRAPE DA VINCI XI ARM (DISPOSABLE) ×12
DRAPE DA VINCI XI COLUMN (DISPOSABLE) ×3
DRAPE SHEET LG 3/4 BI-LAMINATE (DRAPES) ×3 IMPLANT
DRAPE SURG IRRIG POUCH 19X23 (DRAPES) ×3 IMPLANT
DRSG OPSITE POSTOP 4X6 (GAUZE/BANDAGES/DRESSINGS) IMPLANT
DRSG OPSITE POSTOP 4X8 (GAUZE/BANDAGES/DRESSINGS) IMPLANT
ELECT REM PT RETURN 15FT ADLT (MISCELLANEOUS) ×3 IMPLANT
GLOVE BIO SURGEON STRL SZ 6 (GLOVE) ×12 IMPLANT
GLOVE BIO SURGEON STRL SZ 6.5 (GLOVE) ×6 IMPLANT
GOWN STRL REUS W/ TWL LRG LVL3 (GOWN DISPOSABLE) ×8 IMPLANT
GOWN STRL REUS W/TWL LRG LVL3 (GOWN DISPOSABLE) ×12
HOLDER FOLEY CATH W/STRAP (MISCELLANEOUS) ×3 IMPLANT
IRRIG SUCT STRYKERFLOW 2 WTIP (MISCELLANEOUS) ×3
IRRIGATION SUCT STRKRFLW 2 WTP (MISCELLANEOUS) ×2 IMPLANT
KIT PROCEDURE DA VINCI SI (MISCELLANEOUS)
KIT PROCEDURE DVNC SI (MISCELLANEOUS) IMPLANT
KIT TURNOVER KIT A (KITS) IMPLANT
MANIPULATOR UTERINE 4.5 ZUMI (MISCELLANEOUS) ×3 IMPLANT
NEEDLE HYPO 21X1.5 SAFETY (NEEDLE) ×3 IMPLANT
NEEDLE SPNL 18GX3.5 QUINCKE PK (NEEDLE) IMPLANT
OBTURATOR OPTICAL STANDARD 8MM (TROCAR) ×3
OBTURATOR OPTICAL STND 8 DVNC (TROCAR) ×2
OBTURATOR OPTICALSTD 8 DVNC (TROCAR) ×2 IMPLANT
PACK ROBOT GYN CUSTOM WL (TRAY / TRAY PROCEDURE) ×3 IMPLANT
PAD POSITIONING PINK XL (MISCELLANEOUS) ×3 IMPLANT
PENCIL SMOKE EVACUATOR (MISCELLANEOUS) IMPLANT
PORT ACCESS TROCAR AIRSEAL 12 (TROCAR) ×2 IMPLANT
PORT ACCESS TROCAR AIRSEAL 5M (TROCAR) ×1
POUCH SPECIMEN RETRIEVAL 10MM (ENDOMECHANICALS) IMPLANT
SCRUB CHG 4% DYNA-HEX 4OZ (MISCELLANEOUS) ×2 IMPLANT
SEAL CANN UNIV 5-8 DVNC XI (MISCELLANEOUS) ×6 IMPLANT
SEAL XI 5MM-8MM UNIVERSAL (MISCELLANEOUS) ×9
SET TRI-LUMEN FLTR TB AIRSEAL (TUBING) ×3 IMPLANT
SPONGE LAP 18X18 RF (DISPOSABLE) IMPLANT
SURGIFLO W/THROMBIN 8M KIT (HEMOSTASIS) IMPLANT
SUT MNCRL AB 4-0 PS2 18 (SUTURE) IMPLANT
SUT PDS AB 1 TP1 96 (SUTURE) IMPLANT
SUT VIC AB 0 CT1 27 (SUTURE)
SUT VIC AB 0 CT1 27XBRD ANTBC (SUTURE) IMPLANT
SUT VIC AB 2-0 CT1 27 (SUTURE)
SUT VIC AB 2-0 CT1 TAPERPNT 27 (SUTURE) IMPLANT
SUT VICRYL 4-0 PS2 18IN ABS (SUTURE) ×6 IMPLANT
SYR 10ML LL (SYRINGE) IMPLANT
TOWEL OR NON WOVEN STRL DISP B (DISPOSABLE) ×3 IMPLANT
TRAP SPECIMEN MUCUS 40CC (MISCELLANEOUS) IMPLANT
TRAY FOLEY MTR SLVR 16FR STAT (SET/KITS/TRAYS/PACK) ×3 IMPLANT
TROCAR XCEL NON-BLD 5MMX100MML (ENDOMECHANICALS) IMPLANT
UNDERPAD 30X36 HEAVY ABSORB (UNDERPADS AND DIAPERS) ×3 IMPLANT
WATER STERILE IRR 1000ML POUR (IV SOLUTION) ×3 IMPLANT
YANKAUER SUCT BULB TIP 10FT TU (MISCELLANEOUS) IMPLANT

## 2020-03-27 NOTE — Progress Notes (Signed)
Spoke with Dr. Berline Lopes via telephone, patient does not need to void before discharge.

## 2020-03-27 NOTE — Discharge Instructions (Addendum)
03/27/2020  Return to work: 4-6 weeks if applicable  Use the peri bottle when urinating and after toileting and pat the vulva dry. A biopsy was taken of the skin lesion you reported on your mons pubis.  A biopsy was taken of the urethra and visually it appeared normal.  You have dissolvable stitches at the entrance of the vagina.   Activity: 1. Be up and out of the bed during the day.  Take a nap if needed.  You may walk up steps but be careful and use the hand rail.  Stair climbing will tire you more than you think, you may need to stop part way and rest.   2. No lifting or straining for 6 weeks.  3. No driving for 1 week(s).  Do not drive if you are taking narcotic pain medicine.  4. Shower daily.  Use soap and water on your incision and pat dry; don't rub.  No tub baths until cleared by your surgeon.   5. No sexual activity and nothing in the vagina for 8 weeks.  6. You may experience a small amount of clear drainage from your incisions, which is normal.  If the drainage persists or increases, please call the office.  7. You may experience vaginal spotting after surgery or around the 6-8 week mark from surgery when the stitches at the top of the vagina begin to dissolve.  The spotting is normal but if you experience heavy bleeding, call our office.  8. Take Tylenol or ibuprofen first for pain and only use narcotic pain medication for severe pain not relieved by the Tylenol or Ibuprofen.  Monitor your Tylenol intake to a max of 4,000 mg.  Diet: 1. Low sodium Heart Healthy Diet is recommended.  2. It is safe to use a laxative, such as Miralax or Colace, if you have difficulty moving your bowels. You can take Sennakot at bedtime every evening to keep bowel movements regular and to prevent constipation.    Wound Care: 1. Keep clean and dry.  Shower daily.  Reasons to call the Doctor:  Fever - Oral temperature greater than 100.4 degrees Fahrenheit  Foul-smelling vaginal  discharge  Difficulty urinating  Nausea and vomiting  Increased pain at the site of the incision that is unrelieved with pain medicine.  Difficulty breathing with or without chest pain  New calf pain especially if only on one side  Sudden, continuing increased vaginal bleeding with or without clots.   Contacts: For questions or concerns you should contact:  Dr. Jeral Pinch at [phone]  Joylene John, NP at [phone]  After Hours: call [phone] and have the GYN Oncologist paged/contacted

## 2020-03-27 NOTE — Transfer of Care (Signed)
Immediate Anesthesia Transfer of Care Note  Patient: Emily [name]  Procedure(s) Performed: XI ROBOTIC ASSISTED TOTAL HYSTERECTOMY WITH BILATERAL SALPINGO OOPHORECTOMY (Bilateral ) SENTINEL NODE BIOPSY, URETHRAL AND VAGINAL BIOPSY (N/A )  Patient Location: PACU  Anesthesia Type:General  Level of Consciousness: drowsy  Airway & Oxygen Therapy: Patient Spontanous Breathing and Patient connected to face mask oxygen  Post-op Assessment: Report given to RN and Post -op Vital signs reviewed and stable  Post vital signs: Reviewed and stable  Last Vitals:  Vitals Value Taken Time  BP 133/80 03/27/20 1431  Temp    Pulse 77 03/27/20 1434  Resp 17 03/27/20 1434  SpO2 100 % 03/27/20 1434  Vitals shown include unvalidated device data.  Last Pain:  Vitals:   03/27/20 0931  TempSrc: Oral  PainSc:          Complications: No complications documented.

## 2020-03-27 NOTE — Anesthesia Procedure Notes (Signed)
Procedure Name: Intubation Date/Time: 03/27/2020 12:03 PM Performed by: Montel Clock, CRNA Pre-anesthesia Checklist: Patient identified, Emergency Drugs available, Suction available, Patient being monitored and Timeout performed Patient Re-evaluated:Patient Re-evaluated prior to induction Oxygen Delivery Method: Circle system utilized Preoxygenation: Pre-oxygenation with 100% oxygen Induction Type: IV induction and Rapid sequence Laryngoscope Size: 3 and Glidescope Grade View: Grade I Tube type: Oral Tube size: 6.5 mm Number of attempts: 1 Airway Equipment and Method: Rigid stylet and Video-laryngoscopy Placement Confirmation: ETT inserted through vocal cords under direct vision,  positive ETCO2 and breath sounds checked- equal and bilateral Secured at: 21 cm Tube secured with: Tape Dental Injury: Teeth and Oropharynx as per pre-operative assessment  Comments: Patient was electively glidescoped r/t patients concerns about difficult airway (scar tissue, tight airway, reflux, sore throat).  Nothing abnormal noted during glidescope.

## 2020-03-27 NOTE — Anesthesia Postprocedure Evaluation (Signed)
Anesthesia Post Note  Patient: KARLEIGH BUNTE  Procedure(s) Performed: XI ROBOTIC ASSISTED TOTAL HYSTERECTOMY WITH BILATERAL SALPINGO OOPHORECTOMY (Bilateral ) SENTINEL NODE BIOPSY, URETHRAL BIOPSY AND VAGINAL CULTURE (N/A )     Patient location during evaluation: PACU Anesthesia Type: General Level of consciousness: awake and alert Pain management: pain level controlled Vital Signs Assessment: post-procedure vital signs reviewed and stable Respiratory status: spontaneous breathing, nonlabored ventilation, respiratory function stable and patient connected to nasal cannula oxygen Cardiovascular status: blood pressure returned to baseline and stable Postop Assessment: no apparent nausea or vomiting Anesthetic complications: no   No complications documented.  Last Vitals:  Vitals:   03/27/20 1515 03/27/20 1559  BP: 132/76 140/79  Pulse: 71 66  Resp: 18 18  Temp:    SpO2: 97% 99%    Last Pain:  Vitals:   03/27/20 1515  TempSrc:   PainSc: 6                  Everlee Quakenbush

## 2020-03-27 NOTE — Anesthesia Preprocedure Evaluation (Addendum)
Anesthesia Evaluation  Patient identified by MRN, date of birth, ID band Patient awake    Reviewed: Allergy & Precautions, NPO status , Patient's Chart, lab work & pertinent test results  Airway Mallampati: III  TM Distance: >3 FB Neck ROM: Full    Dental no notable dental hx. (+) Teeth Intact, Dental Advisory Given   Pulmonary neg pulmonary ROS,    Pulmonary exam normal breath sounds clear to auscultation       Cardiovascular negative cardio ROS Normal cardiovascular exam Rhythm:Regular Rate:Normal     Neuro/Psych PSYCHIATRIC DISORDERS Anxiety Depression negative neurological ROS     GI/Hepatic Neg liver ROS, GERD  Medicated and Controlled,  Endo/Other  negative endocrine ROS  Renal/GU negative Renal ROS  Female GU complaint Endometrial ca     Musculoskeletal negative musculoskeletal ROS (+)   Abdominal Normal abdominal exam  (+)   Peds  Hematology negative hematology ROS (+)   Anesthesia Other Findings Had endoscopy in 2018  Reproductive/Obstetrics negative OB ROS                           Anesthesia Physical Anesthesia Plan  ASA: II  Anesthesia Plan: General   Post-op Pain Management:    Induction: Intravenous  PONV Risk Score and Plan: 4 or greater and Ondansetron, Dexamethasone, Midazolam, Scopolamine patch - Pre-op and Treatment may vary due to age or medical condition  Airway Management Planned: Oral ETT  Additional Equipment: None  Intra-op Plan:   Post-operative Plan: Extubation in OR  Informed Consent: I have reviewed the patients History and Physical, chart, labs and discussed the procedure including the risks, benefits and alternatives for the proposed anesthesia with the patient or authorized representative who has indicated his/her understanding and acceptance.     Dental advisory given  Plan Discussed with: CRNA  Anesthesia Plan Comments: (Per patient,  had endoscopy in 2018 with possible esophagitis but throat was extremely irritated afterwards and she coughed/could not catch her breath for a very long time afterwards. She states that she feels as though she has scarring/narrowing in the back of her throat after this endoscopy. Has never been labeled difficult airway by an anesthesia provider, but believes she might be one. )      Anesthesia Quick Evaluation

## 2020-03-27 NOTE — Op Note (Signed)
OPERATIVE NOTE  Pre-operative Diagnosis: endometrial cancer grade 1-2  Post-operative Diagnosis: same, endometriosis  Operation: Robotic-assisted laparoscopic total hysterectomy with bilateral salpingoophorectomy, SLN biopsy   Surgeon: Jeral Pinch MD  Assistant Surgeon: Joylene John NP  Anesthesia: GET  Urine Output: 150cc  Operative Findings: On EUA, very narrow introitus most due to tight hymenal ring. Small mobile uterus. Hyperpigmented 0.11mm plaque on right mons, biopsied. Urethral overall normal in appearance, given hyperemic appearance in clinic, biopsy taken. On intra-abdominal entry, some scarring noted on inferior aspect of the liver, anterior right diaphragm and left lobe of the liver. Omentum normal appearing. Mesentery of the small and large bowel as well as the bowel itself studded with 1-36mm inflammatory-appearing nodules. Appendix with same studding, otherwise normal in appearance. Uterus 6cm and normal appearing with the exception of inflammatory exudate on posterior aspect of the fundus. Bilateral adnexa normal appearing. Chocolate brown staining within most of the cul-de-sac. Inflammatory appearing nodules studding bilateral pelvic walls, anterior and posterior cul-de-sac c/w endometriosis. No adenopathy. No intra-abdominal or pelvic evidence of disease.   IOFS of the right anterior cul-de-sac showed inflammation, no malignancy  Estimated Blood Loss:  less than 100 mL      Total IV Fluids: see I&O flowsheet         Specimens: uterus, cervix, bilateral tubes and ovaries, bilateral external iliac sentinel lymph nodes, anterior cul-de-sac (frozen), right mons nodule biopsy, peri-urethral biopsy         Complications:  None apparent; patient tolerated the procedure well.         Disposition: PACU - hemodynamically stable.  Procedure Details  The patient was seen in the Holding Room. The risks, benefits, complications, treatment options, and expected outcomes were  discussed with the patient.  The patient concurred with the proposed plan, giving informed consent.  The site of surgery properly noted/marked. The patient was identified as Emily [name] and the procedure verified as a Robotic-assisted hysterectomy with bilateral salpingo oophorectomy with SLN biopsy.   After induction of anesthesia, the patient was draped and prepped in the usual sterile manner. Patient was placed in supine position after anesthesia and draped and prepped in the usual sterile manner as follows: Her arms were tucked to her side with all appropriate precautions.  The shoulders were stabilized with padded shoulder blocks applied to the acromium processes.  The patient was placed in the semi-lithotomy position in [place].  The perineum and vagina were prepped with CholoraPrep. The patient was draped after the CholoraPrep had been allowed to dry for 3 minutes.  A Time Out was held and the above information confirmed.  The urethra was prepped with Betadine. Foley catheter was placed.  The peri-urethral biopsy was performed. 10cc of 0.25% marcaine was injected into the perineum and an episiotomy was created (including hymenotomy). A sterile speculum was placed in the vagina.  The cervix was grasped with a single-tooth tenaculum. 2mg  total of ICG was injected into the cervical stroma at 2 and 9 o'clock with 1cc injected at a 1cm and 32mm depth (concentration 0.5mg /ml) in all locations. The cervix was dilated with Kennon Rounds dilators.  The ZUMI uterine manipulator with a small colpotomizer ring was placed without difficulty.  A pneum occluder balloon was placed over the manipulator.  OG tube placement was confirmed and to suction.   Next, a 10 mm skin incision was made 1 cm below the subcostal margin in the midclavicular line.  The 5 mm Optiview port and scope was used for direct entry.  Opening pressure was under 10 mm CO2.  The abdomen was insufflated and the findings were noted as above.   At  this point and all points during the procedure, the patient's intra-abdominal pressure did not exceed 15 mmHg. Next, an 8 mm skin incision was made superior to the umbilicus and a right and left port were placed about 8 cm lateral to the robot port on the right and left side.  A fourth arm was placed on the right.  The 5 mm assist trocar was exchanged for a 10-12 mm port. All ports were placed under direct visualization.  The patient was placed in steep Trendelenburg.  Bowel was folded away into the upper abdomen.  The robot was docked in the normal manner.  A section of anterior cul-de-sac peritoneum was excised with monopolar electrocautery and sent for frozen section.  The right and left peritoneum were opened parallel to the IP ligament to open the retroperitoneal spaces bilaterally. The round ligaments were transected. The SLN mapping was performed in bilateral pelvic basins. After identifying the ureters, the para rectal and paravesical spaces were opened up entirely with careful dissection below the level of the ureters bilaterally and to the depth of the uterine artery origin in order to skeletonize the uterine "web" and ensure visualization of all parametrial channels. The para-aortic basins were carefully exposed and evaluated for isolated para-aortic SLN's. Lymphatic channels were identified travelling to the following visualized sentinel lymph node's: bilateral external iliac SLNs. These SLN's were separated from their surrounding lymphatic tissue, removed and sent for permanent pathology.  The hysterectomy was started.  The ureter was again noted to be on the medial leaf of the broad ligament.  The peritoneum above the ureter was incised and stretched and the infundibulopelvic ligament was skeletonized, cauterized and cut.  The posterior peritoneum was taken down to the level of the KOH ring.  The anterior peritoneum was also taken down.  The bladder flap was created to the level of the KOH ring.   The uterine artery on the right side was skeletonized, cauterized and cut in the normal manner.  A similar procedure was performed on the left.  The colpotomy was made and the uterus, cervix, bilateral ovaries and tubes were amputated and delivered through the vagina.  Pedicles were inspected and excellent hemostasis was achieved.    The colpotomy at the vaginal cuff was closed with Vicryl on a CT1 needle in s running manner.  Irrigation was used and excellent hemostasis was achieved.  At this point in the procedure was completed.  Robotic instruments were removed under direct visulaization.  The robot was undocked. The fascia at the 10-12 mm port was closed with 0 Vicryl on a UR-5 needle.  The subcuticular tissue was closed with 4-0 Vicryl and the skin was closed with 4-0 Monocryl in a subcuticular manner.  Dermabond was applied.    The vagina was swabbed. Significant bleeding was noted from a posterior tear above the level of the hymen. A figure of eight using 2-0 Vicryl was used to achieved hemostasis. Given some bleeding from the hymen, a running stitch was used to reapproximate the hymen, with the vaginal introitus measuring two fingerbreadths.   The vagina was irrigated. All sponge, lap and needle counts were correct x  3. Foley catheter was removed.  The patient was transferred to the recovery room in stable condition.  Jeral Pinch, MD

## 2020-03-27 NOTE — Interval H&P Note (Signed)
History and Physical Interval Note:  03/27/2020 11:15 AM  Emily [name]  has presented today for surgery, with the diagnosis of ENDOMETRIAL CANCER.  The various methods of treatment have been discussed with the patient and family. After consideration of risks, benefits and other options for treatment, the patient has consented to  Procedure(s): XI ROBOTIC ASSISTED TOTAL HYSTERECTOMY WITH BILATERAL SALPINGO OOPHORECTOMY (Bilateral) SENTINEL NODE BIOPSY, POSSIBLE LYMPH NODE DISSECTION, POSSIBLE LAPAROTOMY URETHRAL AND VAGINAL BIOPSY (N/A) as a surgical intervention.  The patient's history has been reviewed, patient examined, no change in status, stable for surgery.  I have reviewed the patient's chart and labs.  Questions were answered to the patient's satisfaction.     [name]

## 2020-03-28 ENCOUNTER — Encounter (HOSPITAL_COMMUNITY): Payer: Self-pay | Admitting: Gynecologic Oncology

## 2020-03-28 ENCOUNTER — Telehealth: Payer: Self-pay

## 2020-03-28 LAB — URINE CULTURE: Culture: NO GROWTH

## 2020-03-28 NOTE — Telephone Encounter (Signed)
Emily [name] states that she is eating, drinking, and urinating well. Using the peri bottle to dilute the urine with perineal bx done. She is passing gas.  Will begin senokot-s 2 tabs bid.  If no BM by tomorrow afternoon, she is to add a capful of Miralax bid until good evacuation. Afebrile. Incisions are D&I. Pain controlled with current medications. She is experiencing bilateral shoulder pain under the bone from positioning. 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 Reviewed post op appointments  and office phone number [phone] to call if any questions or concerns.  Pt stated that she was very pleased with her surgical experience yesterday.

## 2020-03-30 ENCOUNTER — Encounter: Payer: Self-pay | Admitting: Gynecologic Oncology

## 2020-04-01 LAB — AEROBIC/ANAEROBIC CULTURE W GRAM STAIN (SURGICAL/DEEP WOUND)
Culture: NORMAL
Gram Stain: NONE SEEN

## 2020-04-01 NOTE — Progress Notes (Signed)
Gynecologic Oncology Telehealth Consult Note: Gyn-Onc  I connected with Emily [name] on 04/03/20 at  4:00 PM EDT by telephone and verified that I am speaking with the correct person using two identifiers.  I discussed the limitations, risks, security and privacy concerns of performing an evaluation and management service by telemedicine and the availability of in-person appointments. I also discussed with the patient that there may be a patient responsible charge related to this service. The patient expressed understanding and agreed to proceed.  Other persons participating in the visit and their role in the encounter: sisters.  Patient's location: [place], with her sisters Provider's location: [facility]  Reason for Visit: follow-up after surgery, review of pathology  Treatment History: Oncology History  Endometrial cancer ([place])  03/16/2020 Initial Diagnosis   Endometrial cancer ([place])   03/27/2020 Pathology Results   A. CUL DE SAC, ANTERIOR, BIOPSY:  -  Benign fibrovascular tissue with mesothelial hyperplasia  -  No carcinoma identified  -  See comment   B. MONS, RIGHT, BIOPSY:  -  Melanocytic nevus, intradermal type  -  See comment   C. LYMPH NODE, SENTINEL, RIGHT EXTERNAL ILIAC, BIOPSY:  -  No carcinoma identified in one lymph node (0/1)  -  See comment   D. PERITONEUM, LEFT SIDEWALL, BIOPSY:  -  Benign fibrovascular tissue with chronic inflammation  -  No carcinoma identified   E. LYMPH NODE, SENTINEL, LEFT EXTERNAL ILIAC, BIOPSY:  -  No carcinoma identified in one lymph node (0/1)  -  See comment   F. UTERUS, CERVIX, BILATERAL FALLOPIAN TUBES AND OVARIES:   Uterus:  -  Endometrioid carcinoma, FIGO grade 2  -  Leiomyomata (1.6 cm ; largest)  -  Carcinoma present in lymphoid aggregate in serosal adhesions  (lymphovascular space invasion)  -  See oncology table and comment below   Cervix:  -  No carcinoma identified   Bilateral Ovaries:  -  No carcinoma identified    Bilateral Fallopian tubes:  -  Carcinoma within lumen of fallopian tube  (left)   G. PERIURETHRAL, BIOPSY:  -  No carcinoma identified    03/27/2020 Surgery   Robotic-assisted laparoscopic total hysterectomy with bilateral salpingoophorectomy, SLN biopsy   On EUA, very narrow introitus most due to tight hymenal ring. Small mobile uterus. Hyperpigmented 0.1m plaque on right mons, biopsied. Urethral overall normal in appearance, given hyperemic appearance in clinic, biopsy taken. On intra-abdominal entry, some scarring noted on inferior aspect of the liver, anterior right diaphragm and left lobe of the liver. Omentum normal appearing. Mesentery of the small and large bowel as well as the bowel itself studded with 1-269minflammatory-appearing nodules. Appendix with same studding, otherwise normal in appearance. Uterus 6cm and normal appearing with the exception of inflammatory exudate on posterior aspect of the fundus. Bilateral adnexa normal appearing. Chocolate brown staining within most of the cul-de-sac. Inflammatory appearing nodules studding bilateral pelvic walls, anterior and posterior cul-de-sac c/w endometriosis. No adenopathy. No intra-abdominal or pelvic evidence of disease.   03/2020 Initial Biopsy   EMB - gr 1-2 EMCA     Interval History: The patient says she is healing well from surgery. [place], achy. She also continues to feel that her urethra is swollen.  Appetite is good without nausea or emesis.  She reports improving bowel function and is using Senokot.  She has had low-grade temperature up to 99.1 F.  She denies any significant vaginal discharge  or bleeding.  She had been taking ibuprofen regularly but stopped this last night.  She is now using Tylenol as needed.  Past Medical/Surgical History: Past Medical History:  Diagnosis Date  . Abnormal EKG 2015/08/09  . Anxiety and depression    pt denies  7/16/  . Bruises easily   . Chest pain   . Chest pain with moderate risk of acute coronary syndrome   . Chest tightness   . Difficult intubation    throat feels small pt has acid reflux ? scars due to acid   . Elevated cholesterol   . Endometrial cancer ([place])   . Family history of premature CAD 08/09/15   Father had MI 36's, died at 74 of an MI   . Fatigue   . Fibroid   . Fx sacrum/coccyx-closed ([place])   . Gastritis   . GERD (gastroesophageal reflux disease)   . Hyperlipidemia 08-09-2015  . Osteopenia   . Pigmented skin lesions   . Pre-diabetes   . Sinus problem   . SOB (shortness of breath) on exertion   . Urethra disorder    currently swollen per pt   . Vitamin D deficiency   . Weight gain     Past Surgical History:  Procedure Laterality Date  . ADENOIDECTOMY    . COLONOSCOPY    . ROBOTIC ASSISTED TOTAL HYSTERECTOMY WITH BILATERAL SALPINGO OOPHERECTOMY Bilateral 03/27/2020   Procedure: XI ROBOTIC ASSISTED TOTAL HYSTERECTOMY WITH BILATERAL SALPINGO OOPHORECTOMY;  Surgeon: [name], MD;  Location: [facility];  Service: Gynecology;  Laterality: Bilateral;  . SENTINEL NODE BIOPSY N/A 03/27/2020   Procedure: SENTINEL NODE BIOPSY, URETHRAL BIOPSY AND VAGINAL CULTURE;  Surgeon: [name], MD;  Location: [facility];  Service: Gynecology;  Laterality: N/A;  . SMALL BOWEL ENTEROSCOPY    . TONSILLECTOMY      Family History  Problem Relation Age of Onset  . Atrial fibrillation Mother   . Hypertension Mother   . Stroke Mother   . Arthritis Mother   . Atrial fibrillation Father   . Heart attack Father 53  . Hypertension Father   . COPD Father   . Diabetes Father   . Colon cancer Paternal Grandmother   . Breast cancer Neg Hx     Social History   Socioeconomic History  . Marital status: Single    Spouse name: Not on file  . Number of children: 0  . Years of education: 66  . Highest education level: Not on file  Occupational History  . Occupation: Therapist, sports   Tobacco Use  . Smoking status: Never Smoker  . Smokeless tobacco: Never Used  Vaping Use  . Vaping Use: Never used  Substance and Sexual Activity  . Alcohol use: Never  . Drug use: No  . Sexual activity: Not Currently  Other Topics Concern  . Not on file  Social History Narrative  . Not on file   Social Determinants of Health   Financial Resource Strain:   . Difficulty of Paying Living Expenses:   Food Insecurity:   . Worried About Charity fundraiser in the Last Year:   . Arboriculturist in the Last Year:   Transportation Needs:   . Film/video editor (Medical):   Marland Kitchen Lack of Transportation (Non-Medical):   Physical Activity:   . Days of Exercise per Week:   . Minutes of Exercise per Session:   Stress:   . Feeling of Stress :   Social Connections:   .  Frequency of Communication with Friends and Family:   . Frequency of Social Gatherings with Friends and Family:   . Attends Religious Services:   . Active Member of Clubs or Organizations:   . Attends Archivist Meetings:   Marland Kitchen Marital Status:     Current Medications:  Current Outpatient Medications:  .  Calcium Carb-Cholecalciferol (CALCIUM-VITAMIN D3) 600-400 MG-UNIT TABS, Take 1 tablet by mouth daily., Disp: , Rfl:  .  CALCIUM PO, Take by mouth. (Patient not taking: Reported on 03/16/2020), Disp: , Rfl:  .  Cholecalciferol (VITAMIN D) 125 MCG (5000 UT) CAPS, Take 5,000 Units by mouth daily., Disp: , Rfl:  .  ibuprofen (ADVIL) 800 MG tablet, Take 1 tablet (800 mg total) by mouth every 8 (eight) hours as needed for moderate pain. For AFTER surgery only, Disp: 30 tablet, Rfl: 0 .  Omega-3 Fatty Acids (FISH OIL PO), Take by mouth. (Patient not taking: Reported on 03/16/2020), Disp: , Rfl:  .  oxyCODONE (OXY IR/ROXICODONE) 5 MG immediate release tablet, Take 1 tablet (5 mg total) by mouth every 4 (four) hours as needed for severe pain. For AFTER surgery, do not take and drive, Disp: 10 tablet, Rfl: 0 .  pantoprazole  (PROTONIX) 40 MG tablet, Take 40 mg by mouth daily as needed (acid reduction). , Disp: , Rfl:  .  senna-docusate (SENOKOT-S) 8.6-50 MG tablet, Take 2 tablets by mouth at bedtime. For AFTER surgery, do not take if having diarrhea, Disp: 30 tablet, Rfl: 0 .  VITAMIN E PO, Take by mouth. (Patient not taking: Reported on 03/16/2020), Disp: , Rfl:   Review of Symptoms: Pertinent positives as per HPI.  Physical Exam: Deferred given limitations of phone visit.  Laboratory & Radiologic Studies: Pathology as noted in the above treatment section  Assessment & Plan: Emily [name] is a 59 y.o. 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  The patient is overall meeting postoperative milestones. She continues to have many of the symptoms that she did when we first met. As we had previously discussed, I think that these are unrelated to her cancer diagnosis. We discussed the finding of endometriosis during surgery, which may have contributed to some of her symptoms. I also suspect that she has atrophy causing some of her vaginal/urethral symptoms. Both her urine culture and vaginal swab were negative for any evidence of infection. She is waiting on a referral to urology.  We discussed in depth her pathology report. I answered her questions and her sister's questions. Discussed recommendations for adjuvant therapy with chemotherapy. I don't think that the patient would tolerate vaginal brachytherapy well - we will discuss this at tumor board, but I will likely recommend chemotherapy alone.  I discussed the assessment and treatment plan with the patient. The patient was provided with an opportunity to ask questions and all were answered. The patient agreed with the plan and demonstrated an understanding of the instructions.   The patient was advised to call back or see an in-person evaluation if the symptoms worsen  or if the condition fails to improve as anticipated.   25 minutes of total time was spent for this patient encounter, including preparation, face-to-face counseling with the patient and coordination of care, and documentation of the encounter.   Jeral Pinch, MD  Division of Gynecologic Oncology  Department of Obstetrics and Gynecology  [facility]

## 2020-04-02 ENCOUNTER — Telehealth: Payer: Self-pay | Admitting: Gynecologic Oncology

## 2020-04-02 NOTE — Telephone Encounter (Signed)
Patient is aware of phone visit on 04/03/2020

## 2020-04-03 ENCOUNTER — Encounter: Payer: Self-pay | Admitting: Gynecologic Oncology

## 2020-04-03 ENCOUNTER — Inpatient Hospital Stay (HOSPITAL_BASED_OUTPATIENT_CLINIC_OR_DEPARTMENT_OTHER): Payer: 59 | Admitting: Gynecologic Oncology

## 2020-04-03 DIAGNOSIS — Z90710 Acquired absence of both cervix and uterus: Secondary | ICD-10-CM

## 2020-04-03 DIAGNOSIS — C541 Malignant neoplasm of endometrium: Secondary | ICD-10-CM

## 2020-04-03 DIAGNOSIS — Z90722 Acquired absence of ovaries, bilateral: Secondary | ICD-10-CM

## 2020-04-06 LAB — SURGICAL PATHOLOGY

## 2020-04-09 ENCOUNTER — Telehealth: Payer: Self-pay

## 2020-04-09 ENCOUNTER — Telehealth: Payer: Self-pay | Admitting: Oncology

## 2020-04-09 ENCOUNTER — Telehealth: Payer: Self-pay | Admitting: Gynecologic Oncology

## 2020-04-09 NOTE — Telephone Encounter (Signed)
Left a message regarding appointments for 04/17/20. Requested a return call.

## 2020-04-09 NOTE — Telephone Encounter (Signed)
Scheduled new patient appointment with Dr. Alvy Bimler on 04/17/20 at 8:15.  Also rescheduled follow up with Dr. Berline Lopes to 9:30 the same day.  Advised her to arrive 15-20 minutes early to check in.  She verbalized agreement and said she will bring her sister to the appointments.

## 2020-04-09 NOTE — Telephone Encounter (Signed)
Patient called to that she has intermittent swelling and sharp pains in her urethra.  Pt reports this was going on before surgery on 7/20 .  She is concerned that endometriosis can cause kidney issues due to a case study that she read. Per pt, she is to start chemo on the 20th.  She is concerned that she needs a kidney and bladder study done before treatment begins to ensure nothing is wrong.  She is requesting to be seen by a urologist.  Patient if forwarding the case study she read for MD and NP to look over.  She would like a call from NP or MD due to her concerns.  Will make providers aware.

## 2020-04-09 NOTE — Telephone Encounter (Signed)
Called the patient to discuss her concerns about endometriosis and urethral symptoms. She had emailed a case report of endometriosis in a post-menopausal patient affecting the ureters and bladder and ultimately led to hydronephrosis.  I reviewed the difference between her urethral symptoms and a disease process affecting the ureters. I did not see any evidence of ureteral involvement by endometriosis at the time of surgery. Additionally, her kidney function was normal on her pre-op lab work. I reviewed that we will get a CT prior to starting chemotherapy. This is to look for evidence of metastatic disease (not seen at the time of surgery) and will also give [place] additional information if there is any abnormality of her GU tract.  I went into detail again the findings at the time of her surgery. She has had improvement of some symptoms (ammonia smelling discharge) but continues to have others (urethral discharge that she describes as somewhat foul smelling, labial shooting pains, etc). I did not see any anatomic abnormalities at the time of surgery. Her urethra and vaginal looked hypoestrogenic, but otherwise, there was nothing visible and biopsies/swabs were all negative.  She is very interested in pursuing Urologic work-up for her bladder/urethral symptoms. She may benefit from cystoscopy in this work-up.  She had questions about GU tract injury due to chemotherapy and whether chemotherapy would worsen her symptoms. I assured her that we follow kidney function closely during chemotherapy. As to whether her symptoms will change on chemotherapy, since I am unsure of the pathology causing them, it is difficult to say whether they will change. All of the patient's questions and concerns were answered.  Jeral Pinch MD Gynecologic Oncology

## 2020-04-16 ENCOUNTER — Other Ambulatory Visit: Payer: Self-pay | Admitting: Oncology

## 2020-04-16 ENCOUNTER — Telehealth: Payer: Self-pay | Admitting: Oncology

## 2020-04-16 NOTE — Telephone Encounter (Signed)
Called Emily [name] and advised her of GYN Conference Recommendation for adjuvant therapy with chemotherapy.  Also reviewed her appointments tomorrow with Dr. Alvy Bimler and Dr. Berline Lopes.  She verbalized understanding and agreement.

## 2020-04-16 NOTE — Progress Notes (Signed)
Gynecologic Oncology Multi-Disciplinary Disposition Conference Note  Date of the Conference: 04/16/2020  Patient Name: Emily [name]  Referring Provider: Dr. Marvel Plan Primary GYN Oncologist: Dr. Berline Lopes  Stage/Disposition:  Stage IIIA, grade 2 Disposition is to adjuvant therapy with chemotherapy.   This Multidisciplinary conference took place involving physicians from [place], [place], Radiation Oncology, Pathology, Radiology along with the Gynecologic Oncology Nurse Practitioner and RN.  Comprehensive assessment of the patient's malignancy, staging, need for surgery, chemotherapy, radiation therapy, and need for further testing were reviewed. Supportive measures, both inpatient and following discharge were also discussed. The recommended plan of care is documented. Greater than 35 minutes were spent correlating and coordinating this patient's care.

## 2020-04-17 ENCOUNTER — Inpatient Hospital Stay (HOSPITAL_BASED_OUTPATIENT_CLINIC_OR_DEPARTMENT_OTHER): Payer: 59 | Admitting: Gynecologic Oncology

## 2020-04-17 ENCOUNTER — Telehealth: Payer: Self-pay | Admitting: Hematology and Oncology

## 2020-04-17 ENCOUNTER — Inpatient Hospital Stay: Payer: 59 | Attending: Gynecologic Oncology

## 2020-04-17 ENCOUNTER — Encounter: Payer: 59 | Admitting: Gynecologic Oncology

## 2020-04-17 ENCOUNTER — Inpatient Hospital Stay: Payer: 59 | Admitting: Hematology and Oncology

## 2020-04-17 ENCOUNTER — Other Ambulatory Visit: Payer: Self-pay

## 2020-04-17 ENCOUNTER — Encounter: Payer: Self-pay | Admitting: Gynecologic Oncology

## 2020-04-17 ENCOUNTER — Encounter: Payer: Self-pay | Admitting: Hematology and Oncology

## 2020-04-17 ENCOUNTER — Encounter (HOSPITAL_COMMUNITY): Payer: Self-pay | Admitting: Gynecologic Oncology

## 2020-04-17 VITALS — BP 123/74 | HR 73 | Temp 98.2°F | Resp 18 | Ht 64.0 in | Wt 143.0 lb

## 2020-04-17 DIAGNOSIS — Z87440 Personal history of urinary (tract) infections: Secondary | ICD-10-CM

## 2020-04-17 DIAGNOSIS — C541 Malignant neoplasm of endometrium: Secondary | ICD-10-CM | POA: Insufficient documentation

## 2020-04-17 DIAGNOSIS — R599 Enlarged lymph nodes, unspecified: Secondary | ICD-10-CM | POA: Diagnosis not present

## 2020-04-17 DIAGNOSIS — Z7952 Long term (current) use of systemic steroids: Secondary | ICD-10-CM | POA: Diagnosis not present

## 2020-04-17 DIAGNOSIS — Z90722 Acquired absence of ovaries, bilateral: Secondary | ICD-10-CM | POA: Insufficient documentation

## 2020-04-17 DIAGNOSIS — E7800 Pure hypercholesterolemia, unspecified: Secondary | ICD-10-CM | POA: Diagnosis not present

## 2020-04-17 DIAGNOSIS — Z90710 Acquired absence of both cervix and uterus: Secondary | ICD-10-CM | POA: Insufficient documentation

## 2020-04-17 DIAGNOSIS — Z800 Family history of malignant neoplasm of digestive organs: Secondary | ICD-10-CM | POA: Insufficient documentation

## 2020-04-17 DIAGNOSIS — Z9079 Acquired absence of other genital organ(s): Secondary | ICD-10-CM

## 2020-04-17 DIAGNOSIS — Z79899 Other long term (current) drug therapy: Secondary | ICD-10-CM | POA: Diagnosis not present

## 2020-04-17 DIAGNOSIS — K219 Gastro-esophageal reflux disease without esophagitis: Secondary | ICD-10-CM

## 2020-04-17 DIAGNOSIS — R102 Pelvic and perineal pain: Secondary | ICD-10-CM

## 2020-04-17 DIAGNOSIS — Z791 Long term (current) use of non-steroidal anti-inflammatories (NSAID): Secondary | ICD-10-CM

## 2020-04-17 DIAGNOSIS — M8580 Other specified disorders of bone density and structure, unspecified site: Secondary | ICD-10-CM | POA: Insufficient documentation

## 2020-04-17 DIAGNOSIS — R300 Dysuria: Secondary | ICD-10-CM | POA: Insufficient documentation

## 2020-04-17 DIAGNOSIS — Z5111 Encounter for antineoplastic chemotherapy: Secondary | ICD-10-CM | POA: Insufficient documentation

## 2020-04-17 DIAGNOSIS — Z8249 Family history of ischemic heart disease and other diseases of the circulatory system: Secondary | ICD-10-CM

## 2020-04-17 DIAGNOSIS — E119 Type 2 diabetes mellitus without complications: Secondary | ICD-10-CM

## 2020-04-17 MED ORDER — PROCHLORPERAZINE MALEATE 10 MG PO TABS: 10.0000 mg | ORAL_TABLET | Freq: Four times a day (QID) | ORAL | 1 refills | Status: DC | PRN

## 2020-04-17 MED ORDER — ONDANSETRON HCL 8 MG PO TABS: 8.0000 mg | ORAL_TABLET | Freq: Three times a day (TID) | ORAL | 1 refills | Status: DC | PRN

## 2020-04-17 MED ORDER — DEXAMETHASONE 4 MG PO TABS
ORAL_TABLET | ORAL | 6 refills | Status: DC
Start: 2020-04-17 — End: 2020-08-17

## 2020-04-17 MED FILL — ONDANSETRON HCL 8 MG TABLET: 8 | 10 days supply | Qty: 30 | Fill #0

## 2020-04-17 MED FILL — DEXAMETHASONE 4 MG TABLET: 4 | 18 days supply | Qty: 36 | Fill #0

## 2020-04-17 MED FILL — PROCHLORPERAZINE 10 MG TAB: 10 | 8 days supply | Qty: 30 | Fill #0

## 2020-04-17 NOTE — Assessment & Plan Note (Signed)
I plan to order CT imaging for proper staging and will see her back next week for further discussion and follow-up  We reviewed the NCCN guidelines We discussed the role of chemotherapy. The intent is of curative intent.  We discussed some of the risks, benefits, side-effects of carboplatin & Taxol. Treatment is intravenous, every 3 weeks x 6 cycles  Some of the short term side-effects included, though not limited to, including weight loss, life threatening infections, risk of allergic reactions, need for transfusions of blood products, nausea, vomiting, change in bowel habits, loss of hair, admission to hospital for various reasons, and risks of death.   Long term side-effects are also discussed including risks of infertility, permanent damage to nerve function, hearing loss, chronic fatigue, kidney damage with possibility needing hemodialysis, and rare secondary malignancy including bone marrow disorders.  The patient is aware that the response rates discussed earlier is not guaranteed.  After a long discussion, patient made an informed decision to proceed with the prescribed plan of care.   Patient education material was dispensed. We discussed premedication with dexamethasone before chemotherapy. We discussed the use of cranial prosthesis and she would like to proceed with Dignicap She has reasonable venous access and would not need port placement I recommend the patient to consider application for disability over the next 6 months while on treatment

## 2020-04-17 NOTE — Progress Notes (Signed)
Gynecologic Oncology Return Clinic Visit  04/17/20  Reason for Visit: post-op follow-up, extensive counseling re treatment planning, vaginal introitus pain  Treatment History: Oncology History Overview Note  IHC MMR normal Endometrioid   Endometrial cancer (HCC)  02/28/2020 Initial Diagnosis   The patient presented for an exam on 6/22.  At that time she endorsed episodes of postmenopausal bleeding starting in 2020/12/08after her mother's death.  She had not had a menses in a number of years.  Patient's exam was limited by her intolerance in the position of the cervix.  Pap test was performed on 6/30 showing atypical glandular cells of undetermined significance. 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 She describes about a year long history previously of clear vaginal discharge. She saw her PCP for this complaint and given what was felt to be urethral swelling, she was treated for a UTI (lab testing did not show an infection). She has not been sexually active since her 30s. She endorses increased urinary frequency. After a year, the clear discharge became yellow (and at one point looked green). The patient was seen again and a pap was attempted in the office but she was unable to tolerate this. She performed a vaginal swab on herself that found yeast. She used Monistat multiple times with improvement in her discharge. She then began to have pale pink discharge. At this time, she was seen by a Urogynecologist at [facility] and it sound like she had urodynamic testing. She describes a raw feeling and burning with her urine. She continues to have vaginal discharge which she notes has an ammonia-like smell. Her vaginal bleeding started again about a month ago. She briefly tried vaginal estrogen after seeing the Urogynecologist but she felt that the bleeding worsened as did the ammonia smell, so she stopped.   03/16/2020 Initial Diagnosis   Endometrial  cancer (HCC)   03/21/2020 Imaging   1. Abnormally thickened endometrial complex measuring up to 12 mm, presumably related to known history of endometrial carcinoma. 2. Underlying fibroid uterus as detailed above. 3. Normal sonographic evaluation of the ovaries. No abnormal free fluid. Please note that the appendix appears to be potentially located in close proximity to the right ovary on this exam. As this patient is scheduled to undergo total hysterectomy with bilateral salpingo oophorectomy in the near future, close attention to this region at surgical intervention is recommended.     03/27/2020 Pathology Results   A. CUL DE SAC, ANTERIOR, BIOPSY:  -  Benign fibrovascular tissue with mesothelial hyperplasia  -  No carcinoma identified  -  See comment   B. MONS, RIGHT, BIOPSY:  -  Melanocytic nevus, intradermal type  -  See comment   C. LYMPH NODE, SENTINEL, RIGHT EXTERNAL ILIAC, BIOPSY:  -  No carcinoma identified in one lymph node (0/1)  -  See comment   D. PERITONEUM, LEFT SIDEWALL, BIOPSY:  -  Benign fibrovascular tissue with chronic inflammation  -  No carcinoma identified   E. LYMPH NODE, SENTINEL, LEFT EXTERNAL ILIAC, BIOPSY:  -  No carcinoma identified in one lymph node (0/1)  -  See comment   F. UTERUS, CERVIX, BILATERAL FALLOPIAN TUBES AND OVARIES:   Uterus:  -  Endometrioid carcinoma, FIGO grade 2  -  Leiomyomata (1.6 cm ; largest)  -  Carcinoma present in lymphoid aggregate in serosal adhesions  (lymphovascular space invasion)  -  See oncology table and comment below   Cervix:  -  No carcinoma identified   Bilateral Ovaries:  -  No carcinoma identified   Bilateral Fallopian tubes:  -  Carcinoma within lumen of fallopian tube  (left)   G. PERIURETHRAL, BIOPSY:  -  No carcinoma identified    03/27/2020 Surgery   Robotic-assisted laparoscopic total hysterectomy with bilateral salpingoophorectomy, SLN biopsy   On EUA, very narrow introitus most due to tight  hymenal ring. Small mobile uterus. Hyperpigmented 0.55m plaque on right mons, biopsied. Urethral overall normal in appearance, given hyperemic appearance in clinic, biopsy taken. On intra-abdominal entry, some scarring noted on inferior aspect of the liver, anterior right diaphragm and left lobe of the liver. Omentum normal appearing. Mesentery of the small and large bowel as well as the bowel itself studded with 1-250minflammatory-appearing nodules. Appendix with same studding, otherwise normal in appearance. Uterus 6cm and normal appearing with the exception of inflammatory exudate on posterior aspect of the fundus. Bilateral adnexa normal appearing. Chocolate brown staining within most of the cul-de-sac. Inflammatory appearing nodules studding bilateral pelvic walls, anterior and posterior cul-de-sac c/w endometriosis. No adenopathy. No intra-abdominal or pelvic evidence of disease.   03/2020 Initial Biopsy   EMB - gr 1-2 EMCA     Interval History: Overall, the patient feels that she has been improving since our phone conversation.  She reports improving abdominal and pelvic pain, and really feels now that the pain is related to her bladder.  She denies any fevers or chills although continues to have some cold intolerance.  She reports her appetite has improved and she denies any nausea or emesis.  She was having some minimal vaginal spotting, which resolved last week.  She now endorses some brown-tinged vaginal discharge versus discharge from her urethra that she feels has started to have the same ammonia smell that she had noticed of her discharge prior to surgery.  She endorses regular bowel function, and she was having to use Senokot, MiraLAX, and suppositories previously, but now is off all medication to aid with bowel function.  She continues to have a raw feeling and burning with her urination.  She met with Dr. GoAlvy Bimleroday. Plan is for her to start adjuvant chemotherapy next week.  Past  Medical/Surgical History: Past Medical History:  Diagnosis Date  . Abnormal EKG 112016-11-14. Anxiety and depression    pt denies 7/16/  . Bruises easily   . Chest pain   . Chest pain with moderate risk of acute coronary syndrome   . Chest tightness   . Difficult intubation    throat feels small pt has acid reflux ? scars due to acid   . Elevated cholesterol   . [place]  . Family history of premature CAD 112016-11-14 Father had MI 4079'sdied at 5055f an MI   . Fatigue   . Fibroid   . Fx sacrum/coccyx-closed ([place]  . Gastritis   . GERD (gastroesophageal reflux disease)   . Hyperlipidemia 1111/14/2016. Osteopenia   . Pigmented skin lesions   . Pre-diabetes   . Sinus problem   . SOB (shortness of breath) on exertion   . Urethra disorder    currently swollen per pt   . Vitamin D deficiency   . Weight gain     Past Surgical History:  Procedure Laterality Date  . ADENOIDECTOMY    . COLONOSCOPY    . ROBOTIC ASSISTED TOTAL HYSTERECTOMY WITH BILATERAL SALPINGO OOPHERECTOMY Bilateral 03/27/2020   Procedure: XI ROBOTIC ASSISTED  TOTAL HYSTERECTOMY WITH BILATERAL SALPINGO OOPHORECTOMY;  Surgeon: [name], MD;  Location: [facility];  Service: Gynecology;  Laterality: Bilateral;  . SENTINEL NODE BIOPSY N/A 03/27/2020   Procedure: SENTINEL NODE BIOPSY, URETHRAL BIOPSY AND VAGINAL CULTURE;  Surgeon: [name], MD;  Location: [facility];  Service: Gynecology;  Laterality: N/A;  . SMALL BOWEL ENTEROSCOPY    . TONSILLECTOMY      Family History  Problem Relation Age of Onset  . Atrial fibrillation Mother   . Hypertension Mother   . Stroke Mother   . Arthritis Mother   . Atrial fibrillation Father   . Heart attack Father 5  . Hypertension Father   . COPD Father   . Diabetes Father   . Colon cancer Paternal Grandmother   . Breast cancer Neg Hx     Social History   Socioeconomic History  . Marital status: Single    Spouse name: Not on file  .  Number of children: 0  . Years of education: 25  . Highest education level: Not on file  Occupational History  . Occupation: Therapist, sports  Tobacco Use  . Smoking status: Never Smoker  . Smokeless tobacco: Never Used  Vaping Use  . Vaping Use: Never used  Substance and Sexual Activity  . Alcohol use: Never  . Drug use: No  . Sexual activity: Not Currently  Other Topics Concern  . Not on file  Social History Narrative  . Not on file   Social Determinants of Health   Financial Resource Strain:   . Difficulty of Paying Living Expenses:   Food Insecurity:   . Worried About Charity fundraiser in the Last Year:   . Arboriculturist in the Last Year:   Transportation Needs:   . Film/video editor (Medical):   Marland Kitchen Lack of Transportation (Non-Medical):   Physical Activity:   . Days of Exercise per Week:   . Minutes of Exercise per Session:   Stress:   . Feeling of Stress :   Social Connections:   . Frequency of Communication with Friends and Family:   . Frequency of Social Gatherings with Friends and Family:   . Attends Religious Services:   . Active Member of Clubs or Organizations:   . Attends Archivist Meetings:   Marland Kitchen Marital Status:     Current Medications:  Current Outpatient Medications:  .  Calcium Carb-Cholecalciferol (CALCIUM-VITAMIN D3) 600-400 MG-UNIT TABS, Take 1 tablet by mouth daily., Disp: , Rfl:  .  Cholecalciferol (VITAMIN D) 125 MCG (5000 UT) CAPS, Take 5,000 Units by mouth daily., Disp: , Rfl:  .  ibuprofen (ADVIL) 800 MG tablet, Take 1 tablet (800 mg total) by mouth every 8 (eight) hours as needed for moderate pain. For AFTER surgery only, Disp: 30 tablet, Rfl: 0 .  oxyCODONE (OXY IR/ROXICODONE) 5 MG immediate release tablet, Take 1 tablet (5 mg total) by mouth every 4 (four) hours as needed for severe pain. For AFTER surgery, do not take and drive, Disp: 10 tablet, Rfl: 0 .  pantoprazole (PROTONIX) 40 MG tablet, Take 40 mg by mouth daily as needed (acid  reduction). , Disp: , Rfl:  .  senna-docusate (SENOKOT-S) 8.6-50 MG tablet, Take 2 tablets by mouth at bedtime. For AFTER surgery, do not take if having diarrhea, Disp: 30 tablet, Rfl: 0 .  CALCIUM PO, Take by mouth. (Patient not taking: Reported on 03/16/2020), Disp: , Rfl:  .  dexamethasone (DECADRON) 4 MG tablet,  Take 2 tabs at the night before and 2 tab the morning of chemotherapy, every 3 weeks, by mouth x 6 cycles, Disp: 36 tablet, Rfl: 6 .  Omega-3 Fatty Acids (FISH OIL PO), Take by mouth. (Patient not taking: Reported on 03/16/2020), Disp: , Rfl:  .  ondansetron (ZOFRAN) 8 MG tablet, Take 1 tablet (8 mg total) by mouth every 8 (eight) hours as needed. Start on the third day after chemotherapy., Disp: 30 tablet, Rfl: 1 .  prochlorperazine (COMPAZINE) 10 MG tablet, Take 1 tablet (10 mg total) by mouth every 6 (six) hours as needed (Nausea or vomiting)., Disp: 30 tablet, Rfl: 1 .  VITAMIN E PO, Take by mouth. (Patient not taking: Reported on 03/16/2020), Disp: , Rfl:   Review of Systems: Denies appetite changes, fevers, chills, fatigue, unexplained weight changes. Denies hearing loss, neck lumps or masses, mouth sores, ringing in ears or voice changes. Denies cough or wheezing.  Denies shortness of breath. Denies chest pain or palpitations. Denies leg swelling. Denies abdominal distention, pain, blood in stools, constipation, diarrhea, nausea, vomiting, or early satiety. Denies pain with intercourse, dysuria, frequency, hematuria or incontinence. Denies hot flashes, pelvic pain, vaginal bleeding or vaginal discharge.   Denies joint pain, back pain or muscle pain/cramps. Denies itching, rash, or wounds. Denies dizziness, headaches, numbness or seizures. Denies swollen lymph nodes or glands, denies easy bruising or bleeding. Denies anxiety, depression, confusion, or decreased concentration.  Physical Exam: Vitals:   04/17/20 0823  BP: 123/74  Pulse: 73  Resp: 18  Temp: 98.2 F (36.8 C)   SpO2: 100%      Filed Weights   04/17/20 0823  Weight: 143 lb (64.9 kg)   General: Alert, oriented, no acute distress. HEENT: Atraumatic, normocephalic, sclera anicteric. Chest: Unlabored breathing on room air. Abdomen: soft, nontender.  Normoactive bowel sounds.  No masses or hepatosplenomegaly appreciated.  Well-healing laparoscopic incisions, remaining Dermabond removed. Extremities: Grossly normal range of motion.  Warm, well perfused.  No edema bilaterally. Skin: No rashes or lesions noted. GU: Normal appearing external genitalia without erythema, excoriation, or lesions.  Gentle exam with pediatric speculum reveals that the episiotomy is healing well with suture still visible.  No exudate or erythema.  Patient continues to have tenderness at the narrow introitus/hymen, but overall less than before surgery.  Very minimal blood-tinged discharge noted within the vagina, unable to appreciate the cuff given her toleration of the exam.  Bimanual exam with a single digit reveals cuff is intact, no tenderness or fluctuance with palpation.   Laboratory & Radiologic Studies: none new  Assessment & Plan: Emily [name] is a 59 y.o. 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  Overall, the patient is healing well from a postoperative standpoint.  We discussed in detail the postoperative expectations over the next couple of weeks.  She is very interested in restarting vitamin E vaginal suppositories again.  I have asked her to wait until she is 6 weeks out from surgery.  We also discussed her return to work.  She very much enjoys her job and would like to continue working during treatment.  While some patients have significant side effects and have to take time off of work, work part-time, or take short-term disability, other patients are able to work through chemotherapy.  I have encouraged her to talk to  her boss to let her know that chemotherapy treatment will last about 4 months and that  it is difficult to predict how she will do from a symptom standpoint until we get started with treatment.  From a urethral and bladder symptom standpoint, these have not changed significantly since surgery.  I did not feel or appreciate any findings at the time of surgery that would explain her symptoms.  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  I think that some of her symptoms are related to atrophy.  She will undergo a pan CT scan prior to starting chemotherapy.  If this points to an obvious etiology in her urinary tract system, then we will plan based on these findings.  If the CT scan is negative, then we discussed again having her see a urologist or urogynecologist to discuss further work-up, such as cystoscopy.  We talked again about overall prognosis and risk of recurrence, as well as treatment recommendations.  39yroverall survival varies between approximately 67-85% from study to study. A multi-institutional analysis from 2009 showed a 550yrS of 75% in Stage IIIA2 (adnexal involvement). In a more recent population-based analysis of NCDB of patients with stage IIIA endometrioid endometrial cancer - 5y32yrerall survival was 83.7% in patients treated with CT+BT vs 70% with CT only.  When I had previously discussed vaginal brachytherapy with the patient, I have significant concerns given her narrow vagina as well as intolerance of exams.  We discussed this at our tumor conference yesterday, and I would like to try to perform a joint visit with Dr. KinSondra Cometh some pain medicine, both for me to get a good postoperative vaginal exam and also see if it would be feasible to move forward with vaginal brachytherapy.  The patient was amenable to this plan and we will get this scheduled in the next 4-6 weeks.  Per SGO surveillance  recommendations, we will plan to follow q 3 months after completion of adjuvant therapy for 2 years. We discussed signs and symptoms that would be concerning for disease recurrence. The patient knows to call to be seen sooner than her next scheduled visit if she develops any concerning symptoms.   1. [place], [place], Bro[place]ae[place]ochraneoh[place] analysis of recurrence and survival in stage IIIA endometrial cancer. Gynecol Oncol. 2009 Aug;114(2):279-83. doi: 10.1016/j.ygyno.2009.04.030. Epub 2009 May 15. PMID: [id number]. [place] P[place]yReymundo Polllame, PetBoris Sharpernd SteDelray Altmberis. Patterns of care and survival outcomes of stage IIIA endometrial cancer: An analysis of the NatCh[employer]ournal of Clinical Oncology 2018 36:15_suppl, [id number] minutes of total time was spent for this patient encounter, including preparation, face-to-face counseling with the patient and coordination of care, and documentation of the encounter.  KatJeral PinchD  Division of Gynecologic Oncology  Department of Obstetrics and Gynecology  [facility]

## 2020-04-17 NOTE — Patient Instructions (Signed)
You are doing great and healing well from surgery! I will see you at your appointment with Dr. Sondra Come. You can start using Vitamin E suppositories when you are 6 weeks out from surgery.

## 2020-04-17 NOTE — Assessment & Plan Note (Signed)
She has small chronic discomfort in her pelvis with urination and has persistent vaginal/urinary discharge As above, I plan to order CT imaging for evaluation She might need urology referral

## 2020-04-17 NOTE — Telephone Encounter (Signed)
Scheduled appts per 8/10 sch msg. Gave pt a print out of AVS.

## 2020-04-17 NOTE — Progress Notes (Signed)
Emily [name] NOTE  Patient Care Team: [name], MD as PCP - General [name])  ASSESSMENT & PLAN:  Endometrial cancer [name]) I plan to order CT imaging for proper staging and will see her back next week for further discussion and follow-up  We reviewed the NCCN guidelines We discussed the role of chemotherapy. The intent is of curative intent.  We discussed some of the risks, benefits, side-effects of carboplatin & Taxol. Treatment is intravenous, every 3 weeks x 6 cycles  Some of the short term side-effects included, though not limited to, including weight loss, life threatening infections, risk of allergic reactions, need for transfusions of blood products, nausea, vomiting, change in bowel habits, loss of hair, admission to hospital for various reasons, and risks of death.   Long term side-effects are also discussed including risks of infertility, permanent damage to nerve function, hearing loss, chronic fatigue, kidney damage with possibility needing hemodialysis, and rare secondary malignancy including bone marrow disorders.  The patient is aware that the response rates discussed earlier is not guaranteed.  After a long discussion, patient made an informed decision to proceed with the prescribed plan of care.   Patient education material was dispensed. We discussed premedication with dexamethasone before chemotherapy. We discussed the use of cranial prosthesis and she would like to proceed with Dignicap She has reasonable venous access and would not need port placement I recommend the patient to consider application for disability over the next 6 months while on treatment  Pelvic pain She has small chronic discomfort in her pelvis with urination and has persistent vaginal/urinary discharge As above, I plan to order CT imaging for evaluation She might need urology referral   Orders Placed This Encounter  Procedures  . CT CHEST ABDOMEN PELVIS W  CONTRAST    Standing Status:   Future    Standing Expiration Date:   04/16/2021    Order Specific Question:   Reason for Exam (SYMPTOM  OR DIAGNOSIS REQUIRED)    Answer:   recent stage 3 uterine ca, for staging    Order Specific Question:   Preferred imaging location?    Answer:   [facility]    Order Specific Question:   Radiology Contrast Protocol - do NOT remove file path    Answer:   [email or web address]    Order Specific Question:   Is patient pregnant?    Answer:   No  . CBC with Differential (Cancer Center Only)    Standing Status:   Standing    Number of Occurrences:   20    Standing Expiration Date:   04/17/2021  . CMP ([place] only)    Standing Status:   Standing    Number of Occurrences:   20    Standing Expiration Date:   04/17/2021    The total time spent in the appointment was 70 minutes encounter with patients including review of chart and various tests results, discussions about plan of care and coordination of care plan   All questions were answered. The patient knows to call the clinic with any problems, questions or concerns. No barriers to learning was detected.  [name], MD 8/10/202110:35 AM  CHIEF COMPLAINTS/PURPOSE OF CONSULTATION:  Urine cancer, for adjuvant treatment  HISTORY OF PRESENTING ILLNESS:  Emily [name] 59 y.o. 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 Summary of oncologic history is as follows: Oncology History Overview Note  [facility] MMR normal Endometrioid   Endometrial cancer ([place])  02/28/2020 Initial Diagnosis   The patient presented for an exam on 6/22.  At that time she endorsed episodes of postmenopausal bleeding starting in Aug 21, 2019 after her mother's death.  She had not had a menses in a number of years.  Patient's exam was limited by her intolerance in the position of the cervix.  Pap test was performed on 6/30 showing atypical glandular cells of undetermined significance. 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 She describes about a year long history previously of clear vaginal discharge. She saw her PCP for this complaint and given what was felt to be urethral swelling, she was treated for a UTI (lab testing did not show an infection). She has not been sexually active since her 65s. She endorses increased urinary frequency. After a year, the clear discharge became yellow (and at one point looked green). The patient was seen again and a pap was attempted in the office but she was unable to tolerate this. She performed a vaginal swab on herself that found yeast. She used Monistat multiple times with improvement in her discharge. She then began to have pale pink discharge. At this time, she was seen by a Urogynecologist at [facility] and it sound like she had urodynamic testing. She describes a raw feeling and burning with her urine. She continues to have vaginal discharge which she notes has an ammonia-like smell. Her vaginal bleeding started again about a month ago. She briefly tried vaginal estrogen after seeing the Urogynecologist but she felt that the bleeding worsened as did the ammonia smell, so she stopped.   03/16/2020 Initial Diagnosis   Endometrial cancer ([place])   03/21/2020 Imaging   1. Abnormally thickened endometrial complex measuring up to 12 mm, presumably related to known history of endometrial carcinoma. 2. Underlying fibroid uterus as detailed above. 3. Normal sonographic evaluation of the ovaries. No abnormal free fluid. Please note that the appendix appears to be potentially located in close proximity to the right ovary on this exam. As this patient is  scheduled to undergo total hysterectomy with bilateral salpingo oophorectomy in the near future, close attention to this region at surgical intervention is recommended.     03/27/2020 Pathology Results   A. CUL DE SAC, ANTERIOR, BIOPSY:  -  Benign fibrovascular tissue with mesothelial hyperplasia  -  No carcinoma identified  -  See comment   B. MONS, RIGHT, BIOPSY:  -  Melanocytic nevus, intradermal type  -  See comment   C. LYMPH NODE, SENTINEL, RIGHT EXTERNAL ILIAC, BIOPSY:  -  No carcinoma identified in one lymph node (0/1)  -  See comment   D. PERITONEUM, LEFT SIDEWALL, BIOPSY:  -  Benign fibrovascular tissue with chronic inflammation  -  No carcinoma identified   E. LYMPH NODE, SENTINEL, LEFT EXTERNAL ILIAC, BIOPSY:  -  No carcinoma identified in one lymph node (0/1)  -  See comment   F. UTERUS, CERVIX, BILATERAL FALLOPIAN TUBES AND OVARIES:   Uterus:  -  Endometrioid carcinoma, FIGO grade 2  -  Leiomyomata (1.6 cm ; largest)  -  Carcinoma present in lymphoid aggregate in serosal adhesions  (lymphovascular space invasion)  -  See oncology table  and comment below   Cervix:  -  No carcinoma identified   Bilateral Ovaries:  -  No carcinoma identified   Bilateral Fallopian tubes:  -  Carcinoma within lumen of fallopian tube  (left)   G. PERIURETHRAL, BIOPSY:  -  No carcinoma identified    03/27/2020 Surgery   Robotic-assisted laparoscopic total hysterectomy with bilateral salpingoophorectomy, SLN biopsy   On EUA, very narrow introitus most due to tight hymenal ring. Small mobile uterus. Hyperpigmented 0.51m plaque on right mons, biopsied. Urethral overall normal in appearance, given hyperemic appearance in clinic, biopsy taken. On intra-abdominal entry, some scarring noted on inferior aspect of the liver, anterior right diaphragm and left lobe of the liver. Omentum normal appearing. Mesentery of the small and large bowel as well as the bowel itself studded with 1-273m inflammatory-appearing nodules. Appendix with same studding, otherwise normal in appearance. Uterus 6cm and normal appearing with the exception of inflammatory exudate on posterior aspect of the fundus. Bilateral adnexa normal appearing. Chocolate brown staining within most of the cul-de-sac. Inflammatory appearing nodules studding bilateral pelvic walls, anterior and posterior cul-de-sac c/w endometriosis. No adenopathy. No intra-abdominal or pelvic evidence of disease.   03/2020 Initial Biopsy   EMB - gr 1-2 EMCA    She continues to have chronic intermittent urinary leakage with discharge She has mild urinary discomfort on a regular basis Urinalysis and urine culture showed no evidence of infection Her abdominal pain has improved since surgery She have regular bowel movement but usually with loose stool, improved since surgery  MEDICAL HISTORY:  Past Medical History:  Diagnosis Date  . Abnormal EKG 1110-Nov-2016. Anxiety and depression    pt denies 7/16/  . Bruises easily   . Chest pain   . Chest pain with moderate risk of acute coronary syndrome   . Chest tightness   . Difficult intubation    throat feels small pt has acid reflux ? scars due to acid   . Elevated cholesterol   . [place]ometrial [place]  . Family history of premature CAD 1111-06-2015 Father had MI 4052'sdied at 5086f an MI   . Fatigue   . Fibroid   . F[place]  . Gastritis   . GERD (gastroesophageal reflux disease)   . Hyperlipidemia 1110-Nov-2016. Osteopenia   . Pigmented skin lesions   . Pre-diabetes   . Sinus problem   . SOB (shortness of breath) on exertion   . Urethra disorder    currently swollen per pt   . Vitamin D deficiency   . Weight gain     SURGICAL HISTORY: Past Surgical History:  Procedure Laterality Date  . ADENOIDECTOMY    . COLONOSCOPY    . ROBOTIC ASSISTED TOTAL HYSTERECTOMY WITH BILATERAL SALPINGO OOPHERECTOMY Bilateral 03/27/2020   Procedure: XI ROBOTIC ASSISTED  TOTAL HYSTERECTOMY WITH BILATERAL SALPINGO OOPHORECTOMY;  Surgeon: TuLafonda MossesMD;  Location: [facility];  Service: Gynecology;  Laterality: Bilateral;  . SENTINEL NODE BIOPSY N/A 03/27/2020   Procedure: SENTINEL NODE BIOPSY, URETHRAL BIOPSY AND VAGINAL CULTURE;  Surgeon: TuLafonda MossesMD;  Location: [facility];  Service: Gynecology;  Laterality: N/A;  . SMALL BOWEL ENTEROSCOPY    . TONSILLECTOMY      SOCIAL HISTORY: Social History   Socioeconomic History  . Marital status: Single    Spouse name: Not on file  . Number of children: 0  . Years of education: 16  . Highest education level:  Not on file  Occupational History  . Occupation: Therapist, sports  Tobacco Use  . Smoking status: Never Smoker  . Smokeless tobacco: Never Used  Vaping Use  . Vaping Use: Never used  Substance and Sexual Activity  . Alcohol use: Never  . Drug use: No  . Sexual activity: Not Currently  Other Topics Concern  . Not on file  Social History Narrative  . Not on file   Social Determinants of Health   Financial Resource Strain:   . Difficulty of Paying Living Expenses:   Food Insecurity:   . Worried About Charity fundraiser in the Last Year:   . Arboriculturist in the Last Year:   Transportation Needs:   . Film/video editor (Medical):   Marland Kitchen Lack of Transportation (Non-Medical):   Physical Activity:   . Days of Exercise per Week:   . Minutes of Exercise per Session:   Stress:   . Feeling of Stress :   Social Connections:   . Frequency of Communication with Friends and Family:   . Frequency of Social Gatherings with Friends and Family:   . Attends Religious Services:   . Active Member of Clubs or Organizations:   . Attends Archivist Meetings:   Marland Kitchen Marital Status:   Intimate Partner Violence:   . Fear of Current or Ex-Partner:   . Emotionally Abused:   Marland Kitchen Physically Abused:   . Sexually Abused:     FAMILY HISTORY: Family History  Problem Relation Age of Onset  . Atrial  fibrillation Mother   . Hypertension Mother   . Stroke Mother   . Arthritis Mother   . Atrial fibrillation Father   . Heart attack Father 38  . Hypertension Father   . COPD Father   . Diabetes Father   . Colon cancer Paternal Grandmother   . Breast cancer Neg Hx     ALLERGIES:  is allergic to ceftin [cefuroxime], crestor [rosuvastatin calcium], and latex.  MEDICATIONS:  Current Outpatient Medications  Medication Sig Dispense Refill  . Calcium Carb-Cholecalciferol (CALCIUM-VITAMIN D3) 600-400 MG-UNIT TABS Take 1 tablet by mouth daily.    Marland Kitchen CALCIUM PO Take by mouth. (Patient not taking: Reported on 03/16/2020)    . Cholecalciferol (VITAMIN D) 125 MCG (5000 UT) CAPS Take 5,000 Units by mouth daily.    Marland Kitchen dexamethasone (DECADRON) 4 MG tablet Take 2 tabs at the night before and 2 tab the morning of chemotherapy, every 3 weeks, by mouth x 6 cycles 36 tablet 6  . ibuprofen (ADVIL) 800 MG tablet Take 1 tablet (800 mg total) by mouth every 8 (eight) hours as needed for moderate pain. For AFTER surgery only 30 tablet 0  . Omega-3 Fatty Acids (FISH OIL PO) Take by mouth. (Patient not taking: Reported on 03/16/2020)    . ondansetron (ZOFRAN) 8 MG tablet Take 1 tablet (8 mg total) by mouth every 8 (eight) hours as needed. Start on the third day after chemotherapy. 30 tablet 1  . oxyCODONE (OXY IR/ROXICODONE) 5 MG immediate release tablet Take 1 tablet (5 mg total) by mouth every 4 (four) hours as needed for severe pain. For AFTER surgery, do not take and drive 10 tablet 0  . pantoprazole (PROTONIX) 40 MG tablet Take 40 mg by mouth daily as needed (acid reduction).     . prochlorperazine (COMPAZINE) 10 MG tablet Take 1 tablet (10 mg total) by mouth every 6 (six) hours as needed (Nausea or vomiting). 30 tablet 1  .  senna-docusate (SENOKOT-S) 8.6-50 MG tablet Take 2 tablets by mouth at bedtime. For AFTER surgery, do not take if having diarrhea 30 tablet 0  . VITAMIN E PO Take by mouth. (Patient not taking:  Reported on 03/16/2020)     No current facility-administered medications for this visit.    REVIEW OF SYSTEMS:   Constitutional: Denies fevers, chills or abnormal night sweats Eyes: Denies blurriness of vision, double vision or watery eyes Ears, nose, mouth, throat, and face: Denies mucositis or sore throat Respiratory: Denies cough, dyspnea or wheezes Cardiovascular: Denies palpitation, chest discomfort or lower extremity swelling Gastrointestinal:  Denies nausea, heartburn or change in bowel habits Skin: Denies abnormal skin rashes Lymphatics: Denies new lymphadenopathy or easy bruising Neurological:Denies numbness, tingling or new weaknesses Behavioral/Psych: Mood is stable, no new changes  All other systems were reviewed with the patient and are negative.  PHYSICAL EXAMINATION: ECOG PERFORMANCE STATUS: 1 - Symptomatic but completely ambulatory  Vitals:   04/17/20 0823  BP: 123/74  Pulse: 73  Resp: 18  Temp: 98.2 F (36.8 C)  SpO2: 100%   Filed Weights   04/17/20 0823  Weight: 143 lb (64.9 kg)    GENERAL:alert, no distress and comfortable NEURO: no focal motor/sensory deficits  LABORATORY DATA:  I have reviewed the data as listed Lab Results  Component Value Date   WBC 6.8 03/23/2020   HGB 12.6 03/23/2020   HCT 39.7 03/23/2020   MCV 87.1 03/23/2020   PLT 290 03/23/2020   Recent Labs    03/23/20 0850  NA 139  K 4.1  CL 104  CO2 26  GLUCOSE 96  BUN 12  CREATININE 0.81  CALCIUM 8.9  GFRNONAA >60  GFRAA >60  PROT 7.2  ALBUMIN 4.3  AST 18  ALT 13  ALKPHOS 62  BILITOT 0.7

## 2020-04-17 NOTE — Progress Notes (Signed)
START ON PATHWAY REGIMEN - Uterine     A cycle is every 21 days:     Paclitaxel      Carboplatin   **Always confirm dose/schedule in your pharmacy ordering system**  Patient Characteristics: Endometrioid, Newly Diagnosed, Postoperative (Pathologic Staging), Stage IIIA - Grade 1, 2, or 3 Histology: Endometrioid Therapeutic Status: Newly Diagnosed, Postoperative (Pathologic Staging) AJCC T Category: pT3 AJCC N Category: pN0 AJCC 8 Stage Grouping: Unknown AJCC M Category: cM0 Intent of Therapy: Curative Intent, Discussed with Patient

## 2020-04-19 ENCOUNTER — Telehealth: Payer: Self-pay | Admitting: *Deleted

## 2020-04-19 NOTE — Telephone Encounter (Signed)
Called and left the patient a message with the appts for Dr Sondra Come

## 2020-04-23 ENCOUNTER — Ambulatory Visit (HOSPITAL_COMMUNITY)
Admission: RE | Admit: 2020-04-23 | Discharge: 2020-04-23 | Disposition: A | Discharge status: Home / Self Care | Payer: 59 | Source: Ambulatory Visit | Attending: Hematology and Oncology | Admitting: Hematology and Oncology

## 2020-04-23 ENCOUNTER — Encounter: Payer: Self-pay | Admitting: Hematology and Oncology

## 2020-04-23 ENCOUNTER — Other Ambulatory Visit: Payer: Self-pay

## 2020-04-23 DIAGNOSIS — C55 Malignant neoplasm of uterus, part unspecified: Secondary | ICD-10-CM | POA: Diagnosis not present

## 2020-04-23 DIAGNOSIS — C541 Malignant neoplasm of endometrium: Secondary | ICD-10-CM | POA: Insufficient documentation

## 2020-04-23 DIAGNOSIS — Z90722 Acquired absence of ovaries, bilateral: Secondary | ICD-10-CM | POA: Diagnosis not present

## 2020-04-23 DIAGNOSIS — J984 Other disorders of lung: Secondary | ICD-10-CM | POA: Diagnosis not present

## 2020-04-23 DIAGNOSIS — Z90710 Acquired absence of both cervix and uterus: Secondary | ICD-10-CM | POA: Diagnosis not present

## 2020-04-23 DIAGNOSIS — M47816 Spondylosis without myelopathy or radiculopathy, lumbar region: Secondary | ICD-10-CM | POA: Diagnosis not present

## 2020-04-23 MED ORDER — IOHEXOL 300 MG/ML  SOLN
100.0000 mL | Freq: Once | INTRAMUSCULAR | Status: AC | PRN
  Administered 2020-04-23: 100 mL via INTRAVENOUS

## 2020-04-23 MED ORDER — SODIUM CHLORIDE (PF) 0.9 % IJ SOLN
INTRAMUSCULAR | Status: AC
  Filled 2020-04-23: qty 50

## 2020-04-23 NOTE — Progress Notes (Signed)
Called pt to introduce myself as her Arboriculturist.  Unfortunately there aren't any foundations offering copay assistance for her Dx.  I informed her of the [employer], went over what it covers and gave her the income requirement.  She stated she exceeds that amount so she doesn't qualify for the grant at this time.  I requested the registration staff give her my card in case her situation changes and for any questions or concerns she may have in the future.

## 2020-04-23 NOTE — Progress Notes (Signed)
Pharmacist Chemotherapy Monitoring - Initial Assessment    Anticipated start date: 04/27/20  Regimen:  . Are orders appropriate based on the patient's diagnosis, regimen, and cycle? Yes . Does the plan date match the patient's scheduled date? Yes . Is the sequencing of drugs appropriate? Yes . Are the premedications appropriate for the patient's regimen? Yes . Prior Authorization for treatment is: Pending o If applicable, is the correct biosimilar selected based on the patient's insurance? not applicable  Organ Function and Labs: Marland Kitchen Are dose adjustments needed based on the patient's renal function, hepatic function, or hematologic function?  . Are appropriate labs ordered prior to the start of patient's treatment? Yes . Other organ system assessment, if indicated: N/A . The following baseline labs, if indicated, have been ordered: N/A  Dose Assessment: . Are the drug doses appropriate? Yes . Are the following correct: o Drug concentrations Yes o IV fluid compatible with drug Yes o Administration routes Yes o Timing of therapy Yes . If applicable, does the patient have documented access for treatment and/or plans for port-a-cath placement? no . If applicable, have lifetime cumulative doses been properly documented and assessed? yes Lifetime Dose Tracking  No doses have been documented on this patient for the following tracked chemicals: Doxorubicin, Epirubicin, Idarubicin, Daunorubicin, Mitoxantrone, Bleomycin, Oxaliplatin, Carboplatin, Liposomal Doxorubicin  o   Toxicity Monitoring/Prevention: . The patient has the following take home antiemetics prescribed: Ondansetron, Prochlorperazine and Dexamethasone . The patient has the following take home medications prescribed: N/A . Medication allergies and previous infusion related reactions, if applicable, have been reviewed and addressed. Yes . The patient's current medication list has been assessed for drug-drug interactions with their  chemotherapy regimen. no significant drug-drug interactions were identified on review.  Order Review: . Are the treatment plan orders signed? Yes . Is the patient scheduled to see a provider prior to their treatment? Yes  I verify that I have reviewed each item in the above checklist and answered each question accordingly.  Adelina Mings 04/23/2020 10:23 AM

## 2020-04-24 ENCOUNTER — Encounter: Payer: Self-pay | Admitting: Hematology and Oncology

## 2020-04-24 ENCOUNTER — Other Ambulatory Visit: Payer: Self-pay

## 2020-04-24 ENCOUNTER — Telehealth: Payer: Self-pay | Admitting: Hematology and Oncology

## 2020-04-24 ENCOUNTER — Inpatient Hospital Stay: Payer: 59

## 2020-04-24 ENCOUNTER — Inpatient Hospital Stay (HOSPITAL_BASED_OUTPATIENT_CLINIC_OR_DEPARTMENT_OTHER): Payer: 59 | Admitting: Hematology and Oncology

## 2020-04-24 DIAGNOSIS — R102 Pelvic and perineal pain: Secondary | ICD-10-CM | POA: Diagnosis not present

## 2020-04-24 DIAGNOSIS — Z90710 Acquired absence of both cervix and uterus: Secondary | ICD-10-CM | POA: Diagnosis not present

## 2020-04-24 DIAGNOSIS — C541 Malignant neoplasm of endometrium: Secondary | ICD-10-CM

## 2020-04-24 DIAGNOSIS — R300 Dysuria: Secondary | ICD-10-CM | POA: Diagnosis not present

## 2020-04-24 DIAGNOSIS — Z79899 Other long term (current) drug therapy: Secondary | ICD-10-CM | POA: Diagnosis not present

## 2020-04-24 DIAGNOSIS — Z5111 Encounter for antineoplastic chemotherapy: Secondary | ICD-10-CM | POA: Diagnosis not present

## 2020-04-24 DIAGNOSIS — Z800 Family history of malignant neoplasm of digestive organs: Secondary | ICD-10-CM | POA: Diagnosis not present

## 2020-04-24 DIAGNOSIS — Z8249 Family history of ischemic heart disease and other diseases of the circulatory system: Secondary | ICD-10-CM | POA: Diagnosis not present

## 2020-04-24 DIAGNOSIS — R599 Enlarged lymph nodes, unspecified: Secondary | ICD-10-CM | POA: Diagnosis not present

## 2020-04-24 DIAGNOSIS — Z87440 Personal history of urinary (tract) infections: Secondary | ICD-10-CM | POA: Diagnosis not present

## 2020-04-24 LAB — CMP (CANCER CENTER ONLY)
ALT: 11 U/L (ref 0–44)
AST: 14 U/L — ABNORMAL LOW (ref 15–41)
Albumin: 4.3 g/dL (ref 3.5–5.0)
Alkaline Phosphatase: 69 U/L (ref 38–126)
Anion gap: 9 (ref 5–15)
BUN: 12 mg/dL (ref 6–20)
CO2: 27 mmol/L (ref 22–32)
Calcium: 10.2 mg/dL (ref 8.9–10.3)
Chloride: 105 mmol/L (ref 98–111)
Creatinine: 0.88 mg/dL (ref 0.44–1.00)
GFR, Est AFR Am: >60 mL/min (ref >60)
GFR, Estimated: >60 mL/min (ref >60)
Glucose, Bld: 101 mg/dL — ABNORMAL HIGH (ref 70–99)
Potassium: 4.3 mmol/L (ref 3.5–5.1)
Sodium: 141 mmol/L (ref 135–145)
Total Bilirubin: 0.6 mg/dL (ref 0.3–1.2)
Total Protein: 7.2 g/dL (ref 6.5–8.1)

## 2020-04-24 LAB — CBC WITH DIFFERENTIAL (CANCER CENTER ONLY)
Abs Immature Granulocytes: 0.01 10*3/uL (ref 0.00–0.07)
Basophils Absolute: 0.1 10*3/uL (ref 0.0–0.1)
Basophils Relative: 1 %
Eosinophils Absolute: 0.1 10*3/uL (ref 0.0–0.5)
Eosinophils Relative: 2 %
HCT: 38.3 % (ref 36.0–46.0)
Hemoglobin: 12.6 g/dL (ref 12.0–15.0)
Immature Granulocytes: 0 %
Lymphocytes Relative: 28 %
Lymphs Abs: 1.7 10*3/uL (ref 0.7–4.0)
MCH: 27.6 pg (ref 26.0–34.0)
MCHC: 32.9 g/dL (ref 30.0–36.0)
MCV: 84 fL (ref 80.0–100.0)
Monocytes Absolute: 0.4 10*3/uL (ref 0.1–1.0)
Monocytes Relative: 6 %
Neutro Abs: 3.9 10*3/uL (ref 1.7–7.7)
Neutrophils Relative %: 63 %
Platelet Count: 264 10*3/uL (ref 150–400)
RBC: 4.56 MIL/uL (ref 3.87–5.11)
RDW: 13.5 % (ref 11.5–15.5)
WBC Count: 6.1 10*3/uL (ref 4.0–10.5)
nRBC: 0 % (ref 0.0–0.2)

## 2020-04-24 NOTE — Telephone Encounter (Signed)
Scheduled appts per 8/17 sch msg. Gave pt a print out of AVS.

## 2020-04-24 NOTE — Progress Notes (Signed)
[facility] OFFICE PROGRESS NOTE  Patient Care Team: Shirlean Mylar, MD as PCP - General (Family Medicine)  ASSESSMENT & PLAN:  Endometrial cancer [facility]) I have reviewed CT imaging with the patient I have reviewed her surgical report with the patient and her sister At this point in time, I have no proof that she has bladder invasion causing her urinary symptoms We discussed the role of adjuvant treatment and we will proceed as scheduled The patient does not want hair preservation and we will modify her appointments She desired to work intermittently during treatment and I will try to accommodate to her needs   Pelvic pain She has symptoms of chronic dysuria and abnormal discharge even after surgery We reviewed her CT imaging extensively There is some bladder wall thickening However, this is not indicated if of bladder involvement from cancer We discussed the role of cystoscopy for further evaluation I recommend we hold off until after at least 1 cycle of treatment to see if her symptoms will improve further We also discussed the role of pain management   No orders of the defined types were placed in this encounter.   All questions were answered. The patient knows to call the clinic with any problems, questions or concerns. The total time spent in the appointment was 40 minutes encounter with patients including review of chart and various tests results, discussions about plan of care and coordination of care plan   Artis Delay, MD 04/24/2020 5:11 PM  INTERVAL HISTORY: Please see below for problem oriented charting. She returns with her sister, Chip Boer for further follow-up The purpose of today's visit is to evaluate her bladder symptoms to correlate with CT imaging studies She continues to have occasional foul-smelling urinary discharge and significant pain when she urinates Her appetite is fair We also discussed the use of cranial prosthetics and hair preservation and  she ultimately declined the use of Dignicap She desire to wear intermittently throughout treatment  SUMMARY OF ONCOLOGIC HISTORY: Oncology History Overview Note  IHC MMR normal Endometrioid MSI-stable   Endometrial cancer (HCC)  02/28/2020 Initial Diagnosis   The patient presented for an exam on 6/22.  At that time she endorsed episodes of postmenopausal bleeding starting in 12/01/2020after her mother's death.  She had not had a menses in a number of years.  Patient's exam was limited by her intolerance in the position of the cervix.  Pap test was performed on 6/30 showing atypical glandular cells of undetermined significance. 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 She describes about a year long history previously of clear vaginal discharge. She saw her PCP for this complaint and given what was felt to be urethral swelling, she was treated for a UTI (lab testing did not show an infection). She has not been sexually active since her 30s. She endorses increased urinary frequency. After a year, the clear discharge became yellow (and at one point looked green). The patient was seen again and a pap was attempted in the office but she was unable to tolerate this. She performed a vaginal swab on herself that found yeast. She used Monistat multiple times with improvement in her discharge. She then began to have pale pink discharge. At this time, she was seen by a Urogynecologist at [facility] and it sound like she had urodynamic testing. She describes a raw feeling and burning with her urine. She continues to have vaginal discharge which she notes has an Occupational psychologist  smell. Her vaginal bleeding started again about a month ago. She briefly tried vaginal estrogen after seeing the Urogynecologist but she felt that the bleeding worsened as did the ammonia smell, so she stopped.   03/16/2020 Initial Diagnosis   Endometrial cancer ([place])   03/21/2020 Imaging    1. Abnormally thickened endometrial complex measuring up to 12 mm, presumably related to known history of endometrial carcinoma. 2. Underlying fibroid uterus as detailed above. 3. Normal sonographic evaluation of the ovaries. No abnormal free fluid. Please note that the appendix appears to be potentially located in close proximity to the right ovary on this exam. As this patient is scheduled to undergo total hysterectomy with bilateral salpingo oophorectomy in the near future, close attention to this region at surgical intervention is recommended.     03/27/2020 Pathology Results   A. CUL DE SAC, ANTERIOR, BIOPSY:  -  Benign fibrovascular tissue with mesothelial hyperplasia  -  No carcinoma identified  -  See comment   B. MONS, RIGHT, BIOPSY:  -  Melanocytic nevus, intradermal type  -  See comment   C. LYMPH NODE, SENTINEL, RIGHT EXTERNAL ILIAC, BIOPSY:  -  No carcinoma identified in one lymph node (0/1)  -  See comment   D. PERITONEUM, LEFT SIDEWALL, BIOPSY:  -  Benign fibrovascular tissue with chronic inflammation  -  No carcinoma identified   E. LYMPH NODE, SENTINEL, LEFT EXTERNAL ILIAC, BIOPSY:  -  No carcinoma identified in one lymph node (0/1)  -  See comment   F. UTERUS, CERVIX, BILATERAL FALLOPIAN TUBES AND OVARIES:   Uterus:  -  Endometrioid carcinoma, FIGO grade 2  -  Leiomyomata (1.6 cm ; largest)  -  Carcinoma present in lymphoid aggregate in serosal adhesions  (lymphovascular space invasion)  -  See oncology table and comment below   Cervix:  -  No carcinoma identified   Bilateral Ovaries:  -  No carcinoma identified   Bilateral Fallopian tubes:  -  Carcinoma within lumen of fallopian tube  (left)   G. PERIURETHRAL, BIOPSY:  -  No carcinoma identified    03/27/2020 Surgery   Robotic-assisted laparoscopic total hysterectomy with bilateral salpingoophorectomy, SLN biopsy   On EUA, very narrow introitus most due to tight hymenal ring. Small mobile uterus.  Hyperpigmented 0.87m plaque on right mons, biopsied. Urethral overall normal in appearance, given hyperemic appearance in clinic, biopsy taken. On intra-abdominal entry, some scarring noted on inferior aspect of the liver, anterior right diaphragm and left lobe of the liver. Omentum normal appearing. Mesentery of the small and large bowel as well as the bowel itself studded with 1-237minflammatory-appearing nodules. Appendix with same studding, otherwise normal in appearance. Uterus 6cm and normal appearing with the exception of inflammatory exudate on posterior aspect of the fundus. Bilateral adnexa normal appearing. Chocolate brown staining within most of the cul-de-sac. Inflammatory appearing nodules studding bilateral pelvic walls, anterior and posterior cul-de-sac c/w endometriosis. No adenopathy. No intra-abdominal or pelvic evidence of disease.   03/2020 Initial Biopsy   EMB - gr 1-2 EMCA   03/27/2020 Cancer Staging   Staging form: Corpus Uteri - Carcinoma and Carcinosarcoma, AJCC 8th Edition - Clinical stage from 03/27/2020: FIGO Stage IIIA (cT3a, cN0(sn), cM0) - Signed by GoHeath LarkMD on 04/24/2020   04/23/2020 Imaging   Status post hysterectomy and bilateral salpingo-oophorectomy.   Mild stranding along the anterior transverse mesocolon. No frank peritoneal nodularity or omental caking. Attention on follow-up is suggested.   No findings specific  for recurrent or metastatic disease.   Bladder is mildly thick-walled although underdistended.       REVIEW OF SYSTEMS:   Constitutional: Denies fevers, chills or abnormal weight loss Eyes: Denies blurriness of vision Ears, nose, mouth, throat, and face: Denies mucositis or sore throat Respiratory: Denies cough, dyspnea or wheezes Cardiovascular: Denies palpitation, chest discomfort or lower extremity swelling Gastrointestinal:  Denies nausea, heartburn or change in bowel habits Skin: Denies abnormal skin rashes Lymphatics: Denies new  lymphadenopathy or easy bruising Neurological:Denies numbness, tingling or new weaknesses Behavioral/Psych: Mood is stable, no new changes  All other systems were reviewed with the patient and are negative.  I have reviewed the past medical history, past surgical history, social history and family history with the patient and they are unchanged from previous note.  ALLERGIES:  is allergic to ceftin [cefuroxime], crestor [rosuvastatin calcium], and latex.  MEDICATIONS:  Current Outpatient Medications  Medication Sig Dispense Refill  . Calcium Carb-Cholecalciferol (CALCIUM-VITAMIN D3) 600-400 MG-UNIT TABS Take 1 tablet by mouth daily.    Marland Kitchen CALCIUM PO Take by mouth. (Patient not taking: Reported on 03/16/2020)    . Cholecalciferol (VITAMIN D) 125 MCG (5000 UT) CAPS Take 5,000 Units by mouth daily.    Marland Kitchen dexamethasone (DECADRON) 4 MG tablet Take 2 tabs at the night before and 2 tab the morning of chemotherapy, every 3 weeks, by mouth x 6 cycles 36 tablet 6  . ibuprofen (ADVIL) 800 MG tablet Take 1 tablet (800 mg total) by mouth every 8 (eight) hours as needed for moderate pain. For AFTER surgery only 30 tablet 0  . Omega-3 Fatty Acids (FISH OIL PO) Take by mouth. (Patient not taking: Reported on 03/16/2020)    . ondansetron (ZOFRAN) 8 MG tablet Take 1 tablet (8 mg total) by mouth every 8 (eight) hours as needed. Start on the third day after chemotherapy. 30 tablet 1  . oxyCODONE (OXY IR/ROXICODONE) 5 MG immediate release tablet Take 1 tablet (5 mg total) by mouth every 4 (four) hours as needed for severe pain. For AFTER surgery, do not take and drive 10 tablet 0  . pantoprazole (PROTONIX) 40 MG tablet Take 40 mg by mouth daily as needed (acid reduction).     . prochlorperazine (COMPAZINE) 10 MG tablet Take 1 tablet (10 mg total) by mouth every 6 (six) hours as needed (Nausea or vomiting). 30 tablet 1  . senna-docusate (SENOKOT-S) 8.6-50 MG tablet Take 2 tablets by mouth at bedtime. For AFTER surgery, do  not take if having diarrhea 30 tablet 0  . VITAMIN E PO Take by mouth. (Patient not taking: Reported on 03/16/2020)     No current facility-administered medications for this visit.    PHYSICAL EXAMINATION: ECOG PERFORMANCE STATUS: 1 - Symptomatic but completely ambulatory  Vitals:   04/24/20 1350  BP: 107/66  Pulse: 66  Resp: 18  Temp: (!) 97 F (36.1 C)  SpO2: 100%   Filed Weights   04/24/20 1350  Weight: 139 lb 9.6 oz (63.3 kg)    GENERAL:alert, no distress and comfortable SKIN: skin color, texture, turgor are normal, no rashes or significant lesions EYES: normal, Conjunctiva are pink and non-injected, sclera clear OROPHARYNX:no exudate, no erythema and lips, buccal mucosa, and tongue normal  NECK: supple, thyroid normal size, non-tender, without nodularity LYMPH:  no palpable lymphadenopathy in the cervical, axillary or inguinal LUNGS: clear to auscultation and percussion with normal breathing effort HEART: regular rate & rhythm and no murmurs and no lower extremity edema ABDOMEN:abdomen  soft, non-tender and normal bowel sounds.  Noted well-healed surgical scars Musculoskeletal:no cyanosis of digits and no clubbing  NEURO: alert & oriented x 3 with fluent speech, no focal motor/sensory deficits  LABORATORY DATA:  I have reviewed the data as listed    Component Value Date/Time   NA 141 04/24/2020 1337   K 4.3 04/24/2020 1337   CL 105 04/24/2020 1337   CO2 27 04/24/2020 1337   GLUCOSE 101 (H) 04/24/2020 1337   BUN 12 04/24/2020 1337   CREATININE 0.88 04/24/2020 1337   CREATININE 0.89 12/04/2015 0901   CALCIUM 10.2 04/24/2020 1337   PROT 7.2 04/24/2020 1337   ALBUMIN 4.3 04/24/2020 1337   AST 14 (L) 04/24/2020 1337   ALT 11 04/24/2020 1337   ALKPHOS 69 04/24/2020 1337   BILITOT 0.6 04/24/2020 1337   GFRNONAA >60 04/24/2020 1337   GFRAA >60 04/24/2020 1337    No results found for: SPEP, UPEP  Lab Results  Component Value Date   WBC 6.1 04/24/2020    NEUTROABS 3.9 04/24/2020   HGB 12.6 04/24/2020   HCT 38.3 04/24/2020   MCV 84.0 04/24/2020   PLT 264 04/24/2020      Chemistry      Component Value Date/Time   NA 141 04/24/2020 1337   K 4.3 04/24/2020 1337   CL 105 04/24/2020 1337   CO2 27 04/24/2020 1337   BUN 12 04/24/2020 1337   CREATININE 0.88 04/24/2020 1337   CREATININE 0.89 12/04/2015 0901      Component Value Date/Time   CALCIUM 10.2 04/24/2020 1337   ALKPHOS 69 04/24/2020 1337   AST 14 (L) 04/24/2020 1337   ALT 11 04/24/2020 1337   BILITOT 0.6 04/24/2020 1337       RADIOGRAPHIC STUDIES: I have reviewed CT imaging with her and her sister extensively I have personally reviewed the radiological images as listed and agreed with the findings in the report. CT CHEST ABDOMEN PELVIS W CONTRAST  Result Date: 04/23/2020 CLINICAL DATA:  Stage III uterine cancer EXAM: CT CHEST, ABDOMEN, AND PELVIS WITH CONTRAST TECHNIQUE: Multidetector CT imaging of the chest, abdomen and pelvis was performed following the standard protocol during bolus administration of intravenous contrast. CONTRAST:  168m OMNIPAQUE IOHEXOL 300 MG/ML  SOLN COMPARISON:  Pelvic ultrasound dated 03/21/2020. CT chest dated 04/14/2017. FINDINGS: CT CHEST FINDINGS Cardiovascular: The heart is normal in size. No pericardial effusion. No evidence of thoracic aortic aneurysm. Mediastinum/Nodes: No suspicious mediastinal lymphadenopathy. Visualized thyroid is unremarkable. Lungs/Pleura: Mild biapical pleural-parenchymal scarring. No focal consolidation. No suspicious pulmonary nodules. No pleural effusion or pneumothorax. Musculoskeletal: Visualized osseous structures are within normal limits. CT ABDOMEN PELVIS FINDINGS Hepatobiliary: Liver is within normal limits. Gallbladder is unremarkable. No intrahepatic or extrahepatic ductal dilatation. Pancreas: Within normal limits. Spleen: Within normal limits. Adrenals/Urinary Tract: Adrenal glands are within normal limits. Kidneys  are within normal limits.  No hydronephrosis. Bladder is mildly thick-walled although underdistended. Stomach/Bowel: Stomach is within normal limits. No evidence of bowel obstruction. Normal appendix (series 2/image 106). Vascular/Lymphatic: No evidence of abdominal aortic aneurysm. No suspicious abdominopelvic lymphadenopathy. Reproductive: Status post hysterectomy and bilateral salpingo oophorectomy. Other: No abdominopelvic ascites. Mild stranding along the anterior transverse mesocolon (series 2/image 87). No frank peritoneal nodularity/omental caking. Musculoskeletal: Mild degenerative changes of the lumbar spine. IMPRESSION: Status post hysterectomy and bilateral salpingo-oophorectomy. Mild stranding along the anterior transverse mesocolon. No frank peritoneal nodularity or omental caking. Attention on follow-up is suggested. No findings specific for recurrent or metastatic disease. Bladder is mildly  thick-walled although underdistended. Electronically Signed   By: Julian Hy M.D.   On: 04/23/2020 16:23

## 2020-04-24 NOTE — Assessment & Plan Note (Signed)
She has symptoms of chronic dysuria and abnormal discharge even after surgery We reviewed her CT imaging extensively There is some bladder wall thickening However, this is not indicated if of bladder involvement from cancer We discussed the role of cystoscopy for further evaluation I recommend we hold off until after at least 1 cycle of treatment to see if her symptoms will improve further We also discussed the role of pain management

## 2020-04-24 NOTE — Assessment & Plan Note (Signed)
I have reviewed CT imaging with the patient I have reviewed her surgical report with the patient and her sister At this point in time, I have no proof that she has bladder invasion causing her urinary symptoms We discussed the role of adjuvant treatment and we will proceed as scheduled The patient does not want hair preservation and we will modify her appointments She desired to work intermittently during treatment and I will try to accommodate to her needs

## 2020-04-27 ENCOUNTER — Inpatient Hospital Stay: Payer: 59

## 2020-04-27 ENCOUNTER — Other Ambulatory Visit: Payer: Self-pay

## 2020-04-27 ENCOUNTER — Ambulatory Visit (HOSPITAL_BASED_OUTPATIENT_CLINIC_OR_DEPARTMENT_OTHER): Payer: 59 | Admitting: Medical

## 2020-04-27 VITALS — BP 121/69 | HR 76 | Temp 98.2°F | Resp 16 | Ht 64.0 in | Wt 141.0 lb

## 2020-04-27 DIAGNOSIS — Z5111 Encounter for antineoplastic chemotherapy: Secondary | ICD-10-CM | POA: Diagnosis not present

## 2020-04-27 DIAGNOSIS — T80810A Extravasation of vesicant antineoplastic chemotherapy, initial encounter: Secondary | ICD-10-CM

## 2020-04-27 DIAGNOSIS — Z87440 Personal history of urinary (tract) infections: Secondary | ICD-10-CM | POA: Diagnosis not present

## 2020-04-27 DIAGNOSIS — R599 Enlarged lymph nodes, unspecified: Secondary | ICD-10-CM | POA: Diagnosis not present

## 2020-04-27 DIAGNOSIS — R300 Dysuria: Secondary | ICD-10-CM | POA: Diagnosis not present

## 2020-04-27 DIAGNOSIS — C541 Malignant neoplasm of endometrium: Secondary | ICD-10-CM

## 2020-04-27 DIAGNOSIS — Z90710 Acquired absence of both cervix and uterus: Secondary | ICD-10-CM | POA: Diagnosis not present

## 2020-04-27 DIAGNOSIS — Z8249 Family history of ischemic heart disease and other diseases of the circulatory system: Secondary | ICD-10-CM | POA: Diagnosis not present

## 2020-04-27 DIAGNOSIS — Z800 Family history of malignant neoplasm of digestive organs: Secondary | ICD-10-CM | POA: Diagnosis not present

## 2020-04-27 DIAGNOSIS — Z79899 Other long term (current) drug therapy: Secondary | ICD-10-CM | POA: Diagnosis not present

## 2020-04-27 MED ORDER — PALONOSETRON HCL INJECTION 0.25 MG/5ML
INTRAVENOUS | Status: AC
  Filled 2020-04-27: qty 5

## 2020-04-27 MED ORDER — FAMOTIDINE IN NACL 20-0.9 MG/50ML-% IV SOLN
20.0000 mg | Freq: Once | INTRAVENOUS | Status: AC | PRN
  Administered 2020-04-27: 20 mg via INTRAVENOUS

## 2020-04-27 MED ORDER — FAMOTIDINE IN NACL 20-0.9 MG/50ML-% IV SOLN
20.0000 mg | Freq: Once | INTRAVENOUS | Status: AC
  Administered 2020-04-27: 20 mg via INTRAVENOUS

## 2020-04-27 MED ORDER — SODIUM CHLORIDE 0.9 % IV SOLN
175.0000 mg/m2 | Freq: Once | INTRAVENOUS | Status: AC
  Administered 2020-04-27: 300 mg via INTRAVENOUS
  Filled 2020-04-27: qty 50

## 2020-04-27 MED ORDER — DIPHENHYDRAMINE HCL 50 MG/ML IJ SOLN
INTRAMUSCULAR | Status: AC
  Filled 2020-04-27: qty 1

## 2020-04-27 MED ORDER — DIPHENHYDRAMINE HCL 50 MG/ML IJ SOLN
50.0000 mg | Freq: Once | INTRAMUSCULAR | Status: AC
  Administered 2020-04-27: 50 mg via INTRAVENOUS

## 2020-04-27 MED ORDER — SODIUM CHLORIDE 0.9 % IV SOLN
10.0000 mg | Freq: Once | INTRAVENOUS | Status: AC
  Administered 2020-04-27: 10 mg via INTRAVENOUS
  Filled 2020-04-27: qty 10

## 2020-04-27 MED ORDER — SODIUM CHLORIDE 0.9 % IV SOLN
615.4550 mg | Freq: Once | INTRAVENOUS | Status: AC
  Administered 2020-04-27: 620 mg via INTRAVENOUS
  Filled 2020-04-27: qty 62

## 2020-04-27 MED ORDER — SODIUM CHLORIDE 0.9 % IV SOLN
Freq: Once | INTRAVENOUS | Status: DC | PRN
  Filled 2020-04-27: qty 250

## 2020-04-27 MED ORDER — FAMOTIDINE IN NACL 20-0.9 MG/50ML-% IV SOLN
INTRAVENOUS | Status: AC
  Filled 2020-04-27: qty 50

## 2020-04-27 MED ORDER — PALONOSETRON HCL INJECTION 0.25 MG/5ML
0.2500 mg | Freq: Once | INTRAVENOUS | Status: AC
  Administered 2020-04-27: 0.25 mg via INTRAVENOUS

## 2020-04-27 MED ORDER — SODIUM CHLORIDE 0.9 % IV SOLN
150.0000 mg | Freq: Once | INTRAVENOUS | Status: AC
  Administered 2020-04-27: 150 mg via INTRAVENOUS
  Filled 2020-04-27: qty 150

## 2020-04-27 MED ORDER — SODIUM CHLORIDE 0.9 % IV SOLN
Freq: Once | INTRAVENOUS | Status: AC
  Filled 2020-04-27: qty 250

## 2020-04-27 NOTE — Progress Notes (Signed)
Patient complained of swelling of lymph nodes prior to final titration of paclitaxel. Patient stated that she was able to swallow and was not experiencing throat tightness. Paclitaxel stopped and normal saline started. Sandi Mealy, PA-C called and came to bedside to assess patient. Received verbal orders for additional pepcid IVPB and to wait 10 minutes following infusion before restarting paclitaxel infusion. Infusion restarted at prior rate of 75 ml/hr and titrated to final dose of 100 ml/hr with no further reactions.

## 2020-04-27 NOTE — Progress Notes (Signed)
    DATE:  04/27/2020                                          X  CHEMO/IMMUNOTHERAPY REACTION           MD:  Dr. [name]   AGENT/BLOOD PRODUCT RECEIVING TODAY:              Taxol and carboplatin   AGENT/BLOOD PRODUCT RECEIVING IMMEDIATELY PRIOR TO REACTION:          Taxol   VS: BP:     103/77   P:       67       SPO2:       99%                  REACTION(S):           throat tightness   PREMEDS:      Benadryl 50 mg IV x 1, Pepcid 20 mg IV x 1, dexamethasone, [place], and Aloxi    INTERVENTION: Taxol was paused and the patient was given an additional dose of  Pepcid 20 mg IV x 1   Review of Systems  Review of Systems  Constitutional: Negative for chills, diaphoresis and fever.  HENT: Negative for trouble swallowing and voice change.        Throat tightness  Respiratory: Negative for cough, chest tightness, shortness of breath and wheezing.   Cardiovascular: Negative for chest pain and palpitations.  Gastrointestinal: Negative for abdominal pain, constipation, diarrhea, nausea and vomiting.  Musculoskeletal: Negative for back pain and myalgias.  Neurological: Negative for dizziness, light-headedness and headaches.     Physical Exam  Physical Exam Constitutional:      General: She is not in acute distress.    Appearance: She is not diaphoretic.  HENT:     Head: Normocephalic and atraumatic.  Cardiovascular:     Rate and Rhythm: Normal rate and regular rhythm.     Heart sounds: Normal heart sounds. No murmur heard.  No friction rub. No gallop.   Pulmonary:     Effort: Pulmonary effort is normal. No respiratory distress.     Breath sounds: Normal breath sounds. No wheezing or rales.  Skin:    General: Skin is warm and dry.     Findings: No erythema or rash.  Neurological:     Mental Status: She is alert.     OUTCOME:                The patient's symptoms abated after she was dosed with Pepcid 20 mg IV x 1. Taxol was restarted after 10 minutes and was completed. She  was able to then complete dosing with carboplatin without any additional issues of concern.   Sandi Mealy, MHS, PA-C

## 2020-04-27 NOTE — Patient Instructions (Signed)
[facility] Discharge Instructions for Patients Receiving Chemotherapy  Today you received the following chemotherapy agents: Paclitaxel and Carboplatin  To help prevent nausea and vomiting after your treatment, we encourage you to take your nausea medication as prescribed.    If you develop nausea and vomiting that is not controlled by your nausea medication, call the clinic.   BELOW ARE SYMPTOMS THAT SHOULD BE REPORTED IMMEDIATELY:  *FEVER GREATER THAN 100.5 F  *CHILLS WITH OR WITHOUT FEVER  NAUSEA AND VOMITING THAT IS NOT CONTROLLED WITH YOUR NAUSEA MEDICATION  *UNUSUAL SHORTNESS OF BREATH  *UNUSUAL BRUISING OR BLEEDING  TENDERNESS IN MOUTH AND THROAT WITH OR WITHOUT PRESENCE OF ULCERS  *URINARY PROBLEMS  *BOWEL PROBLEMS  UNUSUAL RASH Items with * indicate a potential emergency and should be followed up as soon as possible.  Feel free to call the clinic should you have any questions or concerns. The clinic phone number is [phone].  Please show the CHEMO ALERT CARD at check-in to the Emergency Department and triage nurse.  Paclitaxel injection What is this medicine? PACLITAXEL (PAK li TAX el) is a chemotherapy drug. It targets fast dividing cells, like cancer cells, and causes these cells to die. This medicine is used to treat ovarian cancer, breast cancer, lung cancer, Kaposi's sarcoma, and other cancers. This medicine may be used for other purposes; ask your health care provider or pharmacist if you have questions. COMMON BRAND NAME(S): Onxol, Taxol What should I tell my health care provider before I take this medicine? They need to know if you have any of these conditions:  history of irregular heartbeat  liver disease  low blood counts, like low white cell, platelet, or red cell counts  lung or breathing disease, like asthma  tingling of the fingers or toes, or other nerve disorder  an unusual or allergic reaction to paclitaxel, alcohol,  polyoxyethylated castor oil, other chemotherapy, other medicines, foods, dyes, or preservatives  pregnant or trying to get pregnant  breast-feeding How should I use this medicine? This drug is given as an infusion into a vein. It is administered in a hospital or clinic by a specially trained health care professional. Talk to your pediatrician regarding the use of this medicine in children. Special care may be needed. Overdosage: If you think you have taken too much of this medicine contact a poison control center or emergency room at once. NOTE: This medicine is only for you. Do not share this medicine with others. What if I miss a dose? It is important not to miss your dose. Call your doctor or health care professional if you are unable to keep an appointment. What may interact with this medicine? Do not take this medicine with any of the following medications:  disulfiram  metronidazole This medicine may also interact with the following medications:  antiviral medicines for hepatitis, HIV or AIDS  certain antibiotics like erythromycin and clarithromycin  certain medicines for fungal infections like ketoconazole and itraconazole  certain medicines for seizures like carbamazepine, phenobarbital, phenytoin  gemfibrozil  nefazodone  rifampin  St. John's wort This list may not describe all possible interactions. Give your health care provider a list of all the medicines, herbs, non-prescription drugs, or dietary supplements you use. Also tell them if you smoke, drink alcohol, or use illegal drugs. Some items may interact with your medicine. What should I watch for while using this medicine? Your condition will be monitored carefully while you are receiving this medicine. You will need   important blood work done while you are taking this medicine. This medicine can cause serious allergic reactions. To reduce your risk you will need to take other medicine(s) before treatment with this  medicine. If you experience allergic reactions like skin rash, itching or hives, swelling of the face, lips, or tongue, tell your doctor or health care professional right away. In some cases, you may be given additional medicines to help with side effects. Follow all directions for their use. This drug may make you feel generally unwell. This is not uncommon, as chemotherapy can affect healthy cells as well as cancer cells. Report any side effects. Continue your course of treatment even though you feel ill unless your doctor tells you to stop. Call your doctor or health care professional for advice if you get a fever, chills or sore throat, or other symptoms of a cold or flu. Do not treat yourself. This drug decreases your body's ability to fight infections. Try to avoid being around people who are sick. This medicine may increase your risk to bruise or bleed. Call your doctor or health care professional if you notice any unusual bleeding. Be careful brushing and flossing your teeth or using a toothpick because you may get an infection or bleed more easily. If you have any dental work done, tell your dentist you are receiving this medicine. Avoid taking products that contain aspirin, acetaminophen, ibuprofen, naproxen, or ketoprofen unless instructed by your doctor. These medicines may hide a fever. Do not become pregnant while taking this medicine. Women should inform their doctor if they wish to become pregnant or think they might be pregnant. There is a potential for serious side effects to an unborn child. Talk to your health care professional or pharmacist for more information. Do not breast-feed an infant while taking this medicine. Men are advised not to father a child while receiving this medicine. This product may contain alcohol. Ask your pharmacist or healthcare provider if this medicine contains alcohol. Be sure to tell all healthcare providers you are taking this medicine. Certain medicines,  like metronidazole and disulfiram, can cause an unpleasant reaction when taken with alcohol. The reaction includes flushing, headache, nausea, vomiting, sweating, and increased thirst. The reaction can last from 30 minutes to several hours. What side effects may I notice from receiving this medicine? Side effects that you should report to your doctor or health care professional as soon as possible:  allergic reactions like skin rash, itching or hives, swelling of the face, lips, or tongue  breathing problems  changes in vision  fast, irregular heartbeat  high or low blood pressure  mouth sores  pain, tingling, numbness in the hands or feet  signs of decreased platelets or bleeding - bruising, pinpoint red spots on the skin, black, tarry stools, blood in the urine  signs of decreased red blood cells - unusually weak or tired, feeling faint or lightheaded, falls  signs of infection - fever or chills, cough, sore throat, pain or difficulty passing urine  signs and symptoms of liver injury like dark yellow or brown urine; general ill feeling or flu-like symptoms; light-colored stools; loss of appetite; nausea; right upper belly pain; unusually weak or tired; yellowing of the eyes or skin  swelling of the ankles, feet, hands  unusually slow heartbeat Side effects that usually do not require medical attention (report to your doctor or health care professional if they continue or are bothersome):  diarrhea  hair loss  loss of appetite  muscle or   joint pain  nausea, vomiting  pain, redness, or irritation at site where injected  tiredness This list may not describe all possible side effects. Call your doctor for medical advice about side effects. You may report side effects to FDA at 1-800-FDA-1088. Where should I keep my medicine? This drug is given in a hospital or clinic and will not be stored at home. NOTE: This sheet is a summary. It may not cover all possible information.  If you have questions about this medicine, talk to your doctor, pharmacist, or health care provider.  2020 Elsevier/Gold Standard (2017-04-28 13:14:55)  Carboplatin injection What is this medicine? CARBOPLATIN (KAR boe pla tin) is a chemotherapy drug. It targets fast dividing cells, like cancer cells, and causes these cells to die. This medicine is used to treat ovarian cancer and many other cancers. This medicine may be used for other purposes; ask your health care provider or pharmacist if you have questions. COMMON BRAND NAME(S): Paraplatin What should I tell my health care provider before I take this medicine? They need to know if you have any of these conditions:  blood disorders  hearing problems  kidney disease  recent or ongoing radiation therapy  an unusual or allergic reaction to carboplatin, cisplatin, other chemotherapy, other medicines, foods, dyes, or preservatives  pregnant or trying to get pregnant  breast-feeding How should I use this medicine? This drug is usually given as an infusion into a vein. It is administered in a hospital or clinic by a specially trained health care professional. Talk to your pediatrician regarding the use of this medicine in children. Special care may be needed. Overdosage: If you think you have taken too much of this medicine contact a poison control center or emergency room at once. NOTE: This medicine is only for you. Do not share this medicine with others. What if I miss a dose? It is important not to miss a dose. Call your doctor or health care professional if you are unable to keep an appointment. What may interact with this medicine?  medicines for seizures  medicines to increase blood counts like filgrastim, pegfilgrastim, sargramostim  some antibiotics like amikacin, gentamicin, neomycin, streptomycin, tobramycin  vaccines Talk to your doctor or health care professional before taking any of these  medicines:  acetaminophen  aspirin  ibuprofen  ketoprofen  naproxen This list may not describe all possible interactions. Give your health care provider a list of all the medicines, herbs, non-prescription drugs, or dietary supplements you use. Also tell them if you smoke, drink alcohol, or use illegal drugs. Some items may interact with your medicine. What should I watch for while using this medicine? Your condition will be monitored carefully while you are receiving this medicine. You will need important blood work done while you are taking this medicine. This drug may make you feel generally unwell. This is not uncommon, as chemotherapy can affect healthy cells as well as cancer cells. Report any side effects. Continue your course of treatment even though you feel ill unless your doctor tells you to stop. In some cases, you may be given additional medicines to help with side effects. Follow all directions for their use. Call your doctor or health care professional for advice if you get a fever, chills or sore throat, or other symptoms of a cold or flu. Do not treat yourself. This drug decreases your body's ability to fight infections. Try to avoid being around people who are sick. This medicine may increase your risk to   bruise or bleed. Call your doctor or health care professional if you notice any unusual bleeding. Be careful brushing and flossing your teeth or using a toothpick because you may get an infection or bleed more easily. If you have any dental work done, tell your dentist you are receiving this medicine. Avoid taking products that contain aspirin, acetaminophen, ibuprofen, naproxen, or ketoprofen unless instructed by your doctor. These medicines may hide a fever. Do not become pregnant while taking this medicine. Women should inform their doctor if they wish to become pregnant or think they might be pregnant. There is a potential for serious side effects to an unborn child. Talk  to your health care professional or pharmacist for more information. Do not breast-feed an infant while taking this medicine. What side effects may I notice from receiving this medicine? Side effects that you should report to your doctor or health care professional as soon as possible:  allergic reactions like skin rash, itching or hives, swelling of the face, lips, or tongue  signs of infection - fever or chills, cough, sore throat, pain or difficulty passing urine  signs of decreased platelets or bleeding - bruising, pinpoint red spots on the skin, black, tarry stools, nosebleeds  signs of decreased red blood cells - unusually weak or tired, fainting spells, lightheadedness  breathing problems  changes in hearing  changes in vision  chest pain  high blood pressure  low blood counts - This drug may decrease the number of white blood cells, red blood cells and platelets. You may be at increased risk for infections and bleeding.  nausea and vomiting  pain, swelling, redness or irritation at the injection site  pain, tingling, numbness in the hands or feet  problems with balance, talking, walking  trouble passing urine or change in the amount of urine Side effects that usually do not require medical attention (report to your doctor or health care professional if they continue or are bothersome):  hair loss  loss of appetite  metallic taste in the mouth or changes in taste This list may not describe all possible side effects. Call your doctor for medical advice about side effects. You may report side effects to FDA at 1-800-FDA-1088. Where should I keep my medicine? This drug is given in a hospital or clinic and will not be stored at home. NOTE: This sheet is a summary. It may not cover all possible information. If you have questions about this medicine, talk to your doctor, pharmacist, or health care provider.  2020 Elsevier/Gold Standard (2007-11-30 14:38:05)   

## 2020-04-30 ENCOUNTER — Telehealth: Payer: Self-pay | Admitting: Oncology

## 2020-04-30 NOTE — Telephone Encounter (Signed)
Called Emily [name] back and advised her that we have sent a scheduling message to have her appointments moved from 9/14 to 9/10 and to expect a call from the schedulers with the new times.

## 2020-04-30 NOTE — Telephone Encounter (Signed)
Yes, can you move all her appt? Thanks

## 2020-04-30 NOTE — Telephone Encounter (Signed)
Emily [name] left a message and asked if she can move her appointment on 05/22/20 (lab, follow up and infusion) to 05/18/20.  She said she would like to fit in 3 cycles of chemo before deciding about going back to work.

## 2020-05-01 ENCOUNTER — Telehealth: Payer: Self-pay | Admitting: *Deleted

## 2020-05-01 NOTE — Telephone Encounter (Signed)
-----   Message from Jolaine Click, RN sent at 04/27/2020  9:21 AM EDT ----- Regarding: First Time Paclitaxel and Carboplatin - Dr. Alvy Bimler Patient First Time Paclitaxel and Carboplatin - Dr. Alvy Bimler Patient

## 2020-05-01 NOTE — Telephone Encounter (Signed)
Called pt to see how she did with her treatment last week.  She reports doing OK, Nausea is better but still having some throat irritation & taking Protonix but trying to avoid b/c it makes her dizzy.  She is taking colace for bowels.  She has occ sharp pains in urethra. She knows that she can call with any questions. Note routed to Dr Marlinda Mike

## 2020-05-03 ENCOUNTER — Telehealth: Payer: Self-pay | Admitting: Oncology

## 2020-05-03 ENCOUNTER — Encounter: Payer: Self-pay | Admitting: Hematology and Oncology

## 2020-05-03 NOTE — Telephone Encounter (Signed)
Briarrose called and said she needs her FMLA paperwork sent to [employer] before 8/31 or she will have to go back to work.  Advised her that it will be done tomorrow per Roz, RN and that I will call her when it is finished.

## 2020-05-04 ENCOUNTER — Telehealth: Payer: Self-pay | Admitting: *Deleted

## 2020-05-04 NOTE — Telephone Encounter (Signed)
Called Emily [name] back and advised her that we have faxed her FMLA paperwork to [employer].

## 2020-05-04 NOTE — Telephone Encounter (Signed)
Emily [name] [phone]) lives with her sister Erven Colla who works for a [employer] with several positive Covid Delta cases.   Vickie "needs a letter for her director Dr. Loura Back.  Working from home has been granted however a statement from oncologist indicating need to support Vollie's immune system with chemotherapy."  Notifying provider and collaborative to assist with this request.

## 2020-05-07 ENCOUNTER — Telehealth: Payer: Self-pay | Admitting: Oncology

## 2020-05-07 ENCOUNTER — Telehealth: Payer: Self-pay | Admitting: *Deleted

## 2020-05-07 NOTE — Telephone Encounter (Signed)
Reached patient notifying her form for [employer] has been re-faxed and emailed.  Conformation and information emailed to patient.

## 2020-05-07 NOTE — Telephone Encounter (Signed)
Emily [name] left 2 messages regarding her FMLA paperwork.  She said [employer] did not receive the paperwork on Friday and it needs to be faxed to [phone] as soon as possible. She would also like it emailed to her at [email or web address].

## 2020-05-17 ENCOUNTER — Other Ambulatory Visit: Payer: 59

## 2020-05-17 ENCOUNTER — Ambulatory Visit: Payer: 59 | Admitting: Hematology and Oncology

## 2020-05-18 ENCOUNTER — Inpatient Hospital Stay (HOSPITAL_BASED_OUTPATIENT_CLINIC_OR_DEPARTMENT_OTHER): Payer: 59 | Admitting: Hematology and Oncology

## 2020-05-18 ENCOUNTER — Inpatient Hospital Stay: Payer: 59 | Attending: Gynecologic Oncology

## 2020-05-18 ENCOUNTER — Encounter: Payer: Self-pay | Admitting: Hematology and Oncology

## 2020-05-18 ENCOUNTER — Inpatient Hospital Stay: Payer: 59

## 2020-05-18 ENCOUNTER — Other Ambulatory Visit: Payer: Self-pay

## 2020-05-18 ENCOUNTER — Ambulatory Visit: Payer: 59

## 2020-05-18 DIAGNOSIS — G620 Drug-induced polyneuropathy: Secondary | ICD-10-CM | POA: Diagnosis not present

## 2020-05-18 DIAGNOSIS — Z90722 Acquired absence of ovaries, bilateral: Secondary | ICD-10-CM | POA: Insufficient documentation

## 2020-05-18 DIAGNOSIS — Z8261 Family history of arthritis: Secondary | ICD-10-CM | POA: Diagnosis not present

## 2020-05-18 DIAGNOSIS — Z9079 Acquired absence of other genital organ(s): Secondary | ICD-10-CM | POA: Diagnosis not present

## 2020-05-18 DIAGNOSIS — C541 Malignant neoplasm of endometrium: Secondary | ICD-10-CM

## 2020-05-18 DIAGNOSIS — Z79899 Other long term (current) drug therapy: Secondary | ICD-10-CM | POA: Diagnosis not present

## 2020-05-18 DIAGNOSIS — K219 Gastro-esophageal reflux disease without esophagitis: Secondary | ICD-10-CM | POA: Diagnosis not present

## 2020-05-18 DIAGNOSIS — T451X5A Adverse effect of antineoplastic and immunosuppressive drugs, initial encounter: Secondary | ICD-10-CM | POA: Insufficient documentation

## 2020-05-18 DIAGNOSIS — G2581 Restless legs syndrome: Secondary | ICD-10-CM | POA: Insufficient documentation

## 2020-05-18 DIAGNOSIS — M8580 Other specified disorders of bone density and structure, unspecified site: Secondary | ICD-10-CM | POA: Diagnosis not present

## 2020-05-18 DIAGNOSIS — Z5111 Encounter for antineoplastic chemotherapy: Secondary | ICD-10-CM | POA: Insufficient documentation

## 2020-05-18 DIAGNOSIS — E7800 Pure hypercholesterolemia, unspecified: Secondary | ICD-10-CM | POA: Diagnosis not present

## 2020-05-18 DIAGNOSIS — Z90710 Acquired absence of both cervix and uterus: Secondary | ICD-10-CM | POA: Insufficient documentation

## 2020-05-18 DIAGNOSIS — E785 Hyperlipidemia, unspecified: Secondary | ICD-10-CM | POA: Diagnosis not present

## 2020-05-18 DIAGNOSIS — Z833 Family history of diabetes mellitus: Secondary | ICD-10-CM | POA: Insufficient documentation

## 2020-05-18 DIAGNOSIS — Z23 Encounter for immunization: Secondary | ICD-10-CM | POA: Diagnosis not present

## 2020-05-18 DIAGNOSIS — Z8249 Family history of ischemic heart disease and other diseases of the circulatory system: Secondary | ICD-10-CM | POA: Diagnosis not present

## 2020-05-18 DIAGNOSIS — Z7952 Long term (current) use of systemic steroids: Secondary | ICD-10-CM | POA: Insufficient documentation

## 2020-05-18 LAB — CBC WITH DIFFERENTIAL (CANCER CENTER ONLY)
Abs Immature Granulocytes: 0.06 10*3/uL (ref 0.00–0.07)
Basophils Absolute: 0 10*3/uL (ref 0.0–0.1)
Basophils Relative: 0 %
Eosinophils Absolute: 0 10*3/uL (ref 0.0–0.5)
Eosinophils Relative: 0 %
HCT: 36.8 % (ref 36.0–46.0)
Hemoglobin: 12.2 g/dL (ref 12.0–15.0)
Immature Granulocytes: 1 %
Lymphocytes Relative: 22 %
Lymphs Abs: 2.4 10*3/uL (ref 0.7–4.0)
MCH: 28.1 pg (ref 26.0–34.0)
MCHC: 33.2 g/dL (ref 30.0–36.0)
MCV: 84.8 fL (ref 80.0–100.0)
Monocytes Absolute: 0.8 10*3/uL (ref 0.1–1.0)
Monocytes Relative: 7 %
Neutro Abs: 8 10*3/uL — ABNORMAL HIGH (ref 1.7–7.7)
Neutrophils Relative %: 70 %
Platelet Count: 234 10*3/uL (ref 150–400)
RBC: 4.34 MIL/uL (ref 3.87–5.11)
RDW: 13.9 % (ref 11.5–15.5)
WBC Count: 11.3 10*3/uL — ABNORMAL HIGH (ref 4.0–10.5)
nRBC: 0 % (ref 0.0–0.2)

## 2020-05-18 LAB — CMP (CANCER CENTER ONLY)
ALT: 17 U/L (ref 0–44)
AST: 19 U/L (ref 15–41)
Albumin: 4.3 g/dL (ref 3.5–5.0)
Alkaline Phosphatase: 82 U/L (ref 38–126)
Anion gap: 9 (ref 5–15)
BUN: 20 mg/dL (ref 6–20)
CO2: 24 mmol/L (ref 22–32)
Calcium: 9.5 mg/dL (ref 8.9–10.3)
Chloride: 105 mmol/L (ref 98–111)
Creatinine: 0.71 mg/dL (ref 0.44–1.00)
GFR, Est AFR Am: >60 mL/min (ref >60)
GFR, Estimated: >60 mL/min (ref >60)
Glucose, Bld: 98 mg/dL (ref 70–99)
Potassium: 3.7 mmol/L (ref 3.5–5.1)
Sodium: 138 mmol/L (ref 135–145)
Total Bilirubin: 0.2 mg/dL — ABNORMAL LOW (ref 0.3–1.2)
Total Protein: 7.5 g/dL (ref 6.5–8.1)

## 2020-05-18 MED ORDER — HEPARIN SOD (PORK) LOCK FLUSH 100 UNIT/ML IV SOLN
500.0000 [IU] | Freq: Once | INTRAVENOUS | Status: DC | PRN
  Filled 2020-05-18: qty 5

## 2020-05-18 MED ORDER — SODIUM CHLORIDE 0.9 % IV SOLN
Freq: Once | INTRAVENOUS | Status: AC
  Filled 2020-05-18: qty 250

## 2020-05-18 MED ORDER — DIPHENHYDRAMINE HCL 50 MG/ML IJ SOLN
INTRAMUSCULAR | Status: AC
  Filled 2020-05-18: qty 1

## 2020-05-18 MED ORDER — SODIUM CHLORIDE 0.9 % IV SOLN
150.0000 mg | Freq: Once | INTRAVENOUS | Status: AC
  Administered 2020-05-18: 150 mg via INTRAVENOUS
  Filled 2020-05-18: qty 150

## 2020-05-18 MED ORDER — FAMOTIDINE IN NACL 20-0.9 MG/50ML-% IV SOLN: 20.0000 mg | Freq: Once | INTRAVENOUS | Status: DC

## 2020-05-18 MED ORDER — SODIUM CHLORIDE 0.9 % IV SOLN
615.4550 mg | Freq: Once | INTRAVENOUS | Status: AC
  Administered 2020-05-18: 620 mg via INTRAVENOUS
  Filled 2020-05-18: qty 62

## 2020-05-18 MED ORDER — SODIUM CHLORIDE 0.9 % IV SOLN
40.0000 mg | Freq: Once | INTRAVENOUS | Status: AC
  Administered 2020-05-18: 40 mg via INTRAVENOUS
  Filled 2020-05-18: qty 4

## 2020-05-18 MED ORDER — PALONOSETRON HCL INJECTION 0.25 MG/5ML
0.2500 mg | Freq: Once | INTRAVENOUS | Status: AC
  Administered 2020-05-18: 0.25 mg via INTRAVENOUS

## 2020-05-18 MED ORDER — SODIUM CHLORIDE 0.9 % IV SOLN
10.0000 mg | Freq: Once | INTRAVENOUS | Status: AC
  Administered 2020-05-18: 10 mg via INTRAVENOUS
  Filled 2020-05-18: qty 10

## 2020-05-18 MED ORDER — FAMOTIDINE IN NACL 20-0.9 MG/50ML-% IV SOLN
INTRAVENOUS | Status: AC
  Filled 2020-05-18: qty 50

## 2020-05-18 MED ORDER — SODIUM CHLORIDE 0.9 % IV SOLN
175.0000 mg/m2 | Freq: Once | INTRAVENOUS | Status: AC
  Administered 2020-05-18: 300 mg via INTRAVENOUS
  Filled 2020-05-18: qty 50

## 2020-05-18 MED ORDER — DIPHENHYDRAMINE HCL 50 MG/ML IJ SOLN
12.5000 mg | Freq: Once | INTRAMUSCULAR | Status: AC
  Administered 2020-05-18: 12.5 mg via INTRAVENOUS

## 2020-05-18 MED ORDER — PALONOSETRON HCL INJECTION 0.25 MG/5ML
INTRAVENOUS | Status: AC
  Filled 2020-05-18: qty 5

## 2020-05-18 MED ORDER — SODIUM CHLORIDE 0.9% FLUSH
10.0000 mL | INTRAVENOUS | Status: DC | PRN
  Filled 2020-05-18: qty 10

## 2020-05-18 NOTE — Assessment & Plan Note (Signed)
I plan to reduce premedication Benadryl to alleviate some of her symptoms

## 2020-05-18 NOTE — Assessment & Plan Note (Signed)
Overall, she tolerated treatment well with expected side effects such as fatigue, occasional nausea and memory problems The discharge near her urethra has improved She is concerned about radiation next week and I have confirmed with her GYN surgeon to proceed with appointment and schedule for further examination before radiation begins

## 2020-05-18 NOTE — Progress Notes (Signed)
Patient had some vein irritation with starting carboplatin. Normal saline added behind carboplatin at 627ml/hr and a heating pack, and began to resolve.

## 2020-05-18 NOTE — Progress Notes (Signed)
Increased Pepcid to 40 mg IV per Dr. Alvy Bimler. Pt had throat tightness w/ 1st dose Taxol. Will monitor pt today for further dose adjustments to Pepcid.  Kennith Center, Pharm.D., CPP 05/18/2020@11 :54 AM

## 2020-05-18 NOTE — Progress Notes (Signed)
[place] OFFICE PROGRESS NOTE  Patient Care Team: [name], MD as PCP - General (Family Medicine)  ASSESSMENT & PLAN:  Endometrial cancer ([place]) Overall, she tolerated treatment well with expected side effects such as fatigue, occasional nausea and memory problems The discharge near her urethra has improved She is concerned about radiation next week and I have confirmed with her GYN surgeon to proceed with appointment and schedule for further examination before radiation begins  Restless leg I plan to reduce premedication Benadryl to alleviate some of her symptoms  Peripheral neuropathy due to chemotherapy [facility]) she has mild peripheral neuropathy, likely related to side effects of treatment. It is only mild, not bothering the patient. I will observe for now If it gets worse in the future, I will consider modifying the dose of the treatment    No orders of the defined types were placed in this encounter.   All questions were answered. The patient knows to call the clinic with any problems, questions or concerns. The total time spent in the appointment was 20 minutes encounter with patients including review of chart and various tests results, discussions about plan of care and coordination of care plan   [name], MD 05/18/2020 10:41 AM  INTERVAL HISTORY: Please see below for problem oriented charting. She returns with her sister for further follow-up, to be seen prior to cycle 2 of therapy She has some minor bruising after chemo She has some nausea, fatigue and memory difficulties but those have improved She had very mild intermittent numbness at the bottom of her feet She still concerned about radiation treatment next week due to persistent discharge from the urethra although it has improved She is concerned about recent weight gain She had restless leg after treatment  SUMMARY OF ONCOLOGIC HISTORY: Oncology History Overview Note  IHC MMR  normal Endometrioid MSI-stable   Endometrial cancer ([place])  02/28/2020 Initial Diagnosis   The patient presented for an exam on 6/22.  At that time she endorsed episodes of postmenopausal bleeding starting in 08/20/2019 after her mother's death.  She had not had a menses in a number of years.  Patient's exam was limited by her intolerance in the position of the cervix.  Pap test was performed on 6/30 showing atypical glandular cells of undetermined significance. 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 She describes about a year long history previously of clear vaginal discharge. She saw her PCP for this complaint and given what was felt to be urethral swelling, she was treated for a UTI (lab testing did not show an infection). She has not been sexually active since her 53s. She endorses increased urinary frequency. After a year, the clear discharge became yellow (and at one point looked green). The patient was seen again and a pap was attempted in the office but she was unable to tolerate this. She performed a vaginal swab on herself that found yeast. She used Monistat multiple times with improvement in her discharge. She then began to have pale pink discharge. At this time, she was seen by a Urogynecologist at [facility] and it sound like she had urodynamic testing. She describes a raw feeling and burning with her urine. She continues to have vaginal discharge which she notes has an ammonia-like smell. Her vaginal bleeding started again about a month ago. She briefly tried vaginal estrogen after seeing the Urogynecologist but she felt that the bleeding worsened as did the ammonia smell, so  she stopped.   03/16/2020 Initial Diagnosis   Endometrial cancer ([place])   03/21/2020 Imaging   1. Abnormally thickened endometrial complex measuring up to 12 mm, presumably related to known history of endometrial carcinoma. 2. Underlying fibroid uterus as detailed  above. 3. Normal sonographic evaluation of the ovaries. No abnormal free fluid. Please note that the appendix appears to be potentially located in close proximity to the right ovary on this exam. As this patient is scheduled to undergo total hysterectomy with bilateral salpingo oophorectomy in the near future, close attention to this region at surgical intervention is recommended.     03/27/2020 Pathology Results   A. CUL DE SAC, ANTERIOR, BIOPSY:  -  Benign fibrovascular tissue with mesothelial hyperplasia  -  No carcinoma identified  -  See comment   B. MONS, RIGHT, BIOPSY:  -  Melanocytic nevus, intradermal type  -  See comment   C. LYMPH NODE, SENTINEL, RIGHT EXTERNAL ILIAC, BIOPSY:  -  No carcinoma identified in one lymph node (0/1)  -  See comment   D. PERITONEUM, LEFT SIDEWALL, BIOPSY:  -  Benign fibrovascular tissue with chronic inflammation  -  No carcinoma identified   E. LYMPH NODE, SENTINEL, LEFT EXTERNAL ILIAC, BIOPSY:  -  No carcinoma identified in one lymph node (0/1)  -  See comment   F. UTERUS, CERVIX, BILATERAL FALLOPIAN TUBES AND OVARIES:   Uterus:  -  Endometrioid carcinoma, FIGO grade 2  -  Leiomyomata (1.6 cm ; largest)  -  Carcinoma present in lymphoid aggregate in serosal adhesions  (lymphovascular space invasion)  -  See oncology table and comment below   Cervix:  -  No carcinoma identified   Bilateral Ovaries:  -  No carcinoma identified   Bilateral Fallopian tubes:  -  Carcinoma within lumen of fallopian tube  (left)   G. PERIURETHRAL, BIOPSY:  -  No carcinoma identified    03/27/2020 Surgery   Robotic-assisted laparoscopic total hysterectomy with bilateral salpingoophorectomy, SLN biopsy   On EUA, very narrow introitus most due to tight hymenal ring. Small mobile uterus. Hyperpigmented 0.75m plaque on right mons, biopsied. Urethral overall normal in appearance, given hyperemic appearance in clinic, biopsy taken. On intra-abdominal entry,  some scarring noted on inferior aspect of the liver, anterior right diaphragm and left lobe of the liver. Omentum normal appearing. Mesentery of the small and large bowel as well as the bowel itself studded with 1-242minflammatory-appearing nodules. Appendix with same studding, otherwise normal in appearance. Uterus 6cm and normal appearing with the exception of inflammatory exudate on posterior aspect of the fundus. Bilateral adnexa normal appearing. Chocolate brown staining within most of the cul-de-sac. Inflammatory appearing nodules studding bilateral pelvic walls, anterior and posterior cul-de-sac c/w endometriosis. No adenopathy. No intra-abdominal or pelvic evidence of disease.   03/2020 Initial Biopsy   EMB - gr 1-2 EMCA   03/27/2020 Cancer Staging   Staging form: Corpus Uteri - Carcinoma and Carcinosarcoma, AJCC 8th Edition - Clinical stage from 03/27/2020: FIGO Stage IIIA (cT3a, cN0(sn), cM0) - Signed by GoHeath LarkMD on 04/24/2020   04/23/2020 Imaging   Status post hysterectomy and bilateral salpingo-oophorectomy.   Mild stranding along the anterior transverse mesocolon. No frank peritoneal nodularity or omental caking. Attention on follow-up is suggested.   No findings specific for recurrent or metastatic disease.   Bladder is mildly thick-walled although underdistended.     04/27/2020 -  Chemotherapy   The patient had carboplatin and taxol for chemotherapy treatment.  REVIEW OF SYSTEMS:   Constitutional: Denies fevers, chills or abnormal weight loss Eyes: Denies blurriness of vision Ears, nose, mouth, throat, and face: Denies mucositis or sore throat Respiratory: Denies cough, dyspnea or wheezes Cardiovascular: Denies palpitation, chest discomfort or lower extremity swelling Skin: Denies abnormal skin rashes Lymphatics: Denies new lymphadenopathy or easy bruising Behavioral/Psych: Mood is stable, no new changes  All other systems were reviewed with the patient and are  negative.  I have reviewed the past medical history, past surgical history, social history and family history with the patient and they are unchanged from previous note.  ALLERGIES:  is allergic to ceftin [cefuroxime], crestor [rosuvastatin calcium], and latex.  MEDICATIONS:  Current Outpatient Medications  Medication Sig Dispense Refill  . Calcium Carb-Cholecalciferol (CALCIUM-VITAMIN D3) 600-400 MG-UNIT TABS Take 1 tablet by mouth daily.    Marland Kitchen CALCIUM PO Take by mouth. (Patient not taking: Reported on 03/16/2020)    . Cholecalciferol (VITAMIN D) 125 MCG (5000 UT) CAPS Take 5,000 Units by mouth daily.    Marland Kitchen dexamethasone (DECADRON) 4 MG tablet Take 2 tabs at the night before and 2 tab the morning of chemotherapy, every 3 weeks, by mouth x 6 cycles 36 tablet 6  . ibuprofen (ADVIL) 800 MG tablet Take 1 tablet (800 mg total) by mouth every 8 (eight) hours as needed for moderate pain. For AFTER surgery only 30 tablet 0  . Omega-3 Fatty Acids (FISH OIL PO) Take by mouth. (Patient not taking: Reported on 03/16/2020)    . ondansetron (ZOFRAN) 8 MG tablet Take 1 tablet (8 mg total) by mouth every 8 (eight) hours as needed. Start on the third day after chemotherapy. 30 tablet 1  . oxyCODONE (OXY IR/ROXICODONE) 5 MG immediate release tablet Take 1 tablet (5 mg total) by mouth every 4 (four) hours as needed for severe pain. For AFTER surgery, do not take and drive 10 tablet 0  . pantoprazole (PROTONIX) 40 MG tablet Take 40 mg by mouth daily as needed (acid reduction).     . prochlorperazine (COMPAZINE) 10 MG tablet Take 1 tablet (10 mg total) by mouth every 6 (six) hours as needed (Nausea or vomiting). 30 tablet 1  . senna-docusate (SENOKOT-S) 8.6-50 MG tablet Take 2 tablets by mouth at bedtime. For AFTER surgery, do not take if having diarrhea 30 tablet 0  . VITAMIN E PO Take by mouth. (Patient not taking: Reported on 03/16/2020)     No current facility-administered medications for this visit.    PHYSICAL  EXAMINATION: ECOG PERFORMANCE STATUS: 1 - Symptomatic but completely ambulatory  Vitals:   05/18/20 1003  BP: 129/68  Pulse: 68  Resp: 18  Temp: 97.6 F (36.4 C)  SpO2: 100%   Filed Weights   05/18/20 1003  Weight: 144 lb 3.2 oz (65.4 kg)    GENERAL:alert, no distress and comfortable SKIN: skin color, texture, turgor are normal, no rashes or significant lesions EYES: normal, Conjunctiva are pink and non-injected, sclera clear OROPHARYNX:no exudate, no erythema and lips, buccal mucosa, and tongue normal  NECK: supple, thyroid normal size, non-tender, without nodularity LYMPH:  no palpable lymphadenopathy in the cervical, axillary or inguinal LUNGS: clear to auscultation and percussion with normal breathing effort HEART: regular rate & rhythm and no murmurs and no lower extremity edema ABDOMEN:abdomen soft, non-tender and normal bowel sounds Musculoskeletal:no cyanosis of digits and no clubbing  NEURO: alert & oriented x 3 with fluent speech, no focal motor/sensory deficits  LABORATORY DATA:  I have reviewed the data  as listed    Component Value Date/Time   NA 141 04/24/2020 1337   K 4.3 04/24/2020 1337   CL 105 04/24/2020 1337   CO2 27 04/24/2020 1337   GLUCOSE 101 (H) 04/24/2020 1337   BUN 12 04/24/2020 1337   CREATININE 0.88 04/24/2020 1337   CREATININE 0.89 12/04/2015 0901   CALCIUM 10.2 04/24/2020 1337   PROT 7.2 04/24/2020 1337   ALBUMIN 4.3 04/24/2020 1337   AST 14 (L) 04/24/2020 1337   ALT 11 04/24/2020 1337   ALKPHOS 69 04/24/2020 1337   BILITOT 0.6 04/24/2020 1337   GFRNONAA >60 04/24/2020 1337   GFRAA >60 04/24/2020 1337    No results found for: SPEP, UPEP  Lab Results  Component Value Date   WBC 11.3 (H) 05/18/2020   NEUTROABS 8.0 (H) 05/18/2020   HGB 12.2 05/18/2020   HCT 36.8 05/18/2020   MCV 84.8 05/18/2020   PLT 234 05/18/2020      Chemistry      Component Value Date/Time   NA 141 04/24/2020 1337   K 4.3 04/24/2020 1337   CL 105  04/24/2020 1337   CO2 27 04/24/2020 1337   BUN 12 04/24/2020 1337   CREATININE 0.88 04/24/2020 1337   CREATININE 0.89 12/04/2015 0901      Component Value Date/Time   CALCIUM 10.2 04/24/2020 1337   ALKPHOS 69 04/24/2020 1337   AST 14 (L) 04/24/2020 1337   ALT 11 04/24/2020 1337   BILITOT 0.6 04/24/2020 1337

## 2020-05-18 NOTE — Assessment & Plan Note (Signed)
she has mild peripheral neuropathy, likely related to side effects of treatment. It is only mild, not bothering the patient. I will observe for now If it gets worse in the future, I will consider modifying the dose of the treatment  

## 2020-05-18 NOTE — Patient Instructions (Signed)
[facility] Discharge Instructions for Patients Receiving Chemotherapy  Today you received the following chemotherapy agents: Paclitaxel and Carboplatin  To help prevent nausea and vomiting after your treatment, we encourage you to take your nausea medication as prescribed.    If you develop nausea and vomiting that is not controlled by your nausea medication, call the clinic.   BELOW ARE SYMPTOMS THAT SHOULD BE REPORTED IMMEDIATELY:  *FEVER GREATER THAN 100.5 F  *CHILLS WITH OR WITHOUT FEVER  NAUSEA AND VOMITING THAT IS NOT CONTROLLED WITH YOUR NAUSEA MEDICATION  *UNUSUAL SHORTNESS OF BREATH  *UNUSUAL BRUISING OR BLEEDING  TENDERNESS IN MOUTH AND THROAT WITH OR WITHOUT PRESENCE OF ULCERS  *URINARY PROBLEMS  *BOWEL PROBLEMS  UNUSUAL RASH Items with * indicate a potential emergency and should be followed up as soon as possible.  Feel free to call the clinic should you have any questions or concerns. The clinic phone number is [phone].  Please show the CHEMO ALERT CARD at check-in to the Emergency Department and triage nurse.   

## 2020-05-21 ENCOUNTER — Telehealth: Payer: Self-pay | Admitting: *Deleted

## 2020-05-21 NOTE — Telephone Encounter (Signed)
Returned the patient's call and left a message with the injection appt date/time. Patient requested a COVID booster

## 2020-05-22 ENCOUNTER — Other Ambulatory Visit: Payer: 59

## 2020-05-22 ENCOUNTER — Ambulatory Visit: Payer: 59 | Admitting: Hematology and Oncology

## 2020-05-22 ENCOUNTER — Ambulatory Visit: Payer: 59

## 2020-05-22 NOTE — Progress Notes (Signed)
Radiation Oncology         [phone] ________________________________  Initial Outpatient Consultation  Name: Emily [name] MRN: [id number]  Date: 05/23/2020  DOB: 1961/03/23  ZJ:IRCV, Arbie Cookey, MD  [name], MD   REFERRING PHYSICIAN: [name], MD  DIAGNOSIS: The encounter diagnosis was Endometrial cancer [facility]).  Stage IIIA (pT3a, pN0) endometrioid endometrial adenocarcinoma, FIGO grade 2  HISTORY OF PRESENT ILLNESS::Emily [name] is a 59 y.o. female who is seen as a courtesy of Dr. Berline Lopes for an opinion concerning radiation therapy as part of management for her recently diagnosed endometrial cancer. Today, she is accompanied by her sister. The patient was seen by Dr. Paula Compton, OB-GYN at [facility], on 02/28/2020 for evaluation of eight-month history of intermittent postmenopausal bleeding. PAP smear was performed on 03/06/2020 and showed atypical glandular cells of undetermined significance. An endometrial biopsy was also performed and revealed grade 1-2 endometrioid adenocarcinoma.   Given the above findings, the patient was referred to Dr. Berline Lopes and was seen in consultation on 03/15/2020. The patient was considered to be a suitable candidate for a minimally invasive surgical staging. Since the exam was limited secondary to pain, a transabdominal ultrasound was recommended prior to surgery.  A transabdominal ultrasound of the pelvis was performed on 03/21/2020 and showed an abnormally thickened endometrial complex that measured up to 12 mm and was presumably related to the known endometrial carcinoma. There was also noted to be an underlying fibroid uterus. Sonographic evaluation of the ovaries was normal without abnormal free fluid.   The patient underwent a robotic-assisted laparoscopic total hysterectomy with bilateral salpingo-oophorectomy and sentinel lymph node biopsy on 03/27/2020 under the care of Dr. Berline Lopes. Pathology from the  procedure revealed FIGO grade 2 endometrioid adenocarcinoma of the uterus. There was also noted to be leiomyomata and carcinoma present in lymphoid aggregate in serosal adhesions (lymphovascular space invasion). No carcinoma was identified in the anterior cul-de-sac, right mons, single right external iliac sentinel lymph node, left sidewall of peritoneum, single left external iliac sentinel lymph node, cervix, ovaries, fallopian tubes, or periurethral biopsy.  The patient's path report was significant for left fallopian tube luminal involvement as well as lymphovascular space invasion, low (serosal adhesions and myometrium)  The patient's case was discussed at the gynecologic oncology multi-disciplinary conference on 04/16/2020. At that time, it was recommended that she proceed with adjuvant therapy with chemotherapy as well as vaginal brachytherapy to reduce chances for vaginal cuff recurrence.  The patient was referred to Dr. Alvy Bimler and was seen in consultation on 04/17/2020. It was recommended that she proceed with CT imaging for proper staging followed by chemotherapy with curative intent using Carboplatin and Taxol x6 cycles.  CT scan of chest, abdomen, and pelvis on 04/23/2020 showed mild stranding along the anterior transverse mesocolon. The bladder was mildly thick-walled but under-distended. There was no frank peritoneal nodularity or omental caking, nor were there any findings specific for recurrent or metastatic disease.  The patient began chemotherapy on 04/27/2020 and has tolerated it well so far with the exception of fatigue, occasional nausea, mild peripheral neuropathy, and memory problems.   PREVIOUS RADIATION THERAPY: No  PAST MEDICAL HISTORY:  Past Medical History:  Diagnosis Date  . Abnormal EKG 07/16/2015  . Anxiety and depression    pt denies 7/16/  . Bruises easily   . Chest pain   . Chest pain with moderate risk of acute coronary syndrome   . Chest tightness   .  Difficult intubation    throat feels small pt has acid reflux ? scars due to acid   . Elevated cholesterol   . Endometrial cancer ([place])   . Family history of premature CAD 30-Jul-2015   Father had MI 40's, died at 66 of an MI   . Fatigue   . Fibroid   . Fx sacrum/coccyx-closed ([place])   . Gastritis   . GERD (gastroesophageal reflux disease)   . Hyperlipidemia July 30, 2015  . Osteopenia   . Pigmented skin lesions   . Pre-diabetes   . Sinus problem   . SOB (shortness of breath) on exertion   . Urethra disorder    currently swollen per pt   . Vitamin D deficiency   . Weight gain     PAST SURGICAL HISTORY: Past Surgical History:  Procedure Laterality Date  . ADENOIDECTOMY    . COLONOSCOPY    . ROBOTIC ASSISTED TOTAL HYSTERECTOMY WITH BILATERAL SALPINGO OOPHERECTOMY Bilateral 03/27/2020   Procedure: XI ROBOTIC ASSISTED TOTAL HYSTERECTOMY WITH BILATERAL SALPINGO OOPHORECTOMY;  Surgeon: [name], MD;  Location: [facility];  Service: Gynecology;  Laterality: Bilateral;  . SENTINEL NODE BIOPSY N/A 03/27/2020   Procedure: SENTINEL NODE BIOPSY, URETHRAL BIOPSY AND VAGINAL CULTURE;  Surgeon: [name], MD;  Location: [facility];  Service: Gynecology;  Laterality: N/A;  . SMALL BOWEL ENTEROSCOPY    . TONSILLECTOMY      FAMILY HISTORY:  Family History  Problem Relation Age of Onset  . Atrial fibrillation Mother   . Hypertension Mother   . Stroke Mother   . Arthritis Mother   . Atrial fibrillation Father   . Heart attack Father 73  . Hypertension Father   . COPD Father   . Diabetes Father   . Colon cancer Paternal Grandmother   . Breast cancer Neg Hx     SOCIAL HISTORY:  Social History   Tobacco Use  . Smoking status: Never Smoker  . Smokeless tobacco: Never Used  Vaping Use  . Vaping Use: Never used  Substance Use Topics  . Alcohol use: Never  . Drug use: No    ALLERGIES:  Allergies  Allergen Reactions  . Ceftin [Cefuroxime]     Causes yeast in throat   .  Crestor [Rosuvastatin Calcium] Other (See Comments)    Causes calf pain  . Latex Itching and Rash    MEDICATIONS:  Current Outpatient Medications  Medication Sig Dispense Refill  . Calcium Carb-Cholecalciferol (CALCIUM-VITAMIN D3) 600-400 MG-UNIT TABS Take 1 tablet by mouth daily.    Marland Kitchen CALCIUM PO Take by mouth. (Patient not taking: Reported on 03/16/2020)    . Cholecalciferol (VITAMIN D) 125 MCG (5000 UT) CAPS Take 5,000 Units by mouth daily.    Marland Kitchen dexamethasone (DECADRON) 4 MG tablet Take 2 tabs at the night before and 2 tab the morning of chemotherapy, every 3 weeks, by mouth x 6 cycles 36 tablet 6  . ibuprofen (ADVIL) 800 MG tablet Take 1 tablet (800 mg total) by mouth every 8 (eight) hours as needed for moderate pain. For AFTER surgery only 30 tablet 0  . Omega-3 Fatty Acids (FISH OIL PO) Take by mouth. (Patient not taking: Reported on 03/16/2020)    . ondansetron (ZOFRAN) 8 MG tablet Take 1 tablet (8 mg total) by mouth every 8 (eight) hours as needed. Start on the third day after chemotherapy. 30 tablet 1  . oxyCODONE (OXY IR/ROXICODONE) 5 MG immediate release tablet Take 1 tablet (5 mg total) by mouth every  4 (four) hours as needed for severe pain. For AFTER surgery, do not take and drive 10 tablet 0  . pantoprazole (PROTONIX) 40 MG tablet Take 40 mg by mouth daily as needed (acid reduction).     . prochlorperazine (COMPAZINE) 10 MG tablet Take 1 tablet (10 mg total) by mouth every 6 (six) hours as needed (Nausea or vomiting). 30 tablet 1  . senna-docusate (SENOKOT-S) 8.6-50 MG tablet Take 2 tablets by mouth at bedtime. For AFTER surgery, do not take if having diarrhea 30 tablet 0  . VITAMIN E PO Take by mouth. (Patient not taking: Reported on 03/16/2020)     No current facility-administered medications for this encounter.    REVIEW OF SYSTEMS:  A 10+ POINT REVIEW OF SYSTEMS WAS OBTAINED including neurology, dermatology, psychiatry, cardiac, respiratory, lymph, extremities, GI, GU,  musculoskeletal, constitutional, reproductive, HEENT.  The patient's urinary symptoms seem to be improving the further she gets away from her surgery   PHYSICAL EXAM:  height is 5\' 4"  (1.626 m) and weight is 138 lb (62.6 kg). Her oral temperature is 97.6 F (36.4 C). Her blood pressure is 117/90 and her pulse is 81. Her respiration is 18 and oxygen saturation is 100%.   General: Alert and oriented, in no acute distress HEENT: Head is normocephalic. Extraocular movements are intact. Oropharynx is clear. Neck: Neck is supple, no palpable cervical or supraclavicular lymphadenopathy. Heart: Regular in rate and rhythm with no murmurs, rubs, or gallops. Chest: Clear to auscultation bilaterally, with no rhonchi, wheezes, or rales. Abdomen: Soft, nontender, nondistended, with no rigidity or guarding. Extremities: No cyanosis or edema. Lymphatics: see Neck Exam Skin: No concerning lesions. Musculoskeletal: symmetric strength and muscle tone throughout. Neurologic: Cranial nerves II through XII are grossly intact. No obvious focalities. Speech is fluent. Coordination is intact. Psychiatric: Judgment and insight are intact. Affect is appropriate. Pelvic exam deferred today,  she was examined earlier today by Dr. Berline Lopes and the vaginal cuff was noted to be healing well  ECOG = 1  0 - Asymptomatic (Fully active, able to carry on all predisease activities without restriction)  1 - Symptomatic but completely ambulatory (Restricted in physically strenuous activity but ambulatory and able to carry out work of a light or sedentary nature. For example, light housework, office work)  2 - Symptomatic, <50% in bed during the day (Ambulatory and capable of all self care but unable to carry out any work activities. Up and about more than 50% of waking hours)  3 - Symptomatic, >50% in bed, but not bedbound (Capable of only limited self-care, confined to bed or chair 50% or more of waking hours)  4 - Bedbound  (Completely disabled. Cannot carry on any self-care. Totally confined to bed or chair)  5 - Death   Eustace Pen MM, Creech RH, Tormey DC, et al. [phone]). "Toxicity and response criteria of the [facility]". [place] Oncol. 5 (6): 649-55  LABORATORY DATA:  Lab Results  Component Value Date   WBC 11.3 (H) 05/18/2020   HGB 12.2 05/18/2020   HCT 36.8 05/18/2020   MCV 84.8 05/18/2020   PLT 234 05/18/2020   NEUTROABS 8.0 (H) 05/18/2020   Lab Results  Component Value Date   NA 138 05/18/2020   K 3.7 05/18/2020   CL 105 05/18/2020   CO2 24 05/18/2020   GLUCOSE 98 05/18/2020   CREATININE 0.71 05/18/2020   CALCIUM 9.5 05/18/2020      RADIOGRAPHY: No results found.  IMPRESSION: Stage IIIA (pT3a, pN0) endometrioid endometrial adenocarcinoma, FIGO grade 2  Given the pathologic findings as noted above,  the patient would be at risk for vaginal cuff recurrence and I would recommend adjuvant radiation therapy as part of her postoperative management.  Today, I talked to the patient and sister about the findings and work-up thus far.  We discussed the natural history of endometrial cancer and general treatment, highlighting the role of radiotherapy (brachytherapy) in the management.  We discussed the available radiation techniques, and focused on the details of logistics and delivery.  We reviewed the anticipated acute and late sequelae associated with radiation in this setting.  The patient was encouraged to ask questions that I answered to the best of my ability.  A patient consent form was discussed and signed.  We retained a copy for our records.  The patient would like to proceed with radiation.  PLAN: The patient will continue with additional chemotherapy. Approximately 1 month from now the patient will be scheduled to begin her vaginal brachytherapy.  To assist with vaginal cylinder placement Dr. Berline Lopes has graciously agreed to help out with this procedure and  potentially her first high-dose-rate treatment.  Anticipate the patient's anxiety will lessen after her first radiation treatment.  Would recommend 5 high-dose-rate treatments directed at the vaginal cuff using iridium 192 as the high-dose-rate source.  Total time spent in this encounter was 65 minutes which included reviewing the patient's most recent consultations, follow-ups, transabdominal ultrasound, CT scans, hysterectomy, pathology reports, physical examination, and documentation.  ------------------------------------------------  Blair Promise, PhD, MD  This document serves as a record of services personally performed by Gery Pray, MD. It was created on his behalf by Clerance Lav, a trained medical scribe. The creation of this record is based on the scribe's personal observations and the provider's statements to them. This document has been checked and approved by the attending provider.

## 2020-05-23 ENCOUNTER — Ambulatory Visit
Admission: RE | Admit: 2020-05-23 | Discharge: 2020-05-23 | Disposition: A | Discharge status: Home / Self Care | Payer: 59 | Source: Ambulatory Visit | Attending: Radiation Oncology | Admitting: Radiation Oncology

## 2020-05-23 ENCOUNTER — Other Ambulatory Visit: Payer: Self-pay

## 2020-05-23 ENCOUNTER — Encounter: Payer: Self-pay | Admitting: Gynecologic Oncology

## 2020-05-23 ENCOUNTER — Inpatient Hospital Stay (HOSPITAL_BASED_OUTPATIENT_CLINIC_OR_DEPARTMENT_OTHER): Payer: 59 | Admitting: Gynecologic Oncology

## 2020-05-23 ENCOUNTER — Inpatient Hospital Stay: Payer: 59

## 2020-05-23 DIAGNOSIS — C541 Malignant neoplasm of endometrium: Secondary | ICD-10-CM

## 2020-05-23 DIAGNOSIS — G2581 Restless legs syndrome: Secondary | ICD-10-CM | POA: Diagnosis not present

## 2020-05-23 DIAGNOSIS — N895 Stricture and atresia of vagina: Secondary | ICD-10-CM

## 2020-05-23 DIAGNOSIS — C55 Malignant neoplasm of uterus, part unspecified: Secondary | ICD-10-CM | POA: Diagnosis not present

## 2020-05-23 DIAGNOSIS — Z23 Encounter for immunization: Secondary | ICD-10-CM

## 2020-05-23 DIAGNOSIS — Z79899 Other long term (current) drug therapy: Secondary | ICD-10-CM | POA: Insufficient documentation

## 2020-05-23 DIAGNOSIS — E7800 Pure hypercholesterolemia, unspecified: Secondary | ICD-10-CM | POA: Insufficient documentation

## 2020-05-23 DIAGNOSIS — R369 Urethral discharge, unspecified: Secondary | ICD-10-CM

## 2020-05-23 DIAGNOSIS — G629 Polyneuropathy, unspecified: Secondary | ICD-10-CM | POA: Insufficient documentation

## 2020-05-23 DIAGNOSIS — Z90710 Acquired absence of both cervix and uterus: Secondary | ICD-10-CM | POA: Diagnosis not present

## 2020-05-23 DIAGNOSIS — Z5111 Encounter for antineoplastic chemotherapy: Secondary | ICD-10-CM | POA: Diagnosis not present

## 2020-05-23 DIAGNOSIS — R0602 Shortness of breath: Secondary | ICD-10-CM | POA: Insufficient documentation

## 2020-05-23 DIAGNOSIS — T451X5A Adverse effect of antineoplastic and immunosuppressive drugs, initial encounter: Secondary | ICD-10-CM | POA: Diagnosis not present

## 2020-05-23 DIAGNOSIS — G620 Drug-induced polyneuropathy: Secondary | ICD-10-CM | POA: Diagnosis not present

## 2020-05-23 DIAGNOSIS — K219 Gastro-esophageal reflux disease without esophagitis: Secondary | ICD-10-CM | POA: Insufficient documentation

## 2020-05-23 DIAGNOSIS — Z90722 Acquired absence of ovaries, bilateral: Secondary | ICD-10-CM | POA: Diagnosis not present

## 2020-05-23 DIAGNOSIS — E785 Hyperlipidemia, unspecified: Secondary | ICD-10-CM | POA: Insufficient documentation

## 2020-05-23 DIAGNOSIS — Z9079 Acquired absence of other genital organ(s): Secondary | ICD-10-CM | POA: Diagnosis not present

## 2020-05-23 DIAGNOSIS — M8580 Other specified disorders of bone density and structure, unspecified site: Secondary | ICD-10-CM | POA: Insufficient documentation

## 2020-05-23 DIAGNOSIS — R110 Nausea: Secondary | ICD-10-CM | POA: Insufficient documentation

## 2020-05-23 DIAGNOSIS — F418 Other specified anxiety disorders: Secondary | ICD-10-CM | POA: Insufficient documentation

## 2020-05-23 DIAGNOSIS — E559 Vitamin D deficiency, unspecified: Secondary | ICD-10-CM | POA: Insufficient documentation

## 2020-05-23 DIAGNOSIS — Z760 Encounter for issue of repeat prescription: Secondary | ICD-10-CM | POA: Diagnosis not present

## 2020-05-23 DIAGNOSIS — M129 Arthropathy, unspecified: Secondary | ICD-10-CM | POA: Insufficient documentation

## 2020-05-23 DIAGNOSIS — R5383 Other fatigue: Secondary | ICD-10-CM | POA: Insufficient documentation

## 2020-05-23 DIAGNOSIS — Z800 Family history of malignant neoplasm of digestive organs: Secondary | ICD-10-CM | POA: Insufficient documentation

## 2020-05-23 NOTE — Patient Instructions (Signed)
I will see you at your next visit with Dr. Sondra Come. Please call my office to let me know to which pharmacy to send the Vitamin E suppositories.

## 2020-05-23 NOTE — Progress Notes (Signed)
      Incomplete         Show:Clear all [x] Manual[x] Template[x] Copied  Added by: [x] Zola Button, RN[x] Wilmon Arms, RN  [] Hover for details GYN Location of Tumor / Histology:  Stage IIIAgrade 2 endometrioid endometrial adenocarcinoma  Yancey Flemings presented with symptoms of:  The patient presented for an exam on 6/22. At that time she endorsed episodes of postmenopausal bleeding starting in November 2020. Patient's exam was limited by her intolerance in the position of the cervix. Pap test was performed on 6/30 showing atypical glandular cells of undetermined significance. 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  Biopsies revealed:  03/27/2020 FINAL MICROSCOPIC DIAGNOSIS:  A. CUL DE SAC, ANTERIOR, BIOPSY:  - Benign fibrovascular tissue with mesothelial hyperplasia  - No carcinoma identified  - See comment  B. MONS, RIGHT, BIOPSY:  - Melanocytic nevus, intradermal type  - See comment  C. LYMPH NODE, SENTINEL, RIGHT EXTERNAL ILIAC, BIOPSY:  - No carcinoma identified in one lymph node (0/1)  - See comment  D. PERITONEUM, LEFT SIDEWALL, BIOPSY:  - Benign fibrovascular tissue with chronic inflammation  - No carcinoma identified  E. LYMPH NODE, SENTINEL, LEFT EXTERNAL ILIAC, BIOPSY:  - No carcinoma identified in one lymph node (0/1)  - See comment  F. UTERUS, CERVIX, BILATERAL FALLOPIAN TUBES AND OVARIES:  Uterus:  - Endometrioid carcinoma, FIGO grade 2  - Leiomyomata (1.6 cm ; largest)  - Carcinoma present in lymphoid aggregate in serosal adhesions  (lymphovascular space invasion)  - See oncology table and comment below  Cervix:  - No carcinoma identified  Bilateral Ovaries:  - No carcinoma identified  Bilateral Fallopian tubes:  - Carcinoma within lumen of fallopian tube (left)  G. PERIURETHRAL, BIOPSY:  - No carcinoma identified  Past/Anticipated interventions by Gyn/Onc  surgery, if any:  03/27/2020 Dr. Jeral Pinch Robotic-assisted laparoscopic total hysterectomy with bilateral salpingoophorectomy, SLN biopsy   Past/Anticipated interventions by medical oncology, if any:  Under care of Dr. [name] -We discussed the role of chemotherapy. The intent is of curative intent -We discussed some of the risks, benefits, side-effects ofCarboplatin & Taxol. Treatment is intravenous, every 3 weeks x 6 cycles -She has small chronic discomfort in her pelvis with urination and has persistent vaginal/urinary discharge (She might need urology referral) -We reviewed her CT imaging extensively. There is some bladder wall thickening. However, this is not indicated if of bladder involvement from cancer. We discussed the role of cystoscopy for further evaluation. I recommend we hold off until after at least 1 cycle of treatment to see if her symptoms will improve further  Weight changes, if any: goes up and down  Bowel/Bladder complaints, if any:  Persistent serosanguinous vaginal discharge. Patient to have a Cystoscopy  Nausea/Vomiting, if any: on Zofran  Pain issues, if any: no  SAFETY ISSUES:  Prior radiation? no  Pacemaker/ICD? no  Possible current pregnancy? No--S/P total hysterectomy 03/27/20  Is the patient on methotrexate? No  Current Complaints / other details: Here with her sister.   There were no vitals taken for this visit.  Wt Readings from Last 3 Encounters:  05/23/20 138 lb (62.6 kg)  05/18/20 144 lb 3.2 oz (65.4 kg)  04/27/20 141 lb (64 kg)   T 97.6 P 81 R 18 Sat 100% BP 117/90

## 2020-05-23 NOTE — Progress Notes (Signed)
Gynecologic Oncology Return Clinic Visit  05/23/20  Reason for Visit: vaginal exam, discussion of treatment and symptoms management.  Treatment History: Oncology History Overview Note  IHC MMR normal Endometrioid MSI-stable   Endometrial cancer ([place])  02/28/2020 Initial Diagnosis   The patient presented for an exam on 6/22.  At that time she endorsed episodes of postmenopausal bleeding starting in 08/26/2019 after her mother's death.  She had not had a menses in a number of years.  Patient's exam was limited by her intolerance in the position of the cervix.  Pap test was performed on 6/30 showing atypical glandular cells of undetermined significance. 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 She describes about a year long history previously of clear vaginal discharge. She saw her PCP for this complaint and given what was felt to be urethral swelling, she was treated for a UTI (lab testing did not show an infection). She has not been sexually active since her 49s. She endorses increased urinary frequency. After a year, the clear discharge became yellow (and at one point looked green). The patient was seen again and a pap was attempted in the office but she was unable to tolerate this. She performed a vaginal swab on herself that found yeast. She used Monistat multiple times with improvement in her discharge. She then began to have pale pink discharge. At this time, she was seen by a Urogynecologist at [facility] and it sound like she had urodynamic testing. She describes a raw feeling and burning with her urine. She continues to have vaginal discharge which she notes has an ammonia-like smell. Her vaginal bleeding started again about a month ago. She briefly tried vaginal estrogen after seeing the Urogynecologist but she felt that the bleeding worsened as did the ammonia smell, so she stopped.   03/16/2020 Initial Diagnosis   Endometrial cancer  ([place])   03/21/2020 Imaging   1. Abnormally thickened endometrial complex measuring up to 12 mm, presumably related to known history of endometrial carcinoma. 2. Underlying fibroid uterus as detailed above. 3. Normal sonographic evaluation of the ovaries. No abnormal free fluid. Please note that the appendix appears to be potentially located in close proximity to the right ovary on this exam. As this patient is scheduled to undergo total hysterectomy with bilateral salpingo oophorectomy in the near future, close attention to this region at surgical intervention is recommended.     03/27/2020 Pathology Results   A. CUL DE SAC, ANTERIOR, BIOPSY:  -  Benign fibrovascular tissue with mesothelial hyperplasia  -  No carcinoma identified  -  See comment   B. MONS, RIGHT, BIOPSY:  -  Melanocytic nevus, intradermal type  -  See comment   C. LYMPH NODE, SENTINEL, RIGHT EXTERNAL ILIAC, BIOPSY:  -  No carcinoma identified in one lymph node (0/1)  -  See comment   D. PERITONEUM, LEFT SIDEWALL, BIOPSY:  -  Benign fibrovascular tissue with chronic inflammation  -  No carcinoma identified   E. LYMPH NODE, SENTINEL, LEFT EXTERNAL ILIAC, BIOPSY:  -  No carcinoma identified in one lymph node (0/1)  -  See comment   F. UTERUS, CERVIX, BILATERAL FALLOPIAN TUBES AND OVARIES:   Uterus:  -  Endometrioid carcinoma, FIGO grade 2  -  Leiomyomata (1.6 cm ; largest)  -  Carcinoma present in lymphoid aggregate in serosal adhesions  (lymphovascular space invasion)  -  See oncology table and comment below   Cervix:  -  No carcinoma identified   Bilateral Ovaries:  -  No carcinoma identified   Bilateral Fallopian tubes:  -  Carcinoma within lumen of fallopian tube  (left)   G. PERIURETHRAL, BIOPSY:  -  No carcinoma identified    03/27/2020 Surgery   Robotic-assisted laparoscopic total hysterectomy with bilateral salpingoophorectomy, SLN biopsy   On EUA, very narrow introitus most due to tight  hymenal ring. Small mobile uterus. Hyperpigmented 0.46m plaque on right mons, biopsied. Urethral overall normal in appearance, given hyperemic appearance in clinic, biopsy taken. On intra-abdominal entry, some scarring noted on inferior aspect of the liver, anterior right diaphragm and left lobe of the liver. Omentum normal appearing. Mesentery of the small and large bowel as well as the bowel itself studded with 1-277minflammatory-appearing nodules. Appendix with same studding, otherwise normal in appearance. Uterus 6cm and normal appearing with the exception of inflammatory exudate on posterior aspect of the fundus. Bilateral adnexa normal appearing. Chocolate brown staining within most of the cul-de-sac. Inflammatory appearing nodules studding bilateral pelvic walls, anterior and posterior cul-de-sac c/w endometriosis. No adenopathy. No intra-abdominal or pelvic evidence of disease.   03/2020 Initial Biopsy   EMB - gr 1-2 EMCA   03/27/2020 Cancer Staging   Staging form: Corpus Uteri - Carcinoma and Carcinosarcoma, AJCC 8th Edition - Clinical stage from 03/27/2020: FIGO Stage IIIA (cT3a, cN0(sn), cM0) - Signed by GoHeath LarkMD on 04/24/2020   04/23/2020 Imaging   Status post hysterectomy and bilateral salpingo-oophorectomy.   Mild stranding along the anterior transverse mesocolon. No frank peritoneal nodularity or omental caking. Attention on follow-up is suggested.   No findings specific for recurrent or metastatic disease.   Bladder is mildly thick-walled although underdistended.     04/27/2020 -  Chemotherapy   The patient had carboplatin and taxol for chemotherapy treatment.       Interval History: Emily [name] now s/p cycle 2 of carboplatin/taxol or treatment of her endometrial cancer. She comes in today to see Dr. KiSondra [name] consultation to discuss vaginal brachytherapy. She has noted some improvement in her urinary and vulvar symptoms. She's had minimal dark brown vaginal spotting since  last week. She continues to have discharge that she feels is coming from her urethra - this has improved but is still present. She also still notes an ammonia smell to the discharge.   Past Medical/Surgical History: Past Medical History:  Diagnosis Date  . Abnormal EKG 11November 29, 2016. Anxiety and depression    pt denies 7/16/  . Bruises easily   . Chest pain   . Chest pain with moderate risk of acute coronary syndrome   . Chest tightness   . Difficult intubation    throat feels small pt has acid reflux ? scars due to acid   . Elevated cholesterol   . [place]  . Family history of premature CAD 1111/29/16 Father had MI 4039'sdied at 5074f an MI   . Fatigue   . Fibroid   . Fx sacrum[place]cyx-closed ([place]  . Gastritis   . GERD (gastroesophageal reflux disease)   . Hyperlipidemia 1111/29/2016. Osteopenia   . Pigmented skin lesions   . Pre-diabetes   . Sinus problem   . SOB (shortness of breath) on exertion   . Urethra disorder    currently swollen per pt   . Vitamin D deficiency   . Weight gain     Past Surgical History:  Procedure Laterality Date  . ADENOIDECTOMY    .  COLONOSCOPY    . ROBOTIC ASSISTED TOTAL HYSTERECTOMY WITH BILATERAL SALPINGO OOPHERECTOMY Bilateral 03/27/2020   Procedure: XI ROBOTIC ASSISTED TOTAL HYSTERECTOMY WITH BILATERAL SALPINGO OOPHORECTOMY;  Surgeon: [name], MD;  Location: [facility];  Service: Gynecology;  Laterality: Bilateral;  . SENTINEL NODE BIOPSY N/A 03/27/2020   Procedure: SENTINEL NODE BIOPSY, URETHRAL BIOPSY AND VAGINAL CULTURE;  Surgeon: [name], MD;  Location: [facility];  Service: Gynecology;  Laterality: N/A;  . SMALL BOWEL ENTEROSCOPY    . TONSILLECTOMY      Family History  Problem Relation Age of Onset  . Atrial fibrillation Mother   . Hypertension Mother   . Stroke Mother   . Arthritis Mother   . Atrial fibrillation Father   . Heart attack Father 30  . Hypertension Father   . COPD Father   .  Diabetes Father   . Colon cancer Paternal Grandmother   . Breast cancer Neg Hx     Social History   Socioeconomic History  . Marital status: Single    Spouse name: Not on file  . Number of children: 0  . Years of education: 58  . Highest education level: Not on file  Occupational History  . Occupation: Therapist, sports  Tobacco Use  . Smoking status: Never Smoker  . Smokeless tobacco: Never Used  Vaping Use  . Vaping Use: Never used  Substance and Sexual Activity  . Alcohol use: Never  . Drug use: No  . Sexual activity: Not Currently  Other Topics Concern  . Not on file  Social History Narrative  . Not on file   Social Determinants of Health   Financial Resource Strain:   . Difficulty of Paying Living Expenses: Not on file  Food Insecurity:   . Worried About Charity fundraiser in the Last Year: Not on file  . Ran Out of Food in the Last Year: Not on file  Transportation Needs:   . Lack of Transportation (Medical): Not on file  . Lack of Transportation (Non-Medical): Not on file  Physical Activity:   . Days of Exercise per Week: Not on file  . Minutes of Exercise per Session: Not on file  Stress:   . Feeling of Stress : Not on file  Social Connections:   . Frequency of Communication with Friends and Family: Not on file  . Frequency of Social Gatherings with Friends and Family: Not on file  . Attends Religious Services: Not on file  . Active Member of Clubs or Organizations: Not on file  . Attends Archivist Meetings: Not on file  . Marital Status: Not on file    Current Medications:  Current Outpatient Medications:  .  Calcium Carb-Cholecalciferol (CALCIUM-VITAMIN D3) 600-400 MG-UNIT TABS, Take 1 tablet by mouth daily., Disp: , Rfl:  .  CALCIUM PO, Take by mouth. (Patient not taking: Reported on 03/16/2020), Disp: , Rfl:  .  Cholecalciferol (VITAMIN D) 125 MCG (5000 UT) CAPS, Take 5,000 Units by mouth daily., Disp: , Rfl:  .  dexamethasone (DECADRON) 4 MG tablet,  Take 2 tabs at the night before and 2 tab the morning of chemotherapy, every 3 weeks, by mouth x 6 cycles, Disp: 36 tablet, Rfl: 6 .  ibuprofen (ADVIL) 800 MG tablet, Take 1 tablet (800 mg total) by mouth every 8 (eight) hours as needed for moderate pain. For AFTER surgery only, Disp: 30 tablet, Rfl: 0 .  Omega-3 Fatty Acids (FISH OIL PO), Take by mouth. (Patient not taking:  Reported on 03/16/2020), Disp: , Rfl:  .  ondansetron (ZOFRAN) 8 MG tablet, Take 1 tablet (8 mg total) by mouth every 8 (eight) hours as needed. Start on the third day after chemotherapy., Disp: 30 tablet, Rfl: 1 .  oxyCODONE (OXY IR/ROXICODONE) 5 MG immediate release tablet, Take 1 tablet (5 mg total) by mouth every 4 (four) hours as needed for severe pain. For AFTER surgery, do not take and drive, Disp: 10 tablet, Rfl: 0 .  pantoprazole (PROTONIX) 40 MG tablet, Take 40 mg by mouth daily as needed (acid reduction). , Disp: , Rfl:  .  prochlorperazine (COMPAZINE) 10 MG tablet, Take 1 tablet (10 mg total) by mouth every 6 (six) hours as needed (Nausea or vomiting)., Disp: 30 tablet, Rfl: 1 .  senna-docusate (SENOKOT-S) 8.6-50 MG tablet, Take 2 tablets by mouth at bedtime. For AFTER surgery, do not take if having diarrhea, Disp: 30 tablet, Rfl: 0 .  VITAMIN E PO, Take by mouth. (Patient not taking: Reported on 03/16/2020), Disp: , Rfl:   Review of Systems: Denies appetite changes, fevers, chills, fatigue, unexplained weight changes. Denies hearing loss, neck lumps or masses, mouth sores, ringing in ears or voice changes. Denies cough or wheezing.  Denies shortness of breath. Denies chest pain or palpitations. Denies leg swelling. Denies abdominal distention, pain, blood in stools, constipation, diarrhea, nausea, vomiting, or early satiety. Denies pain with intercourse, dysuria, frequency, hematuria or incontinence. Denies hot flashes, pelvic pain, vaginal bleeding or vaginal discharge.   Denies joint pain, back pain or muscle  pain/cramps. Denies itching, rash, or wounds. Denies dizziness, headaches, numbness or seizures. Denies swollen lymph nodes or glands, denies easy bruising or bleeding. Denies anxiety, depression, confusion, or decreased concentration.  Physical Exam: See Dr. Clabe Seal note for today's vital signs. General: Alert, oriented, no acute distress. HEENT: Normocephalic, atraumatic, sclera anicteric. Chest: Unlabored breathing on room air.  GU: Normal appearing external genitalia without erythema, excoriation, or lesions.  Significant lubrication was used for a one digit exam, which was performed slowly and was well toelrated by the patient. She has anxiety and some pain with initial insertion (I think due to her introitus being so narrow) but overall tolerated the exam well. Speculum exam with pediatric size speculum reveals minimal older appearing blood in the vault. Cuff is healing well, intact, one suture still visible.    Laboratory & Radiologic Studies: CBC    Component Value Date/Time   WBC 11.3 (H) 05/18/2020 0941   WBC 6.8 03/23/2020 0850   RBC 4.34 05/18/2020 0941   HGB 12.2 05/18/2020 0941   HCT 36.8 05/18/2020 0941   PLT 234 05/18/2020 0941   MCV 84.8 05/18/2020 0941   MCH 28.1 05/18/2020 0941   MCHC 33.2 05/18/2020 0941   RDW 13.9 05/18/2020 0941   LYMPHSABS 2.4 05/18/2020 0941   MONOABS 0.8 05/18/2020 0941   EOSABS 0.0 05/18/2020 0941   BASOSABS 0.0 05/18/2020 0941   CMP Latest Ref Rng & Units 05/18/2020 04/24/2020 03/23/2020  Glucose 70 - 99 mg/dL 98 101(H) 96  BUN 6 - 20 mg/dL 20 12 12   Creatinine 0.44 - 1.00 mg/dL 0.71 0.88 0.81  Sodium 135 - 145 mmol/L 138 141 139  Potassium 3.5 - 5.1 mmol/L 3.7 4.3 4.1  Chloride 98 - 111 mmol/L 105 105 104  CO2 22 - 32 mmol/L 24 27 26   Calcium 8.9 - 10.3 mg/dL 9.5 10.2 8.9  Total Protein 6.5 - 8.1 g/dL 7.5 7.2 7.2  Total Bilirubin 0.3 - 1.2  mg/dL 0.2(L) 0.6 0.7  Alkaline Phos 38 - 126 U/L 82 69 62  AST 15 - 41 U/L 19 14(L) 18   ALT 0 - 44 U/L 17 11 13    Assessment & Plan: ARRIYAH MADEJ is a 59 y.o. woman with Stage IIIAgrade 2 endometrioid endometrial adenocarcinomawho presents for radiation oncology consultation.  I performed an exam today that was overall very well tolerated. Joelene Millin and I had previously discussed the use of IM pain medication for today's exam but she preferred to avoid this. Her cuff is healing well and I think it will be feasible to move forward with vaginal brachytherapy (likely in the next 4-6 weeks). I will plan to be present for her first cylinder placement and treatment.   I have recommended that we defer any sort of urologic work-up until after she finishes adjuvant treatment. She has had some improvement in her symptoms and my hope is that she will continue to see improvement. I still think that she would benefit from vaginal estrogen. She has concerns about the use of vaginal estrogen given pathogenesis of endometrial cancer. She and I have talked about the safety of vaginal estrogen in patients with her diagnosis. Her preference is to try Vitamin E suppositories. She has previously had success with the use of Vitamin E. She will call my office to let me know where to send the prescription.   Overall, the patient is healing well from a postoperative standpoint.  We discussed in detail the postoperative expectations over the next couple of weeks.  She is very interested in restarting vitamin E vaginal suppositories again.  I have asked her to wait until she is 6 weeks out from surgery.  We also discussed her return to work.  She very much enjoys her job and would like to continue working during treatment.  While some patients have significant side effects and have to take time off of work, work part-time, or take short-term disability, other patients are able to work through chemotherapy.  I have encouraged her to talk to her boss to let her know that chemotherapy treatment will last about 4  months and that it is difficult to predict how she will do from a symptom standpoint until we get started with treatment.  35 minutes of total time was spent for this patient encounter, including preparation, face-to-face counseling with the patient and coordination of care, and documentation of the encounter.  Jeral Pinch, MD  Division of Gynecologic Oncology  Department of Obstetrics and Gynecology  [facility]

## 2020-05-23 NOTE — Progress Notes (Signed)
   Covid-19 Vaccination Clinic  Name:  Emily [name]    MRN: [id number] DOB: 1961-07-28  05/23/2020  [name] was observed post Covid-19 immunization for 15 minutes without incident. She was provided with Vaccine Information Sheet and instruction to access the V-Safe system.   [name] was instructed to call 911 with any severe reactions post vaccine: Marland Kitchen Difficulty breathing  . Swelling of face and throat  . A fast heartbeat  . A bad rash all over body  . Dizziness and weakness

## 2020-06-05 ENCOUNTER — Encounter: Payer: Self-pay | Admitting: Gynecologic Oncology

## 2020-06-12 ENCOUNTER — Inpatient Hospital Stay (HOSPITAL_BASED_OUTPATIENT_CLINIC_OR_DEPARTMENT_OTHER): Payer: 59 | Admitting: Hematology and Oncology

## 2020-06-12 ENCOUNTER — Other Ambulatory Visit: Payer: Self-pay

## 2020-06-12 ENCOUNTER — Inpatient Hospital Stay: Payer: 59 | Attending: Gynecologic Oncology

## 2020-06-12 ENCOUNTER — Inpatient Hospital Stay: Payer: 59

## 2020-06-12 DIAGNOSIS — Z90710 Acquired absence of both cervix and uterus: Secondary | ICD-10-CM | POA: Insufficient documentation

## 2020-06-12 DIAGNOSIS — G620 Drug-induced polyneuropathy: Secondary | ICD-10-CM

## 2020-06-12 DIAGNOSIS — K5909 Other constipation: Secondary | ICD-10-CM

## 2020-06-12 DIAGNOSIS — Z9079 Acquired absence of other genital organ(s): Secondary | ICD-10-CM | POA: Insufficient documentation

## 2020-06-12 DIAGNOSIS — Z7952 Long term (current) use of systemic steroids: Secondary | ICD-10-CM | POA: Insufficient documentation

## 2020-06-12 DIAGNOSIS — F419 Anxiety disorder, unspecified: Secondary | ICD-10-CM | POA: Diagnosis not present

## 2020-06-12 DIAGNOSIS — Z791 Long term (current) use of non-steroidal anti-inflammatories (NSAID): Secondary | ICD-10-CM | POA: Insufficient documentation

## 2020-06-12 DIAGNOSIS — R5381 Other malaise: Secondary | ICD-10-CM

## 2020-06-12 DIAGNOSIS — K1231 Oral mucositis (ulcerative) due to antineoplastic therapy: Secondary | ICD-10-CM | POA: Diagnosis not present

## 2020-06-12 DIAGNOSIS — Z87440 Personal history of urinary (tract) infections: Secondary | ICD-10-CM | POA: Diagnosis not present

## 2020-06-12 DIAGNOSIS — K219 Gastro-esophageal reflux disease without esophagitis: Secondary | ICD-10-CM | POA: Diagnosis not present

## 2020-06-12 DIAGNOSIS — Z90722 Acquired absence of ovaries, bilateral: Secondary | ICD-10-CM | POA: Diagnosis not present

## 2020-06-12 DIAGNOSIS — C541 Malignant neoplasm of endometrium: Secondary | ICD-10-CM

## 2020-06-12 DIAGNOSIS — T451X5A Adverse effect of antineoplastic and immunosuppressive drugs, initial encounter: Secondary | ICD-10-CM | POA: Diagnosis not present

## 2020-06-12 DIAGNOSIS — R748 Abnormal levels of other serum enzymes: Secondary | ICD-10-CM | POA: Insufficient documentation

## 2020-06-12 DIAGNOSIS — Z5111 Encounter for antineoplastic chemotherapy: Secondary | ICD-10-CM | POA: Insufficient documentation

## 2020-06-12 DIAGNOSIS — D6481 Anemia due to antineoplastic chemotherapy: Secondary | ICD-10-CM | POA: Diagnosis not present

## 2020-06-12 DIAGNOSIS — Z79899 Other long term (current) drug therapy: Secondary | ICD-10-CM | POA: Insufficient documentation

## 2020-06-12 LAB — CMP (CANCER CENTER ONLY)
ALT: 47 U/L — ABNORMAL HIGH (ref 0–44)
AST: 45 U/L — ABNORMAL HIGH (ref 15–41)
Albumin: 4.2 g/dL (ref 3.5–5.0)
Alkaline Phosphatase: 80 U/L (ref 38–126)
Anion gap: 9 (ref 5–15)
BUN: 19 mg/dL (ref 6–20)
CO2: 25 mmol/L (ref 22–32)
Calcium: 9.9 mg/dL (ref 8.9–10.3)
Chloride: 106 mmol/L (ref 98–111)
Creatinine: 0.78 mg/dL (ref 0.44–1.00)
GFR, Estimated: >60 mL/min (ref >60)
Glucose, Bld: 126 mg/dL — ABNORMAL HIGH (ref 70–99)
Potassium: 3.9 mmol/L (ref 3.5–5.1)
Sodium: 140 mmol/L (ref 135–145)
Total Bilirubin: 0.3 mg/dL (ref 0.3–1.2)
Total Protein: 7.5 g/dL (ref 6.5–8.1)

## 2020-06-12 LAB — CBC WITH DIFFERENTIAL (CANCER CENTER ONLY)
Abs Immature Granulocytes: 0.03 10*3/uL (ref 0.00–0.07)
Basophils Absolute: 0 10*3/uL (ref 0.0–0.1)
Basophils Relative: 0 %
Eosinophils Absolute: 0 10*3/uL (ref 0.0–0.5)
Eosinophils Relative: 0 %
HCT: 34.5 % — ABNORMAL LOW (ref 36.0–46.0)
Hemoglobin: 11.8 g/dL — ABNORMAL LOW (ref 12.0–15.0)
Immature Granulocytes: 0 %
Lymphocytes Relative: 17 %
Lymphs Abs: 1.6 10*3/uL (ref 0.7–4.0)
MCH: 28.9 pg (ref 26.0–34.0)
MCHC: 34.2 g/dL (ref 30.0–36.0)
MCV: 84.4 fL (ref 80.0–100.0)
Monocytes Absolute: 0.4 10*3/uL (ref 0.1–1.0)
Monocytes Relative: 4 %
Neutro Abs: 7.5 10*3/uL (ref 1.7–7.7)
Neutrophils Relative %: 79 %
Platelet Count: 228 10*3/uL (ref 150–400)
RBC: 4.09 MIL/uL (ref 3.87–5.11)
RDW: 14.7 % (ref 11.5–15.5)
WBC Count: 9.6 10*3/uL (ref 4.0–10.5)
nRBC: 0 % (ref 0.0–0.2)

## 2020-06-12 MED ORDER — SODIUM CHLORIDE 0.9 % IV SOLN
615.4550 mg | Freq: Once | INTRAVENOUS | Status: AC
  Administered 2020-06-12: 620 mg via INTRAVENOUS
  Filled 2020-06-12: qty 62

## 2020-06-12 MED ORDER — FAMOTIDINE IN NACL 20-0.9 MG/50ML-% IV SOLN
INTRAVENOUS | Status: AC
  Filled 2020-06-12: qty 50

## 2020-06-12 MED ORDER — DIPHENHYDRAMINE HCL 50 MG/ML IJ SOLN
INTRAMUSCULAR | Status: AC
  Filled 2020-06-12: qty 1

## 2020-06-12 MED ORDER — PALONOSETRON HCL INJECTION 0.25 MG/5ML
INTRAVENOUS | Status: AC
  Filled 2020-06-12: qty 5

## 2020-06-12 MED ORDER — MAGIC MOUTHWASH W/LIDOCAINE: 5.0000 mL | Freq: Four times a day (QID) | ORAL | 0 refills | Status: DC

## 2020-06-12 MED ORDER — SODIUM CHLORIDE 0.9 % IV SOLN
150.0000 mg | Freq: Once | INTRAVENOUS | Status: AC
  Administered 2020-06-12: 150 mg via INTRAVENOUS
  Filled 2020-06-12: qty 150

## 2020-06-12 MED ORDER — SODIUM CHLORIDE 0.9 % IV SOLN
10.0000 mg | Freq: Once | INTRAVENOUS | Status: AC
  Administered 2020-06-12: 10 mg via INTRAVENOUS
  Filled 2020-06-12: qty 10

## 2020-06-12 MED ORDER — SODIUM CHLORIDE 0.9 % IV SOLN
140.0000 mg/m2 | Freq: Once | INTRAVENOUS | Status: AC
  Administered 2020-06-12: 240 mg via INTRAVENOUS
  Filled 2020-06-12: qty 40

## 2020-06-12 MED ORDER — DIPHENHYDRAMINE HCL 50 MG/ML IJ SOLN
12.5000 mg | Freq: Once | INTRAMUSCULAR | Status: AC
  Administered 2020-06-12: 12.5 mg via INTRAVENOUS

## 2020-06-12 MED ORDER — SODIUM CHLORIDE 0.9 % IV SOLN
Freq: Once | INTRAVENOUS | Status: AC
  Filled 2020-06-12: qty 250

## 2020-06-12 MED ORDER — FAMOTIDINE IN NACL 20-0.9 MG/50ML-% IV SOLN
20.0000 mg | Freq: Once | INTRAVENOUS | Status: AC
  Administered 2020-06-12: 20 mg via INTRAVENOUS

## 2020-06-12 MED ORDER — PALONOSETRON HCL INJECTION 0.25 MG/5ML
0.2500 mg | Freq: Once | INTRAVENOUS | Status: AC
  Administered 2020-06-12: 0.25 mg via INTRAVENOUS

## 2020-06-12 MED FILL — MAGIC MOUTHWASH D/M/L: 8 days supply | Qty: 240 | Fill #0

## 2020-06-12 NOTE — Progress Notes (Signed)
Called to inform pt that Rx for Magic Mouthwash was called in to [facility]. Pt did not answer. LVM for pt to return call.

## 2020-06-12 NOTE — Patient Instructions (Signed)
[facility] Discharge Instructions for Patients Receiving Chemotherapy  Today you received the following chemotherapy agents Paclitaxel; Carboplatin  To help prevent nausea and vomiting after your treatment, we encourage you to take your nausea medication as directed   If you develop nausea and vomiting that is not controlled by your nausea medication, call the clinic.   BELOW ARE SYMPTOMS THAT SHOULD BE REPORTED IMMEDIATELY:  *FEVER GREATER THAN 100.5 F  *CHILLS WITH OR WITHOUT FEVER  NAUSEA AND VOMITING THAT IS NOT CONTROLLED WITH YOUR NAUSEA MEDICATION  *UNUSUAL SHORTNESS OF BREATH  *UNUSUAL BRUISING OR BLEEDING  TENDERNESS IN MOUTH AND THROAT WITH OR WITHOUT PRESENCE OF ULCERS  *URINARY PROBLEMS  *BOWEL PROBLEMS  UNUSUAL RASH Items with * indicate a potential emergency and should be followed up as soon as possible.  Feel free to call the clinic should you have any questions or concerns. The clinic phone number is [phone].  Please show the CHEMO ALERT CARD at check-in to the Emergency Department and triage nurse.   

## 2020-06-13 ENCOUNTER — Encounter: Payer: Self-pay | Admitting: Hematology and Oncology

## 2020-06-13 DIAGNOSIS — K1231 Oral mucositis (ulcerative) due to antineoplastic therapy: Secondary | ICD-10-CM | POA: Insufficient documentation

## 2020-06-13 DIAGNOSIS — R5381 Other malaise: Secondary | ICD-10-CM | POA: Insufficient documentation

## 2020-06-13 DIAGNOSIS — R748 Abnormal levels of other serum enzymes: Secondary | ICD-10-CM | POA: Insufficient documentation

## 2020-06-13 DIAGNOSIS — K5909 Other constipation: Secondary | ICD-10-CM | POA: Insufficient documentation

## 2020-06-13 DIAGNOSIS — D6481 Anemia due to antineoplastic chemotherapy: Secondary | ICD-10-CM | POA: Insufficient documentation

## 2020-06-13 DIAGNOSIS — T451X5A Adverse effect of antineoplastic and immunosuppressive drugs, initial encounter: Secondary | ICD-10-CM | POA: Insufficient documentation

## 2020-06-13 NOTE — Assessment & Plan Note (Signed)
Her examination is benign I recommend Magic mouthwash swish and swallow with lidocaine as needed in the future

## 2020-06-13 NOTE — Progress Notes (Signed)
[facility] OFFICE PROGRESS NOTE  Patient Care Team: [name], MD as PCP - General (Family Medicine)  ASSESSMENT & PLAN:  Endometrial cancer ([place]) Overall, she tolerated treatment well except for some shortness of breath on exertion, constipation and mild progressive peripheral neuropathy She also have experience of mild mucositis I plan mild dose adjustment I spent a lot of time reassuring the patient  Mucositis due to antineoplastic therapy Her examination is benign I recommend Magic mouthwash swish and swallow with lidocaine as needed in the future  Peripheral neuropathy due to chemotherapy [employer]) she has mild peripheral neuropathy, likely related to side effects of treatment. I plan to reduce the dose of treatment as outlined above.  I explained to the patient the rationale of this strategy and reassured the patient it would not compromise the efficacy of treatment   Anemia due to antineoplastic chemotherapy This is likely due to recent treatment. The patient denies recent history of bleeding such as epistaxis, hematuria or hematochezia. She is asymptomatic from the anemia. I will observe for now.  She does not require transfusion now. I will continue the chemotherapy at current dose without dosage adjustment.  If the anemia gets progressive worse in the future, I might have to delay her treatment or adjust the chemotherapy dose.   Elevated liver enzymes This is due to treatment Plan to adjust dose as above  Other constipation We have extensive discussions about the role of laxative therapy  Physical deconditioning She has symptoms of deconditioning We discussed extensively about the timing when she returns back to work and accommodation from workplace The patient has significant anxiety over the prospect of losing her job while undergoing chemotherapy I have tried my best to reassure the patient and will help her with whatever paperwork required to ensure  that she does not get fired while undergoing chemotherapy   No orders of the defined types were placed in this encounter.   All questions were answered. The patient knows to call the clinic with any problems, questions or concerns. The total time spent in the appointment was 55 minutes encounter with patients including review of chart and various tests results, discussions about plan of care and coordination of care plan   [name], MD 06/13/2020 9:42 AM  INTERVAL HISTORY: Please see below for problem oriented charting. She returns with his sister for further follow-up Her pelvic pain has improved She has shortness of breath on minimal exertion She had significant constipation with recent chemo She has bilateral peripheral neuropathy affecting her fingers more than her feet She has occasional GERD She felt that she has intermittent throat swelling and lymph node swelling on the neck No recent fever or chills She has extensive concerns about the prospect of losing her job while undergoing chemotherapy SUMMARY OF ONCOLOGIC HISTORY: Oncology History Overview Note  IHC MMR normal Endometrioid MSI-stable   Endometrial cancer ([place])  02/28/2020 Initial Diagnosis   The patient presented for an exam on 6/22.  At that time she endorsed episodes of postmenopausal bleeding starting in 08-08-2019 after her mother's death.  She had not had a menses in a number of years.  Patient's exam was limited by her intolerance in the position of the cervix.  Pap test was performed on 6/30 showing atypical glandular cells of undetermined significance. 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 She describes about a year long history previously of clear vaginal discharge. She saw her PCP for this  complaint and given what was felt to be urethral swelling, she was treated for a UTI (lab testing did not show an infection). She has not been sexually active since  her 20s. She endorses increased urinary frequency. After a year, the clear discharge became yellow (and at one point looked green). The patient was seen again and a pap was attempted in the office but she was unable to tolerate this. She performed a vaginal swab on herself that found yeast. She used Monistat multiple times with improvement in her discharge. She then began to have pale pink discharge. At this time, she was seen by a Urogynecologist at [facility] and it sound like she had urodynamic testing. She describes a raw feeling and burning with her urine. She continues to have vaginal discharge which she notes has an ammonia-like smell. Her vaginal bleeding started again about a month ago. She briefly tried vaginal estrogen after seeing the Urogynecologist but she felt that the bleeding worsened as did the ammonia smell, so she stopped.   03/16/2020 Initial Diagnosis   Endometrial cancer ([place])   03/21/2020 Imaging   1. Abnormally thickened endometrial complex measuring up to 12 mm, presumably related to known history of endometrial carcinoma. 2. Underlying fibroid uterus as detailed above. 3. Normal sonographic evaluation of the ovaries. No abnormal free fluid. Please note that the appendix appears to be potentially located in close proximity to the right ovary on this exam. As this patient is scheduled to undergo total hysterectomy with bilateral salpingo oophorectomy in the near future, close attention to this region at surgical intervention is recommended.     03/27/2020 Pathology Results   A. CUL DE SAC, ANTERIOR, BIOPSY:  -  Benign fibrovascular tissue with mesothelial hyperplasia  -  No carcinoma identified  -  See comment   B. MONS, RIGHT, BIOPSY:  -  Melanocytic nevus, intradermal type  -  See comment   C. LYMPH NODE, SENTINEL, RIGHT EXTERNAL ILIAC, BIOPSY:  -  No carcinoma identified in one lymph node (0/1)  -  See comment   D. PERITONEUM, LEFT SIDEWALL, BIOPSY:  -  Benign  fibrovascular tissue with chronic inflammation  -  No carcinoma identified   E. LYMPH NODE, SENTINEL, LEFT EXTERNAL ILIAC, BIOPSY:  -  No carcinoma identified in one lymph node (0/1)  -  See comment   F. UTERUS, CERVIX, BILATERAL FALLOPIAN TUBES AND OVARIES:   Uterus:  -  Endometrioid carcinoma, FIGO grade 2  -  Leiomyomata (1.6 cm ; largest)  -  Carcinoma present in lymphoid aggregate in serosal adhesions  (lymphovascular space invasion)  -  See oncology table and comment below   Cervix:  -  No carcinoma identified   Bilateral Ovaries:  -  No carcinoma identified   Bilateral Fallopian tubes:  -  Carcinoma within lumen of fallopian tube  (left)   G. PERIURETHRAL, BIOPSY:  -  No carcinoma identified    03/27/2020 Surgery   Robotic-assisted laparoscopic total hysterectomy with bilateral salpingoophorectomy, SLN biopsy   On EUA, very narrow introitus most due to tight hymenal ring. Small mobile uterus. Hyperpigmented 0.7m plaque on right mons, biopsied. Urethral overall normal in appearance, given hyperemic appearance in clinic, biopsy taken. On intra-abdominal entry, some scarring noted on inferior aspect of the liver, anterior right diaphragm and left lobe of the liver. Omentum normal appearing. Mesentery of the small and large bowel as well as the bowel itself studded with 1-233minflammatory-appearing nodules. Appendix with same studding, otherwise  normal in appearance. Uterus 6cm and normal appearing with the exception of inflammatory exudate on posterior aspect of the fundus. Bilateral adnexa normal appearing. Chocolate brown staining within most of the cul-de-sac. Inflammatory appearing nodules studding bilateral pelvic walls, anterior and posterior cul-de-sac c/w endometriosis. No adenopathy. No intra-abdominal or pelvic evidence of disease.   03/2020 Initial Biopsy   EMB - gr 1-2 EMCA   03/27/2020 Cancer Staging   Staging form: Corpus Uteri - Carcinoma and Carcinosarcoma, AJCC  8th Edition - Clinical stage from 03/27/2020: FIGO Stage IIIA (cT3a, cN0(sn), cM0) - Signed by [name], MD on 04/24/2020   04/23/2020 Imaging   Status post hysterectomy and bilateral salpingo-oophorectomy.   Mild stranding along the anterior transverse mesocolon. No frank peritoneal nodularity or omental caking. Attention on follow-up is suggested.   No findings specific for recurrent or metastatic disease.   Bladder is mildly thick-walled although underdistended.     04/27/2020 -  Chemotherapy   The patient had carboplatin and taxol for chemotherapy treatment.       REVIEW OF SYSTEMS:   Constitutional: Denies fevers, chills or abnormal weight loss Eyes: Denies blurriness of vision Cardiovascular: Denies palpitation, chest discomfort or lower extremity swelling Skin: Denies abnormal skin rashes Lymphatics: Denies new lymphadenopathy or easy bruising Behavioral/Psych: Mood is stable, no new changes  All other systems were reviewed with the patient and are negative.  I have reviewed the past medical history, past surgical history, social history and family history with the patient and they are unchanged from previous note.  ALLERGIES:  is allergic to ceftin [cefuroxime], crestor [rosuvastatin calcium], and latex.  MEDICATIONS:  Current Outpatient Medications  Medication Sig Dispense Refill  . Calcium Carb-Cholecalciferol (CALCIUM-VITAMIN D3) 600-400 MG-UNIT TABS Take 1 tablet by mouth daily.    Marland Kitchen CALCIUM PO Take by mouth. (Patient not taking: Reported on 03/16/2020)    . Cholecalciferol (VITAMIN D) 125 MCG (5000 UT) CAPS Take 5,000 Units by mouth daily.    Marland Kitchen dexamethasone (DECADRON) 4 MG tablet Take 2 tabs at the night before and 2 tab the morning of chemotherapy, every 3 weeks, by mouth x 6 cycles 36 tablet 6  . ibuprofen (ADVIL) 800 MG tablet Take 1 tablet (800 mg total) by mouth every 8 (eight) hours as needed for moderate pain. For AFTER surgery only 30 tablet 0  . magic  mouthwash w/lidocaine SOLN Take 5 mLs by mouth 4 (four) times daily. 240 mL 0  . Omega-3 Fatty Acids (FISH OIL PO) Take by mouth. (Patient not taking: Reported on 03/16/2020)    . ondansetron (ZOFRAN) 8 MG tablet Take 1 tablet (8 mg total) by mouth every 8 (eight) hours as needed. Start on the third day after chemotherapy. 30 tablet 1  . oxyCODONE (OXY IR/ROXICODONE) 5 MG immediate release tablet Take 1 tablet (5 mg total) by mouth every 4 (four) hours as needed for severe pain. For AFTER surgery, do not take and drive 10 tablet 0  . pantoprazole (PROTONIX) 40 MG tablet Take 40 mg by mouth daily as needed (acid reduction).     . prochlorperazine (COMPAZINE) 10 MG tablet Take 1 tablet (10 mg total) by mouth every 6 (six) hours as needed (Nausea or vomiting). 30 tablet 1  . senna-docusate (SENOKOT-S) 8.6-50 MG tablet Take 2 tablets by mouth at bedtime. For AFTER surgery, do not take if having diarrhea 30 tablet 0  . VITAMIN E PO Take by mouth. (Patient not taking: Reported on 03/16/2020)     No  current facility-administered medications for this visit.    PHYSICAL EXAMINATION: ECOG PERFORMANCE STATUS: 1 - Symptomatic but completely ambulatory  Vitals:   06/12/20 1029  BP: 118/74  Pulse: 79  Resp: 18  Temp: 97.8 F (36.6 C)  SpO2: 100%   Filed Weights   06/12/20 1029  Weight: 142 lb 3.2 oz (64.5 kg)    GENERAL:alert, no distress and comfortable SKIN: skin color, texture, turgor are normal, no rashes or significant lesions EYES: normal, Conjunctiva are pink and non-injected, sclera clear OROPHARYNX:no exudate, no erythema and lips, buccal mucosa, and tongue normal  NECK: supple, thyroid normal size, non-tender, without nodularity LYMPH:  no palpable lymphadenopathy in the cervical, axillary or inguinal LUNGS: clear to auscultation and percussion with normal breathing effort HEART: regular rate & rhythm and no murmurs and no lower extremity edema ABDOMEN:abdomen soft, non-tender and normal  bowel sounds Musculoskeletal:no cyanosis of digits and no clubbing  NEURO: alert & oriented x 3 with fluent speech, no focal motor/sensory deficits  LABORATORY DATA:  I have reviewed the data as listed    Component Value Date/Time   NA 140 06/12/2020 1009   K 3.9 06/12/2020 1009   CL 106 06/12/2020 1009   CO2 25 06/12/2020 1009   GLUCOSE 126 (H) 06/12/2020 1009   BUN 19 06/12/2020 1009   CREATININE 0.78 06/12/2020 1009   CREATININE 0.89 12/04/2015 0901   CALCIUM 9.9 06/12/2020 1009   PROT 7.5 06/12/2020 1009   ALBUMIN 4.2 06/12/2020 1009   AST 45 (H) 06/12/2020 1009   ALT 47 (H) 06/12/2020 1009   ALKPHOS 80 06/12/2020 1009   BILITOT 0.3 06/12/2020 1009   GFRNONAA >60 06/12/2020 1009   GFRAA >60 05/18/2020 0941    No results found for: SPEP, UPEP  Lab Results  Component Value Date   WBC 9.6 06/12/2020   NEUTROABS 7.5 06/12/2020   HGB 11.8 (L) 06/12/2020   HCT 34.5 (L) 06/12/2020   MCV 84.4 06/12/2020   PLT 228 06/12/2020      Chemistry      Component Value Date/Time   NA 140 06/12/2020 1009   K 3.9 06/12/2020 1009   CL 106 06/12/2020 1009   CO2 25 06/12/2020 1009   BUN 19 06/12/2020 1009   CREATININE 0.78 06/12/2020 1009   CREATININE 0.89 12/04/2015 0901      Component Value Date/Time   CALCIUM 9.9 06/12/2020 1009   ALKPHOS 80 06/12/2020 1009   AST 45 (H) 06/12/2020 1009   ALT 47 (H) 06/12/2020 1009   BILITOT 0.3 06/12/2020 1009

## 2020-06-13 NOTE — Assessment & Plan Note (Signed)
This is due to treatment Plan to adjust dose as above

## 2020-06-13 NOTE — Assessment & Plan Note (Signed)
Overall, she tolerated treatment well except for some shortness of breath on exertion, constipation and mild progressive peripheral neuropathy She also have experience of mild mucositis I plan mild dose adjustment I spent a lot of time reassuring the patient

## 2020-06-13 NOTE — Assessment & Plan Note (Signed)
she has mild peripheral neuropathy, likely related to side effects of treatment. °I plan to reduce the dose of treatment as outlined above.  °I explained to the patient the rationale of this strategy and reassured the patient it would not compromise the efficacy of treatment ° °

## 2020-06-13 NOTE — Assessment & Plan Note (Signed)
We have extensive discussions about the role of laxative therapy

## 2020-06-13 NOTE — Assessment & Plan Note (Signed)

## 2020-06-13 NOTE — Assessment & Plan Note (Signed)
She has symptoms of deconditioning We discussed extensively about the timing when she returns back to work and accommodation from workplace The patient has significant anxiety over the prospect of losing her job while undergoing chemotherapy I have tried my best to reassure the patient and will help her with whatever paperwork required to ensure that she does not get fired while undergoing chemotherapy

## 2020-06-19 ENCOUNTER — Telehealth: Payer: Self-pay | Admitting: Oncology

## 2020-06-19 NOTE — Telephone Encounter (Signed)
Left a message regarding radiation treatments.  Requested a return call.

## 2020-06-20 ENCOUNTER — Telehealth: Payer: Self-pay | Admitting: Oncology

## 2020-06-20 NOTE — Telephone Encounter (Signed)
Emily [name] called and said she doesn't think [employer] received her FMLA paperwork from 06/08/2020 saying that she will not be going to back to work on 06/21/20.  She is wondering when [facility] RN faxed the paperwork and if she can get a copy with the fax confirmation.  Voice mail message left for Roz to contact [place].

## 2020-06-20 NOTE — Telephone Encounter (Signed)
[name] that we do have the paperwork from [employer] and that I will make sure that it is completed and that I will call her in the morning.

## 2020-06-21 ENCOUNTER — Telehealth: Payer: Self-pay | Admitting: Oncology

## 2020-06-21 ENCOUNTER — Encounter: Payer: Self-pay | Admitting: Hematology and Oncology

## 2020-06-21 NOTE — Telephone Encounter (Signed)
Azriel left 3 voicemail messages about the [employer] fax numbers.  She said there are two different fax numbers and that we should send the paperwork to both.

## 2020-06-22 ENCOUNTER — Telehealth: Payer: Self-pay | Admitting: Oncology

## 2020-06-22 NOTE — Telephone Encounter (Signed)
Emily [name] called and asked when she can pick up her FMLA paperwork today.  Advised her that Roz, RN is working on sending it and we will let her know when it is ready.  Also reviewed first [facility]DR treatment appointments on 06/28/20.  She verbalized understanding and agreement.

## 2020-06-22 NOTE — Telephone Encounter (Signed)
Connected with Yancey Flemings.  Advised forms at front desk ready for pick up in reference to her message left this morning "requesting pickup to review as she may need to re-send if needed".    Forms completed today.    Faxed "[employer]" Disability to [phone].   [employer] certificate and ADA forms faxed to 1. Presley Raddle Fax : [phone] and  2. Haskel Schroeder Fax: [phone]  as instructed by patient through voicemail left 06/21/2020 at 0810 with instructions not to fax to [place] number on forms.

## 2020-06-25 ENCOUNTER — Telehealth: Payer: Self-pay | Admitting: Oncology

## 2020-06-25 NOTE — Telephone Encounter (Signed)
Ahana called and said that Crystal at [facility] has received the paperwork.

## 2020-06-27 ENCOUNTER — Telehealth: Payer: Self-pay | Admitting: *Deleted

## 2020-06-27 NOTE — Progress Notes (Signed)
Radiation Oncology         [phone] ________________________________  Name: Emily [name] MRN: [id number]  Date: 06/28/2020  DOB: 04-23-1961  Vaginal Brachytherapy Procedure Note  CC: [name], MD [name], MD    ICD-10-CM   1. Endometrial cancer ([place])  C54.1   2. Flu vaccine need  Z23 influenza vac split quadrivalent PF (FLUARIX) injection 0.5 mL    Diagnosis: Stage IIIA (pT3a, pN0) endometrioid endometrial adenocarcinoma, FIGO grade 2  Radiation Treatment Dates: 06/28/2020, 07/09/2020, 07/12/2020, 07/16/2020, and 07/19/2020  Narrative: She returns today for vaginal cylinder fitting. She was seen in consultation on 05/23/2020, during which time she was to continue with additional chemotherapy followed by vaginal brachytherapy after one month. Overall, she has tolerated chemotherapy relatively well thus far with the exception of shortness of breath with exertion, constipation, mild mucositis, and mild progressive peripheral neuropathy. Dose adjustment was discussed.  On review of systems, she reports improvement in her soreness in the distal vaginal area. She denies hematuria.  She has minimal vaginal discharge at this time  ALLERGIES: is allergic to ceftin [cefuroxime], crestor [rosuvastatin calcium], and latex.  Meds: Current Outpatient Medications  Medication Sig Dispense Refill  . Calcium Carb-Cholecalciferol (CALCIUM-VITAMIN D3) 600-400 MG-UNIT TABS Take 1 tablet by mouth daily.    Marland Kitchen CALCIUM PO Take by mouth. (Patient not taking: Reported on 06/28/2020)    . Cholecalciferol (VITAMIN D) 125 MCG (5000 UT) CAPS Take 5,000 Units by mouth daily.    Marland Kitchen dexamethasone (DECADRON) 4 MG tablet Take 2 tabs at the night before and 2 tab the morning of chemotherapy, every 3 weeks, by mouth x 6 cycles 36 tablet 6  . ibuprofen (ADVIL) 800 MG tablet Take 1 tablet (800 mg total) by mouth every 8 (eight) hours as needed for moderate pain. For AFTER surgery only 30 tablet 0    . lidocaine (XYLOCAINE) 2 % solution Use as directed 5 mLs in the mouth or throat every 6 (six) hours as needed.    . magic mouthwash SOLN Take 5 mLs by mouth every 6 (six) hours as needed. Swish and swallow 5 mLs by mouth as needed every 4-6 hours (Patient not taking: Reported on 06/28/2020)    . magic mouthwash w/lidocaine SOLN Take 5 mLs by mouth 4 (four) times daily. (Patient not taking: Reported on 06/28/2020) 240 mL 0  . Omega-3 Fatty Acids (FISH OIL PO) Take by mouth. (Patient not taking: Reported on 03/16/2020)    . ondansetron (ZOFRAN) 8 MG tablet Take 1 tablet (8 mg total) by mouth every 8 (eight) hours as needed. Start on the third day after chemotherapy. 30 tablet 1  . oxyCODONE (OXY IR/ROXICODONE) 5 MG immediate release tablet Take 1 tablet (5 mg total) by mouth every 4 (four) hours as needed for severe pain. For AFTER surgery, do not take and drive 10 tablet 0  . pantoprazole (PROTONIX) 40 MG tablet Take 40 mg by mouth daily as needed (acid reduction).     . prochlorperazine (COMPAZINE) 10 MG tablet Take 1 tablet (10 mg total) by mouth every 6 (six) hours as needed (Nausea or vomiting). 30 tablet 1  . senna-docusate (SENOKOT-S) 8.6-50 MG tablet Take 2 tablets by mouth at bedtime. For AFTER surgery, do not take if having diarrhea 30 tablet 0  . VITAMIN E PO Take by mouth. (Patient not taking: Reported on 03/16/2020)     No current facility-administered medications for this encounter.    Physical Findings: The patient is  in no acute distress. Patient is alert and oriented.  height is 5\' 4"  (1.626 m) and weight is 145 lb (65.8 kg). Her temperature is 97.5 F (36.4 C) (abnormal). Her blood pressure is 106/74 and her pulse is 64. Her respiration is 18 and oxygen saturation is 100%.   No palpable cervical, supraclavicular or axillary lymphoadenopathy. The heart has a regular rate and rhythm. The lungs are clear to auscultation. Abdomen soft and non-tender.  On pelvic examination the  external genitalia were unremarkable. A speculum exam was performed. Vaginal cuff intact, no mucosal lesions.  No sutures palpable or visible at this time.  on bimanual exam there were no pelvic masses appreciated or fluctuance. Significant dark brown vaginal discharge was noted in the proximal vagina.  After this was cleared with a Fox swab a good view of the vaginal cuff was noted.  No erythema to suggest infection.  No odor to suggest stool origin.  Lab Findings: Lab Results  Component Value Date   WBC 9.6 06/12/2020   HGB 11.8 (L) 06/12/2020   HCT 34.5 (L) 06/12/2020   MCV 84.4 06/12/2020   PLT 228 06/12/2020    Radiographic Findings: No results found.  Impression: Stage IIIA (pT3a, pN0) endometrioid endometrial adenocarcinoma, FIGO grade 2  Patient was fitted for a vaginal cylinder. The patient will be treated with a 2.5 cm diameter cylinder with a treatment length of 3.0 cm. This distended the vaginal vault without undue discomfort. The patient tolerated the procedure well.  The patient was successfully fitted for a vaginal cylinder. The patient is appropriate to begin vaginal brachytherapy.   Plan: The patient will proceed with CT simulation and vaginal brachytherapy today.  After discussion with the patient concerning  the dark brown vaginal discharge, the patient and I feel she should hold off on initiating vaginal brachytherapy until she is examined by [name]. She will see [name] on October 25 for pelvic exam. She will also proceed with additional chemotherapy next week and then initiate brachytherapy first week in November if cleared by Gyn/Onc.  Addendum: Treatment planning pelvic CT scan today did not show anything unusual in the pelvis/vaginal cuff region.  _______________________________   Blair Promise, PhD, MD  This document serves as a record of services personally performed by Gery Pray, MD. It was created on his behalf by Clerance Lav, a trained medical  scribe. The creation of this record is based on the scribe's personal observations and the provider's statements to them. This document has been checked and approved by the attending provider.

## 2020-06-27 NOTE — Telephone Encounter (Signed)
CALLED PATIENT TO REMIND OF NEW HDR [place] FOR 06-28-20, LVM FOR A RETURN CALL

## 2020-06-27 NOTE — Telephone Encounter (Signed)
CALLED PATIENT TO REMIND OF NEW [facility]DR [facility], LVM FOR A RETURN CALL ?

## 2020-06-28 ENCOUNTER — Ambulatory Visit
Admission: RE | Admit: 2020-06-28 | Discharge: 2020-06-28 | Disposition: A | Discharge status: Home / Self Care | Payer: 59 | Source: Ambulatory Visit | Attending: Radiation Oncology | Admitting: Radiation Oncology

## 2020-06-28 ENCOUNTER — Other Ambulatory Visit: Payer: Self-pay

## 2020-06-28 ENCOUNTER — Telehealth: Payer: Self-pay | Admitting: Oncology

## 2020-06-28 VITALS — BP 106/74 | HR 64 | Temp 97.5°F | Resp 18 | Ht 64.0 in | Wt 145.0 lb

## 2020-06-28 DIAGNOSIS — C541 Malignant neoplasm of endometrium: Secondary | ICD-10-CM | POA: Insufficient documentation

## 2020-06-28 DIAGNOSIS — Z7952 Long term (current) use of systemic steroids: Secondary | ICD-10-CM | POA: Diagnosis not present

## 2020-06-28 DIAGNOSIS — Z23 Encounter for immunization: Secondary | ICD-10-CM | POA: Diagnosis not present

## 2020-06-28 DIAGNOSIS — Z791 Long term (current) use of non-steroidal anti-inflammatories (NSAID): Secondary | ICD-10-CM | POA: Insufficient documentation

## 2020-06-28 MED ORDER — INFLUENZA VAC SPLIT QUAD 0.5 ML IM SUSY
0.5000 mL | PREFILLED_SYRINGE | Freq: Once | INTRAMUSCULAR | Status: AC
  Administered 2020-06-28: 0.5 mL via INTRAMUSCULAR
  Filled 2020-06-28: qty 0.5

## 2020-06-28 NOTE — Telephone Encounter (Signed)
Emily [name] and scheduled appointment to see [name] on Monday, 07/02/20 per Dr. Sondra Come for an exam.  She verbalized understanding and agreement.

## 2020-07-02 ENCOUNTER — Encounter: Payer: Self-pay | Admitting: Gynecologic Oncology

## 2020-07-02 ENCOUNTER — Other Ambulatory Visit: Payer: Self-pay

## 2020-07-02 ENCOUNTER — Inpatient Hospital Stay (HOSPITAL_BASED_OUTPATIENT_CLINIC_OR_DEPARTMENT_OTHER): Payer: 59 | Admitting: Gynecologic Oncology

## 2020-07-02 VITALS — BP 114/73 | HR 73 | Temp 98.7°F | Resp 18 | Ht 64.0 in | Wt 143.0 lb

## 2020-07-02 DIAGNOSIS — Z5111 Encounter for antineoplastic chemotherapy: Secondary | ICD-10-CM | POA: Diagnosis not present

## 2020-07-02 DIAGNOSIS — N898 Other specified noninflammatory disorders of vagina: Secondary | ICD-10-CM

## 2020-07-02 DIAGNOSIS — Z87440 Personal history of urinary (tract) infections: Secondary | ICD-10-CM | POA: Diagnosis not present

## 2020-07-02 DIAGNOSIS — C541 Malignant neoplasm of endometrium: Secondary | ICD-10-CM | POA: Diagnosis not present

## 2020-07-02 DIAGNOSIS — G620 Drug-induced polyneuropathy: Secondary | ICD-10-CM | POA: Diagnosis not present

## 2020-07-02 DIAGNOSIS — R748 Abnormal levels of other serum enzymes: Secondary | ICD-10-CM | POA: Diagnosis not present

## 2020-07-02 DIAGNOSIS — K219 Gastro-esophageal reflux disease without esophagitis: Secondary | ICD-10-CM | POA: Diagnosis not present

## 2020-07-02 DIAGNOSIS — F419 Anxiety disorder, unspecified: Secondary | ICD-10-CM | POA: Diagnosis not present

## 2020-07-02 DIAGNOSIS — D6481 Anemia due to antineoplastic chemotherapy: Secondary | ICD-10-CM | POA: Diagnosis not present

## 2020-07-02 DIAGNOSIS — K5909 Other constipation: Secondary | ICD-10-CM | POA: Diagnosis not present

## 2020-07-02 NOTE — Patient Instructions (Signed)
Dr Denman George does not see a source for your brown discharge today. However, it is reassuring that the top of the vagina is well healed and therefore it is safe to start radiation therapy.

## 2020-07-02 NOTE — Progress Notes (Signed)
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  I do not see a clear cause for her brown discharge.  There is no apparent fistula.  The cuff is well healed.  It seems that this symptom has been going on for a long time and preceded her diagnosis of endometrial cancer.  Therefore while I do not see a clear source or explanation for the brown discharge, I do not see a contraindication to proceeding with vaginal brachytherapy (such as a nonhealing vaginal cuff).  I explained to the patient that I understood her frustration with our lack of knowledge and understanding of the underlying cause of this persistent symptom, however she has had extensive work-up with examinations under anesthesia, hysterectomy, all of which have been unremarkable.  CT imaging also does not show a clear explanation for this.  Potentially in the future she could benefit from an MRI.  I do not think that this necessarily needs to move forward at this point in time as it will not or should not impact her current therapies.  Therefore I see no reason why she should not proceed with vaginal brachytherapy at Dr. Clabe Seal discretion.  HPI: Emily [name] is a 59 year old woman who has a history of stage IIIa endometrioid adenocarcinoma of the endometrium FIGO grade 2.  She is status post surgical staging performed on March 27, 2020.  That surgery revealed a FIGO grade 2 endometrioid adenocarcinoma with lymphovascular space invasion present.  There was carcinoma within the lumen of the fallopian tube.  Sentinel lymph nodes were negative.  At the time of surgical staging she was noted to have inflammatory appearing nodules on the mesentery and bowel, appendix,  uterus, and chocolate brown staining of the cul-de-sac consistent with endometriosis.  Of note final pathology did not comment on the finding of endometriosis microscopically.  Due to her stage IIIa disease with positive disease in the fallopian tube and high risk uterine features she was recommended to receive adjuvant therapy with 6 cycles of carboplatin paclitaxel and consideration for vaginal brachytherapy.  She commenced chemotherapy on April 27, 2020 and was being evaluated for possible commencement of vaginal brachytherapy on June 28, 2020 when her treating radiation oncologist noted brown discharge in the vagina.  Interval Hx: The patient reported that she had a history of brown vaginal discharge that preceded her diagnosis of endometrial cancer.  She had been worked up before by multiple providers including a general gynecologist and your gynecologist.  She had been tested for yeast and treated with Monistat with multiple failed improvements.  She had been treated with vaginal estrogen but this did not improve her symptoms.  She has been tested for STIs all of which were negative.  No clear source of the discharge had been determined including during examination under anesthesia, surgery for hysterectomy, and postoperative imaging.  The brown vaginal discharge became somewhat worse in the postoperative period.  PMH: as previously noted PSH: as previously noted   Current Outpatient Medications:  .  Calcium Carb-Cholecalciferol (CALCIUM-VITAMIN D3) 600-400 MG-UNIT TABS, Take 1 tablet by mouth daily., Disp: , Rfl:  .  CALCIUM PO, Take by mouth. , Disp: , Rfl:  .  Cholecalciferol (VITAMIN D) 125 MCG (5000 UT) CAPS, Take 5,000 Units by mouth daily.,  Disp: , Rfl:  .  dexamethasone (DECADRON) 4 MG tablet, Take 2 tabs at the night before and 2 tab the morning of chemotherapy, every 3 weeks, by mouth x 6 cycles, Disp: 36 tablet, Rfl: 6 .  famotidine (PEPCID) 20 MG tablet, Take 20 mg by mouth 2  (two) times daily as needed for heartburn or indigestion., Disp: , Rfl:  .  ibuprofen (ADVIL) 800 MG tablet, Take 1 tablet (800 mg total) by mouth every 8 (eight) hours as needed for moderate pain. For AFTER surgery only, Disp: 30 tablet, Rfl: 0 .  lidocaine (XYLOCAINE) 2 % solution, Use as directed 5 mLs in the mouth or throat every 6 (six) hours as needed., Disp: , Rfl:  .  magic mouthwash w/lidocaine SOLN, Take 5 mLs by mouth 4 (four) times daily., Disp: 240 mL, Rfl: 0 .  Omega-3 Fatty Acids (FISH OIL PO), Take by mouth. , Disp: , Rfl:  .  ondansetron (ZOFRAN) 8 MG tablet, Take 1 tablet (8 mg total) by mouth every 8 (eight) hours as needed. Start on the third day after chemotherapy., Disp: 30 tablet, Rfl: 1 .  oxyCODONE (OXY IR/ROXICODONE) 5 MG immediate release tablet, Take 1 tablet (5 mg total) by mouth every 4 (four) hours as needed for severe pain. For AFTER surgery, do not take and drive, Disp: 10 tablet, Rfl: 0 .  pantoprazole (PROTONIX) 40 MG tablet, Take 40 mg by mouth daily as needed (acid reduction). , Disp: , Rfl:  .  polyethylene glycol (MIRALAX / GLYCOLAX) 17 g packet, Take 17 g by mouth daily as needed., Disp: , Rfl:  .  prochlorperazine (COMPAZINE) 10 MG tablet, Take 1 tablet (10 mg total) by mouth every 6 (six) hours as needed (Nausea or vomiting)., Disp: 30 tablet, Rfl: 1 .  senna-docusate (SENOKOT-S) 8.6-50 MG tablet, Take 2 tablets by mouth at bedtime. For AFTER surgery, do not take if having diarrhea, Disp: 30 tablet, Rfl: 0 .  VITAMIN E PO, Take by mouth. , Disp: , Rfl:    ROS:  See HPI, all else negative.  Physical Exam: BP 114/73 (BP Location: Right Arm, Patient Position: Sitting)   Pulse 73   Temp 98.7 F (37.1 C) (Tympanic)   Resp 18   Ht 5\' 4"  (1.626 m)   Wt 143 lb (64.9 kg)   SpO2 100% Comment: RA  BMI 24.55 kg/m   WD in NAD Neck  Supple NROM, without any enlargements.  Lymph Node Survey No cervical supraclavicular or inguinal adenopathy Cardiovascular   Well perfused peripheries  Lungs  No increased WOB Skin  No rash/lesions/breakdown  Psychiatry  Alert and oriented to person, place, and time  Abdomen  Normoactive bowel sounds, abdomen soft, non-tender and thin without evidence of hernia. Back No CVA tenderness Genito Urinary  Vulva/vagina: Normal external female genitalia.  No lesions. No discharge or bleeding.  Bladder/urethra:  No lesions or masses, well supported bladder. Slightly prominent urethral meatus but free of lesions.  Vagina: There is a small amount of dark brown discharge that looks most compatible with old blood that is present within the vagina.  The entire vagina including the vaginal fornices were closely inspected and probed with colpostat.  No breach in the vaginal cuff was noted.  No source for the brown discharge was appreciated.  There was no new discharge elucidated during the exam.  The vaginal cuff itself was completely healed with no suture material present and no active bleeding. Rectal  Good tone, no masses  no cul de sac nodularity.  Extremities  No bilateral cyanosis, clubbing or edema.  Thereasa Solo, MD

## 2020-07-03 ENCOUNTER — Encounter: Payer: Self-pay | Admitting: Hematology and Oncology

## 2020-07-03 ENCOUNTER — Other Ambulatory Visit: Payer: Self-pay

## 2020-07-03 ENCOUNTER — Inpatient Hospital Stay (HOSPITAL_BASED_OUTPATIENT_CLINIC_OR_DEPARTMENT_OTHER): Payer: 59 | Admitting: Hematology and Oncology

## 2020-07-03 ENCOUNTER — Inpatient Hospital Stay: Payer: 59

## 2020-07-03 DIAGNOSIS — K5909 Other constipation: Secondary | ICD-10-CM | POA: Diagnosis not present

## 2020-07-03 DIAGNOSIS — D6481 Anemia due to antineoplastic chemotherapy: Secondary | ICD-10-CM | POA: Diagnosis not present

## 2020-07-03 DIAGNOSIS — C541 Malignant neoplasm of endometrium: Secondary | ICD-10-CM

## 2020-07-03 DIAGNOSIS — T451X5A Adverse effect of antineoplastic and immunosuppressive drugs, initial encounter: Secondary | ICD-10-CM

## 2020-07-03 DIAGNOSIS — R748 Abnormal levels of other serum enzymes: Secondary | ICD-10-CM | POA: Diagnosis not present

## 2020-07-03 DIAGNOSIS — K219 Gastro-esophageal reflux disease without esophagitis: Secondary | ICD-10-CM | POA: Diagnosis not present

## 2020-07-03 DIAGNOSIS — F419 Anxiety disorder, unspecified: Secondary | ICD-10-CM | POA: Diagnosis not present

## 2020-07-03 DIAGNOSIS — G620 Drug-induced polyneuropathy: Secondary | ICD-10-CM | POA: Diagnosis not present

## 2020-07-03 DIAGNOSIS — Z87440 Personal history of urinary (tract) infections: Secondary | ICD-10-CM | POA: Diagnosis not present

## 2020-07-03 DIAGNOSIS — Z5111 Encounter for antineoplastic chemotherapy: Secondary | ICD-10-CM | POA: Diagnosis not present

## 2020-07-03 LAB — CBC WITH DIFFERENTIAL (CANCER CENTER ONLY)
Abs Immature Granulocytes: 0.06 10*3/uL (ref 0.00–0.07)
Basophils Absolute: 0 10*3/uL (ref 0.0–0.1)
Basophils Relative: 1 %
Eosinophils Absolute: 0 10*3/uL (ref 0.0–0.5)
Eosinophils Relative: 0 %
HCT: 34.7 % — ABNORMAL LOW (ref 36.0–46.0)
Hemoglobin: 11.6 g/dL — ABNORMAL LOW (ref 12.0–15.0)
Immature Granulocytes: 1 %
Lymphocytes Relative: 16 %
Lymphs Abs: 1.4 10*3/uL (ref 0.7–4.0)
MCH: 28.6 pg (ref 26.0–34.0)
MCHC: 33.4 g/dL (ref 30.0–36.0)
MCV: 85.7 fL (ref 80.0–100.0)
Monocytes Absolute: 0.5 10*3/uL (ref 0.1–1.0)
Monocytes Relative: 5 %
Neutro Abs: 6.9 10*3/uL (ref 1.7–7.7)
Neutrophils Relative %: 77 %
Platelet Count: 150 10*3/uL (ref 150–400)
RBC: 4.05 MIL/uL (ref 3.87–5.11)
RDW: 15.9 % — ABNORMAL HIGH (ref 11.5–15.5)
WBC Count: 8.8 10*3/uL (ref 4.0–10.5)
nRBC: 0 % (ref 0.0–0.2)

## 2020-07-03 LAB — CMP (CANCER CENTER ONLY)
ALT: 39 U/L (ref 0–44)
AST: 28 U/L (ref 15–41)
Albumin: 4.3 g/dL (ref 3.5–5.0)
Alkaline Phosphatase: 82 U/L (ref 38–126)
Anion gap: 12 (ref 5–15)
BUN: 20 mg/dL (ref 6–20)
CO2: 22 mmol/L (ref 22–32)
Calcium: 9.9 mg/dL (ref 8.9–10.3)
Chloride: 106 mmol/L (ref 98–111)
Creatinine: 0.83 mg/dL (ref 0.44–1.00)
GFR, Estimated: >60 mL/min (ref >60)
Glucose, Bld: 184 mg/dL — ABNORMAL HIGH (ref 70–99)
Potassium: 3.6 mmol/L (ref 3.5–5.1)
Sodium: 140 mmol/L (ref 135–145)
Total Bilirubin: 0.2 mg/dL — ABNORMAL LOW (ref 0.3–1.2)
Total Protein: 7.4 g/dL (ref 6.5–8.1)

## 2020-07-03 MED ORDER — FAMOTIDINE IN NACL 20-0.9 MG/50ML-% IV SOLN
20.0000 mg | Freq: Once | INTRAVENOUS | Status: AC
  Administered 2020-07-03: 20 mg via INTRAVENOUS

## 2020-07-03 MED ORDER — FAMOTIDINE IN NACL 20-0.9 MG/50ML-% IV SOLN
INTRAVENOUS | Status: AC
  Filled 2020-07-03: qty 50

## 2020-07-03 MED ORDER — PALONOSETRON HCL INJECTION 0.25 MG/5ML
INTRAVENOUS | Status: AC
  Filled 2020-07-03: qty 5

## 2020-07-03 MED ORDER — SODIUM CHLORIDE 0.9 % IV SOLN
150.0000 mg | Freq: Once | INTRAVENOUS | Status: AC
  Administered 2020-07-03: 150 mg via INTRAVENOUS
  Filled 2020-07-03: qty 150

## 2020-07-03 MED ORDER — PALONOSETRON HCL INJECTION 0.25 MG/5ML
0.2500 mg | Freq: Once | INTRAVENOUS | Status: AC
  Administered 2020-07-03: 0.25 mg via INTRAVENOUS

## 2020-07-03 MED ORDER — SODIUM CHLORIDE 0.9 % IV SOLN
140.0000 mg/m2 | Freq: Once | INTRAVENOUS | Status: AC
  Administered 2020-07-03: 240 mg via INTRAVENOUS
  Filled 2020-07-03: qty 40

## 2020-07-03 MED ORDER — DIPHENHYDRAMINE HCL 50 MG/ML IJ SOLN
INTRAMUSCULAR | Status: AC
  Filled 2020-07-03: qty 1

## 2020-07-03 MED ORDER — SODIUM CHLORIDE 0.9 % IV SOLN
10.0000 mg | Freq: Once | INTRAVENOUS | Status: AC
  Administered 2020-07-03: 10 mg via INTRAVENOUS
  Filled 2020-07-03: qty 10

## 2020-07-03 MED ORDER — DIPHENHYDRAMINE HCL 50 MG/ML IJ SOLN
12.5000 mg | Freq: Once | INTRAMUSCULAR | Status: AC
  Administered 2020-07-03: 12.5 mg via INTRAVENOUS

## 2020-07-03 MED ORDER — SODIUM CHLORIDE 0.9 % IV SOLN
615.4550 mg | Freq: Once | INTRAVENOUS | Status: AC
  Administered 2020-07-03: 620 mg via INTRAVENOUS
  Filled 2020-07-03: qty 62

## 2020-07-03 MED ORDER — SODIUM CHLORIDE 0.9 % IV SOLN
Freq: Once | INTRAVENOUS | Status: AC
  Filled 2020-07-03: qty 250

## 2020-07-03 NOTE — Assessment & Plan Note (Signed)
This is likely due to recent treatment. The patient denies recent history of bleeding such as epistaxis, hematuria or hematochezia. She is asymptomatic from the anemia. I will observe for now.   

## 2020-07-03 NOTE — Progress Notes (Signed)
[facility] OFFICE PROGRESS NOTE  Patient Care Team: [name], MD as PCP - General (Family Medicine)  ASSESSMENT & PLAN:  Endometrial cancer (Emily [name]) Overall, she tolerated treatment better with slight dose adjustment We will continue treatment as scheduled with similar dose adjustment as before  Anemia due to antineoplastic chemotherapy This is likely due to recent treatment. The patient denies recent history of bleeding such as epistaxis, hematuria or hematochezia. She is asymptomatic from the anemia. I will observe for now.   Peripheral neuropathy due to chemotherapy [facility]) This is stable since recent dose adjustment We will continue similar dose adjustment as before   No orders of the defined types were placed in this encounter.   All questions were answered. The patient knows to call the clinic with any problems, questions or concerns. The total time spent in the appointment was 20 minutes encounter with patients including review of chart and various tests results, discussions about plan of care and coordination of care plan   [name], MD 07/03/2020 11:17 AM  INTERVAL HISTORY: Please see below for problem oriented charting. She returns with her sister for further follow-up She tolerated recent chemotherapy better She denies worsening neuropathy No mucositis or constipation Her only symptom is fatigue, slightly worse than prior visit  SUMMARY OF ONCOLOGIC HISTORY: Oncology History Overview Note  [facility] MMR normal Endometrioid MSI-stable   Endometrial cancer (Emily [name])  02/28/2020 Initial Diagnosis   The patient presented for an exam on 6/22.  At that time she endorsed episodes of postmenopausal bleeding starting in 09-03-19 after her mother's death.  She had not had a menses in a number of years.  Patient's exam was limited by her intolerance in the position of the cervix.  Pap test was performed on 6/30 showing atypical glandular cells of undetermined  significance. 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 She describes about a year long history previously of clear vaginal discharge. She saw her PCP for this complaint and given what was felt to be urethral swelling, she was treated for a UTI (lab testing did not show an infection). She has not been sexually active since her 73s. She endorses increased urinary frequency. After a year, the clear discharge became yellow (and at one point looked green). The patient was seen again and a pap was attempted in the office but she was unable to tolerate this. She performed a vaginal swab on herself that found yeast. She used Monistat multiple times with improvement in her discharge. She then began to have pale pink discharge. At this time, she was seen by a Urogynecologist at [facility] and it sound like she had urodynamic testing. She describes a raw feeling and burning with her urine. She continues to have vaginal discharge which she notes has an ammonia-like smell. Her vaginal bleeding started again about a month ago. She briefly tried vaginal estrogen after seeing the Urogynecologist but she felt that the bleeding worsened as did the ammonia smell, so she stopped.   03/16/2020 Initial Diagnosis   Endometrial cancer ([place])   03/21/2020 Imaging   1. Abnormally thickened endometrial complex measuring up to 12 mm, presumably related to known history of endometrial carcinoma. 2. Underlying fibroid uterus as detailed above. 3. Normal sonographic evaluation of the ovaries. No abnormal free fluid. Please note that the appendix appears to be potentially located in close proximity to the right ovary on this exam. As this patient is scheduled to undergo total hysterectomy  with bilateral salpingo oophorectomy in the near future, close attention to this region at surgical intervention is recommended.     03/27/2020 Pathology Results   A. CUL DE SAC, ANTERIOR, BIOPSY:  -   Benign fibrovascular tissue with mesothelial hyperplasia  -  No carcinoma identified  -  See comment   B. MONS, RIGHT, BIOPSY:  -  Melanocytic nevus, intradermal type  -  See comment   C. LYMPH NODE, SENTINEL, RIGHT EXTERNAL ILIAC, BIOPSY:  -  No carcinoma identified in one lymph node (0/1)  -  See comment   D. PERITONEUM, LEFT SIDEWALL, BIOPSY:  -  Benign fibrovascular tissue with chronic inflammation  -  No carcinoma identified   E. LYMPH NODE, SENTINEL, LEFT EXTERNAL ILIAC, BIOPSY:  -  No carcinoma identified in one lymph node (0/1)  -  See comment   F. UTERUS, CERVIX, BILATERAL FALLOPIAN TUBES AND OVARIES:   Uterus:  -  Endometrioid carcinoma, FIGO grade 2  -  Leiomyomata (1.6 cm ; largest)  -  Carcinoma present in lymphoid aggregate in serosal adhesions  (lymphovascular space invasion)  -  See oncology table and comment below   Cervix:  -  No carcinoma identified   Bilateral Ovaries:  -  No carcinoma identified   Bilateral Fallopian tubes:  -  Carcinoma within lumen of fallopian tube  (left)   G. PERIURETHRAL, BIOPSY:  -  No carcinoma identified    03/27/2020 Surgery   Robotic-assisted laparoscopic total hysterectomy with bilateral salpingoophorectomy, SLN biopsy   On EUA, very narrow introitus most due to tight hymenal ring. Small mobile uterus. Hyperpigmented 0.22m plaque on right mons, biopsied. Urethral overall normal in appearance, given hyperemic appearance in clinic, biopsy taken. On intra-abdominal entry, some scarring noted on inferior aspect of the liver, anterior right diaphragm and left lobe of the liver. Omentum normal appearing. Mesentery of the small and large bowel as well as the bowel itself studded with 1-243minflammatory-appearing nodules. Appendix with same studding, otherwise normal in appearance. Uterus 6cm and normal appearing with the exception of inflammatory exudate on posterior aspect of the fundus. Bilateral adnexa normal appearing.  Chocolate brown staining within most of the cul-de-sac. Inflammatory appearing nodules studding bilateral pelvic walls, anterior and posterior cul-de-sac c/w endometriosis. No adenopathy. No intra-abdominal or pelvic evidence of disease.   03/2020 Initial Biopsy   EMB - gr 1-2 EMCA   03/27/2020 Cancer Staging   Staging form: Corpus Uteri - Carcinoma and Carcinosarcoma, AJCC 8th Edition - Clinical stage from 03/27/2020: FIGO Stage IIIA (cT3a, cN0(sn), cM0) - Signed by GoHeath LarkMD on 04/24/2020   04/23/2020 Imaging   Status post hysterectomy and bilateral salpingo-oophorectomy.   Mild stranding along the anterior transverse mesocolon. No frank peritoneal nodularity or omental caking. Attention on follow-up is suggested.   No findings specific for recurrent or metastatic disease.   Bladder is mildly thick-walled although underdistended.     04/27/2020 -  Chemotherapy   The patient had carboplatin and taxol for chemotherapy treatment.       REVIEW OF SYSTEMS:   Constitutional: Denies fevers, chills or abnormal weight loss Eyes: Denies blurriness of vision Ears, nose, mouth, throat, and face: Denies mucositis or sore throat Respiratory: Denies cough, dyspnea or wheezes Cardiovascular: Denies palpitation, chest discomfort or lower extremity swelling Gastrointestinal:  Denies nausea, heartburn or change in bowel habits Skin: Denies abnormal skin rashes Lymphatics: Denies new lymphadenopathy or easy bruising Behavioral/Psych: Mood is stable, no new changes  All other systems  were reviewed with the patient and are negative.  I have reviewed the past medical history, past surgical history, social history and family history with the patient and they are unchanged from previous note.  ALLERGIES:  is allergic to ceftin [cefuroxime], crestor [rosuvastatin calcium], and latex.  MEDICATIONS:  Current Outpatient Medications  Medication Sig Dispense Refill  . Calcium Carb-Cholecalciferol  (CALCIUM-VITAMIN D3) 600-400 MG-UNIT TABS Take 1 tablet by mouth daily.    Marland Kitchen CALCIUM PO Take by mouth.     . Cholecalciferol (VITAMIN D) 125 MCG (5000 UT) CAPS Take 5,000 Units by mouth daily.    Marland Kitchen dexamethasone (DECADRON) 4 MG tablet Take 2 tabs at the night before and 2 tab the morning of chemotherapy, every 3 weeks, by mouth x 6 cycles 36 tablet 6  . famotidine (PEPCID) 20 MG tablet Take 20 mg by mouth 2 (two) times daily as needed for heartburn or indigestion.    Marland Kitchen ibuprofen (ADVIL) 800 MG tablet Take 1 tablet (800 mg total) by mouth every 8 (eight) hours as needed for moderate pain. For AFTER surgery only 30 tablet 0  . lidocaine (XYLOCAINE) 2 % solution Use as directed 5 mLs in the mouth or throat every 6 (six) hours as needed.    . magic mouthwash w/lidocaine SOLN Take 5 mLs by mouth 4 (four) times daily. 240 mL 0  . Omega-3 Fatty Acids (FISH OIL PO) Take by mouth.     . ondansetron (ZOFRAN) 8 MG tablet Take 1 tablet (8 mg total) by mouth every 8 (eight) hours as needed. Start on the third day after chemotherapy. 30 tablet 1  . oxyCODONE (OXY IR/ROXICODONE) 5 MG immediate release tablet Take 1 tablet (5 mg total) by mouth every 4 (four) hours as needed for severe pain. For AFTER surgery, do not take and drive 10 tablet 0  . pantoprazole (PROTONIX) 40 MG tablet Take 40 mg by mouth daily as needed (acid reduction).     . polyethylene glycol (MIRALAX / GLYCOLAX) 17 g packet Take 17 g by mouth daily as needed.    . prochlorperazine (COMPAZINE) 10 MG tablet Take 1 tablet (10 mg total) by mouth every 6 (six) hours as needed (Nausea or vomiting). 30 tablet 1  . senna-docusate (SENOKOT-S) 8.6-50 MG tablet Take 2 tablets by mouth at bedtime. For AFTER surgery, do not take if having diarrhea 30 tablet 0  . VITAMIN E PO Take by mouth.      No current facility-administered medications for this visit.   Facility-Administered Medications Ordered in Other Visits  Medication Dose Route Frequency Provider  Last Rate Last Admin  . CARBOplatin (PARAPLATIN) 620 mg in sodium chloride 0.9 % 250 mL chemo infusion  620 mg Intravenous Once Alvy Bimler, Holger Sokolowski, MD      . dexamethasone (DECADRON) 10 mg in sodium chloride 0.9 % 50 mL IVPB  10 mg Intravenous Once Alvy Bimler, Kilah Drahos, MD      . diphenhydrAMINE (BENADRYL) injection 12.5 mg  12.5 mg Intravenous Once Alvy Bimler, Lauria Depoy, MD      . famotidine (PEPCID) IVPB 20 mg premix  20 mg Intravenous Once Alvy Bimler, Coreen Shippee, MD      . fosaprepitant (EMEND) 150 mg in sodium chloride 0.9 % 145 mL IVPB  150 mg Intravenous Once Miriana Gaertner, MD      . PACLitaxel (TAXOL) 240 mg in sodium chloride 0.9 % 250 mL chemo infusion (> 7m/m2)  140 mg/m2 (Treatment Plan Recorded) Intravenous Once GHeath Lark MD      .  palonosetron (ALOXI) injection 0.25 mg  0.25 mg Intravenous Once Alvy Bimler, Elie Leppo, MD        PHYSICAL EXAMINATION: ECOG PERFORMANCE STATUS: 1 - Symptomatic but completely ambulatory  Vitals:   07/03/20 1033  BP: 131/76  Pulse: 73  Resp: 18  Temp: 98.4 F (36.9 C)  SpO2: 100%   Filed Weights   07/03/20 1033  Weight: 148 lb 6.4 oz (67.3 kg)    GENERAL:alert, no distress and comfortable SKIN: skin color, texture, turgor are normal, no rashes or significant lesions EYES: normal, Conjunctiva are pink and non-injected, sclera clear OROPHARYNX:no exudate, no erythema and lips, buccal mucosa, and tongue normal  NECK: supple, thyroid normal size, non-tender, without nodularity LYMPH:  no palpable lymphadenopathy in the cervical, axillary or inguinal LUNGS: clear to auscultation and percussion with normal breathing effort HEART: regular rate & rhythm and no murmurs and no lower extremity edema ABDOMEN:abdomen soft, non-tender and normal bowel sounds Musculoskeletal:no cyanosis of digits and no clubbing  NEURO: alert & oriented x 3 with fluent speech, no focal motor/sensory deficits  LABORATORY DATA:  I have reviewed the data as listed    Component Value Date/Time   NA 140 07/03/2020  1003   K 3.6 07/03/2020 1003   CL 106 07/03/2020 1003   CO2 22 07/03/2020 1003   GLUCOSE 184 (H) 07/03/2020 1003   BUN 20 07/03/2020 1003   CREATININE 0.83 07/03/2020 1003   CREATININE 0.89 12/04/2015 0901   CALCIUM 9.9 07/03/2020 1003   PROT 7.4 07/03/2020 1003   ALBUMIN 4.3 07/03/2020 1003   AST 28 07/03/2020 1003   ALT 39 07/03/2020 1003   ALKPHOS 82 07/03/2020 1003   BILITOT 0.2 (L) 07/03/2020 1003   GFRNONAA >60 07/03/2020 1003   GFRAA >60 05/18/2020 0941    No results found for: SPEP, UPEP  Lab Results  Component Value Date   WBC 8.8 07/03/2020   NEUTROABS 6.9 07/03/2020   HGB 11.6 (L) 07/03/2020   HCT 34.7 (L) 07/03/2020   MCV 85.7 07/03/2020   PLT 150 07/03/2020      Chemistry      Component Value Date/Time   NA 140 07/03/2020 1003   K 3.6 07/03/2020 1003   CL 106 07/03/2020 1003   CO2 22 07/03/2020 1003   BUN 20 07/03/2020 1003   CREATININE 0.83 07/03/2020 1003   CREATININE 0.89 12/04/2015 0901      Component Value Date/Time   CALCIUM 9.9 07/03/2020 1003   ALKPHOS 82 07/03/2020 1003   AST 28 07/03/2020 1003   ALT 39 07/03/2020 1003   BILITOT 0.2 (L) 07/03/2020 1003

## 2020-07-03 NOTE — Assessment & Plan Note (Signed)
Overall, she tolerated treatment better with slight dose adjustment We will continue treatment as scheduled with similar dose adjustment as before

## 2020-07-03 NOTE — Patient Instructions (Signed)
[facility] Discharge Instructions for Patients Receiving Chemotherapy  Today you received the following chemotherapy agents Paclitaxel; Carboplatin  To help prevent nausea and vomiting after your treatment, we encourage you to take your nausea medication as directed   If you develop nausea and vomiting that is not controlled by your nausea medication, call the clinic.   BELOW ARE SYMPTOMS THAT SHOULD BE REPORTED IMMEDIATELY:  *FEVER GREATER THAN 100.5 F  *CHILLS WITH OR WITHOUT FEVER  NAUSEA AND VOMITING THAT IS NOT CONTROLLED WITH YOUR NAUSEA MEDICATION  *UNUSUAL SHORTNESS OF BREATH  *UNUSUAL BRUISING OR BLEEDING  TENDERNESS IN MOUTH AND THROAT WITH OR WITHOUT PRESENCE OF ULCERS  *URINARY PROBLEMS  *BOWEL PROBLEMS  UNUSUAL RASH Items with * indicate a potential emergency and should be followed up as soon as possible.  Feel free to call the clinic should you have any questions or concerns. The clinic phone number is [phone].  Please show the CHEMO ALERT CARD at check-in to the Emergency Department and triage nurse.   

## 2020-07-03 NOTE — Assessment & Plan Note (Signed)
This is stable since recent dose adjustment We will continue similar dose adjustment as before

## 2020-07-06 ENCOUNTER — Telehealth: Payer: Self-pay | Admitting: *Deleted

## 2020-07-06 NOTE — Telephone Encounter (Signed)
CALLED PATIENT TO REMIND OF HDR TX. FOR 07-09-20 @ 3 PM, LVM FOR  A RETURN CALL

## 2020-07-08 NOTE — Progress Notes (Signed)
  Radiation Oncology         (336) [phone] ________________________________  Name: Emily [name] MRN: [id number]  Date: 07/09/2020  DOB: June 28, 1961  CC: [name], MD  [name], MD  HDR BRACHYTHERAPY NOTE  DIAGNOSIS: Stage IIIA (pT3a, pN0) endometrioid endometrial adenocarcinoma, FIGO grade 2   Simple treatment device note: Patient had construction of her custom vaginal cylinder. She will be treated with a 2.5 cm diameter segmented cylinder. This conforms to her anatomy without undue discomfort.  Vaginal brachytherapy procedure node: The patient was brought to the [place] suite. Identity was confirmed. All relevant records and images related to the planned course of therapy were reviewed. The patient freely provided informed written consent to proceed with treatment after reviewing the details related to the planned course of therapy. The consent form was witnessed and verified by the simulation staff. Then, the patient was set-up in a stable reproducible supine position for radiation therapy. Pelvic exam revealed the vaginal cuff to be intact . The patient's custom vaginal cylinder was placed in the proximal vagina. This was affixed to the CT/MR stabilization plate to prevent slippage. Patient tolerated the placement well.  Verification simulation note:  A fiducial marker was placed within the vaginal cylinder. An AP and lateral film was then obtained through the pelvis area. This documented accurate position of the vaginal cylinder for treatment.  HDR BRACHYTHERAPY TREATMENT  The remote afterloading device was affixed to the vaginal cylinder by catheter. Patient then proceeded to undergo her first high-dose-rate treatment directed at the proximal vagina. The patient was prescribed a dose of 6.0 gray to be delivered to the mucosal surface. Treatment length was 3.0 cm. Patient was treated with 1 channel using 7 dwell positions. Treatment time was 168.6 seconds. Iridium 192 was the  high-dose-rate source for treatment. The patient tolerated the treatment well. After completion of her therapy, a radiation survey was performed documenting return of the iridium source into the [employer] safe.   PLAN: The patient will return in three days for her second high-dose-rate treatment. ________________________________    Blair Promise, PhD, MD  This document serves as a record of services personally performed by Gery Pray, MD. It was created on his behalf by Clerance Lav, a trained medical scribe. The creation of this record is based on the scribe's personal observations and the provider's statements to them. This document has been checked and approved by the attending provider.

## 2020-07-09 ENCOUNTER — Ambulatory Visit
Admission: RE | Admit: 2020-07-09 | Discharge: 2020-07-09 | Disposition: A | Discharge status: Home / Self Care | Payer: 59 | Source: Ambulatory Visit | Attending: Radiation Oncology | Admitting: Radiation Oncology

## 2020-07-09 ENCOUNTER — Other Ambulatory Visit: Payer: Self-pay

## 2020-07-09 DIAGNOSIS — C541 Malignant neoplasm of endometrium: Secondary | ICD-10-CM | POA: Insufficient documentation

## 2020-07-09 DIAGNOSIS — Z510 Encounter for antineoplastic radiation therapy: Secondary | ICD-10-CM | POA: Insufficient documentation

## 2020-07-11 ENCOUNTER — Telehealth: Payer: Self-pay | Admitting: *Deleted

## 2020-07-11 NOTE — Telephone Encounter (Signed)
Called patient to remind of HDR Tx. For 07-12-20 @ 1 pm, spoke with patient and she is aware of this tx.

## 2020-07-12 ENCOUNTER — Ambulatory Visit
Admission: RE | Admit: 2020-07-12 | Discharge: 2020-07-12 | Disposition: A | Discharge status: Home / Self Care | Payer: 59 | Source: Ambulatory Visit | Attending: Radiation Oncology | Admitting: Radiation Oncology

## 2020-07-12 ENCOUNTER — Other Ambulatory Visit: Payer: Self-pay

## 2020-07-12 DIAGNOSIS — C541 Malignant neoplasm of endometrium: Secondary | ICD-10-CM

## 2020-07-12 DIAGNOSIS — Z510 Encounter for antineoplastic radiation therapy: Secondary | ICD-10-CM | POA: Diagnosis not present

## 2020-07-12 NOTE — Progress Notes (Addendum)
  Radiation Oncology         [phone] ________________________________  Name: Emily [name] MRN: [id number]  Date: 07/12/2020  DOB: May 28, 1961  CC: [name], MD  [name], MD  HDR BRACHYTHERAPY NOTE  DIAGNOSIS: Stage IIIA (pT3a, pN0) endometrioid endometrial adenocarcinoma, FIGO grade 2   Simple treatment device note: Patient had construction of her custom vaginal cylinder. She will be treated with a 2.5 cm diameter segmented cylinder. This conforms to her anatomy without undue discomfort.  Vaginal brachytherapy procedure node: The patient was brought to the [place] suite. Identity was confirmed. All relevant records and images related to the planned course of therapy were reviewed. The patient freely provided informed written consent to proceed with treatment after reviewing the details related to the planned course of therapy. The consent form was witnessed and verified by the simulation staff. Then, the patient was set-up in a stable reproducible supine position for radiation therapy. Pelvic exam revealed the vaginal cuff to be intact . The patient's custom vaginal cylinder was placed in the proximal vagina. This was affixed to the CT/MR stabilization plate to prevent slippage. Patient tolerated the placement well.  Verification simulation note:  A fiducial marker was placed within the vaginal cylinder. An AP and lateral film was then obtained through the pelvis area. This documented accurate position of the vaginal cylinder for treatment.  HDR BRACHYTHERAPY TREATMENT  The remote afterloading device was affixed to the vaginal cylinder by catheter. Patient then proceeded to undergo her second high-dose-rate treatment directed at the proximal vagina. The patient was prescribed a dose of 6.0 gray to be delivered to the mucosal surface. Treatment length was 3.0 cm. Patient was treated with 1 channel using 7 dwell positions. Treatment time was 173.2 seconds. Iridium 192 was the  high-dose-rate source for treatment. The patient tolerated the treatment well. After completion of her therapy, a radiation survey was performed documenting return of the iridium source into the [employer] safe.   PLAN: The patient will return in four days for her third high-dose-rate treatment. ________________________________    Blair Promise, PhD, MD  This document serves as a record of services personally performed by Gery Pray, MD. It was created on his behalf by Clerance Lav, a trained medical scribe. The creation of this record is based on the scribe's personal observations and the provider's statements to them. This document has been checked and approved by the attending provider.

## 2020-07-13 ENCOUNTER — Telehealth: Payer: Self-pay | Admitting: *Deleted

## 2020-07-13 NOTE — Telephone Encounter (Signed)
CALLED PATIENT TO REMIND OF HDR TX. FOR 07-16-20 @ 1PM, SPOKE WITH PATIENT AND SHE IS AWARE OF THIS [place].

## 2020-07-16 ENCOUNTER — Other Ambulatory Visit: Payer: Self-pay

## 2020-07-16 ENCOUNTER — Ambulatory Visit
Admission: RE | Admit: 2020-07-16 | Discharge: 2020-07-16 | Disposition: A | Discharge status: Home / Self Care | Payer: 59 | Source: Ambulatory Visit | Attending: Radiation Oncology | Admitting: Radiation Oncology

## 2020-07-16 DIAGNOSIS — C541 Malignant neoplasm of endometrium: Secondary | ICD-10-CM | POA: Diagnosis not present

## 2020-07-16 DIAGNOSIS — Z510 Encounter for antineoplastic radiation therapy: Secondary | ICD-10-CM | POA: Diagnosis not present

## 2020-07-16 NOTE — Progress Notes (Signed)
  Radiation Oncology         (336) [phone] ________________________________  Name: Emily [name] MRN: [id number]  Date: 07/16/2020  DOB: [birth year]  CC: [name], MD  [name], MD  HDR BRACHYTHERAPY NOTE  DIAGNOSIS: Stage IIIA (pT3a, pN0) endometrioid endometrial adenocarcinoma, FIGO grade 2   Simple treatment device note: Patient had construction of her custom vaginal cylinder. She will be treated with a 2.5 cm diameter segmented cylinder. This conforms to her anatomy without undue discomfort.  Vaginal brachytherapy procedure node: The patient was brought to the [place] suite. Identity was confirmed. All relevant records and images related to the planned course of therapy were reviewed. The patient freely provided informed written consent to proceed with treatment after reviewing the details related to the planned course of therapy. The consent form was witnessed and verified by the simulation staff. Then, the patient was set-up in a stable reproducible supine position for radiation therapy. Pelvic exam revealed the vaginal cuff to be intact . The patient's custom vaginal cylinder was placed in the proximal vagina. This was affixed to the CT/MR stabilization plate to prevent slippage. Patient tolerated the placement well.  Verification simulation note:  A fiducial marker was placed within the vaginal cylinder. An AP and lateral film was then obtained through the pelvis area. This documented accurate position of the vaginal cylinder for treatment.  HDR BRACHYTHERAPY TREATMENT  The remote afterloading device was affixed to the vaginal cylinder by catheter. Patient then proceeded to undergo her third high-dose-rate treatment directed at the proximal vagina. The patient was prescribed a dose of 6.0 gray to be delivered to the mucosal surface. Treatment length was 3.0 cm. Patient was treated with 1 channel using 7 dwell positions. Treatment time was 179.8 seconds. Iridium 192 was the  high-dose-rate source for treatment. The patient tolerated the treatment well. After completion of her therapy, a radiation survey was performed documenting return of the iridium source into the [employer] safe.   PLAN: The patient will return in three days for her fourth high-dose-rate treatment. ________________________________    Blair Promise, PhD, MD  This document serves as a record of services personally performed by Gery Pray, MD. It was created on his behalf by Clerance Lav, a trained medical scribe. The creation of this record is based on the scribe's personal observations and the provider's statements to them. This document has been checked and approved by the attending provider.

## 2020-07-18 ENCOUNTER — Telehealth: Payer: Self-pay | Admitting: *Deleted

## 2020-07-18 NOTE — Telephone Encounter (Signed)
CALLED PATIENT TO REMIND OF HDR TX. FOR 07-19-20 @ 1 PM, SPOKE WITH PATIENT AND SHE IS AWARE OF THIS [place].

## 2020-07-18 NOTE — Progress Notes (Signed)
[place]         [phone] ________________________________  Name: LEXIANA SPINDEL MRN: [id number]  Date: 07/19/2020  DOB: June 29, 1961  CC: [name], MD  [name], MD  HDR BRACHYTHERAPY NOTE  DIAGNOSIS: Stage IIIA (pT3a, pN0) endometrioid endometrial adenocarcinoma, FIGO grade 2   Simple treatment device note: Patient had construction of her custom vaginal cylinder. She will be treated with a 2.5 cm diameter segmented cylinder. This conforms to her anatomy without undue discomfort.  Vaginal brachytherapy procedure node: The patient was brought to the [place] suite. Identity was confirmed. All relevant records and images related to the planned course of therapy were reviewed. The patient freely provided informed written consent to proceed with treatment after reviewing the details related to the planned course of therapy. The consent form was witnessed and verified by the simulation staff. Then, the patient was set-up in a stable reproducible supine position for radiation therapy. Pelvic exam revealed the vaginal cuff to be intact . The patient's custom vaginal cylinder was placed in the proximal vagina. This was affixed to the CT/MR stabilization plate to prevent slippage. Patient tolerated the placement well.  Verification simulation note:  A fiducial marker was placed within the vaginal cylinder. An AP and lateral film was then obtained through the pelvis area. This documented accurate position of the vaginal cylinder for treatment.  HDR BRACHYTHERAPY TREATMENT  The remote afterloading device was affixed to the vaginal cylinder by catheter. Patient then proceeded to undergo her fourth high-dose-rate treatment directed at the proximal vagina. The patient was prescribed a dose of 6.0 gray to be delivered to the mucosal surface. Treatment length was 3.0 cm. Patient was treated with 1 channel using 7 dwell positions. Treatment time was 184.8 seconds. Iridium 192 was  the high-dose-rate source for treatment. The patient tolerated the treatment well. After completion of her therapy, a radiation survey was performed documenting return of the iridium source into the [employer] safe.   PLAN: The patient will return next week for her fifth and final high-dose-rate treatment. ________________________________    Blair Promise, PhD, MD  This document serves as a record of services personally performed by Gery Pray, MD. It was created on his behalf by Clerance Lav, a trained medical scribe. The creation of this record is based on the scribe's personal observations and the provider's statements to them. This document has been checked and approved by the attending provider.

## 2020-07-19 ENCOUNTER — Other Ambulatory Visit: Payer: Self-pay

## 2020-07-19 ENCOUNTER — Ambulatory Visit
Admission: RE | Admit: 2020-07-19 | Discharge: 2020-07-19 | Disposition: A | Discharge status: Home / Self Care | Payer: 59 | Source: Ambulatory Visit | Attending: Radiation Oncology | Admitting: Radiation Oncology

## 2020-07-19 DIAGNOSIS — C541 Malignant neoplasm of endometrium: Secondary | ICD-10-CM | POA: Diagnosis not present

## 2020-07-19 DIAGNOSIS — Z510 Encounter for antineoplastic radiation therapy: Secondary | ICD-10-CM | POA: Diagnosis not present

## 2020-07-25 ENCOUNTER — Telehealth: Payer: Self-pay | Admitting: *Deleted

## 2020-07-25 NOTE — Telephone Encounter (Signed)
CALLED PATIENT TO REMIND OF HDR TX. FOR 07-26-20 @ 9 AM, SPOKE WITH PATIENT AND SHE IS AWARE OF THIS Emily [name].

## 2020-07-26 ENCOUNTER — Other Ambulatory Visit: Payer: Self-pay

## 2020-07-26 ENCOUNTER — Ambulatory Visit
Admission: RE | Admit: 2020-07-26 | Discharge: 2020-07-26 | Disposition: A | Discharge status: Home / Self Care | Payer: 59 | Source: Ambulatory Visit | Attending: Radiation Oncology | Admitting: Radiation Oncology

## 2020-07-26 DIAGNOSIS — Z510 Encounter for antineoplastic radiation therapy: Secondary | ICD-10-CM | POA: Diagnosis not present

## 2020-07-26 DIAGNOSIS — C541 Malignant neoplasm of endometrium: Secondary | ICD-10-CM

## 2020-07-26 NOTE — Progress Notes (Signed)
[place]         ([phone]) [phone] ________________________________  Name: Emily [name] MRN: [id number]  Date: 07/26/2020  DOB: 10-22-1960  CC: [name], MD  [name], MD  HDR BRACHYTHERAPY NOTE  DIAGNOSIS: Stage IIIA (pT3a, pN0) endometrioid endometrial adenocarcinoma, FIGO grade 2   Simple treatment device note: Patient had construction of her custom vaginal cylinder. She will be treated with a 2.5 cm diameter segmented cylinder. This conforms to her anatomy without undue discomfort.  Vaginal brachytherapy procedure node: The patient was brought to the [place] suite. Identity was confirmed. All relevant records and images related to the planned course of therapy were reviewed. The patient freely provided informed written consent to proceed with treatment after reviewing the details related to the planned course of therapy. The consent form was witnessed and verified by the simulation staff. Then, the patient was set-up in a stable reproducible supine position for radiation therapy. Pelvic exam revealed the vaginal cuff to be intact . The patient's custom vaginal cylinder was placed in the proximal vagina. This was affixed to the CT/MR stabilization plate to prevent slippage. Patient tolerated the placement well.  Verification simulation note:  A fiducial marker was placed within the vaginal cylinder. An AP and lateral film was then obtained through the pelvis area. This documented accurate position of the vaginal cylinder for treatment.  HDR BRACHYTHERAPY TREATMENT  The remote afterloading device was affixed to the vaginal cylinder by catheter. Patient then proceeded to undergo her fifth high-dose-rate treatment directed at the proximal vagina. The patient was prescribed a dose of 6.0 gray to be delivered to the mucosal surface. Treatment length was 3.0 cm. Patient was treated with 1 channel using 7 dwell positions. Treatment time was 197.4 seconds. Iridium 192 was the  high-dose-rate source for treatment. The patient tolerated the treatment well. After completion of her therapy, a radiation survey was performed documenting return of the iridium source into the [employer] safe.   PLAN: .  She overall tolerated her vaginal brachytherapy well.  Patient experienced some discomfort with placement of the cylinder.  She did not have any significant brown vaginal discharge that she had prior to initiation of brachytherapy.  She will proceed with her fifth cycle of chemotherapy tomorrow.  Routine follow-up in 1 month.  She will be given a vaginal dilator at that time. ________________________________    Blair Promise, PhD, MD  This document serves as a record of services personally performed by Gery Pray, MD. It was created on his behalf by Clerance Lav, a trained medical scribe. The creation of this record is based on the scribe's personal observations and the provider's statements to them. This document has been checked and approved by the attending provider.

## 2020-07-27 ENCOUNTER — Other Ambulatory Visit: Payer: Self-pay

## 2020-07-27 ENCOUNTER — Encounter: Payer: Self-pay | Admitting: Hematology and Oncology

## 2020-07-27 ENCOUNTER — Inpatient Hospital Stay: Payer: 59 | Attending: Gynecologic Oncology

## 2020-07-27 ENCOUNTER — Inpatient Hospital Stay (HOSPITAL_BASED_OUTPATIENT_CLINIC_OR_DEPARTMENT_OTHER): Payer: 59 | Admitting: Hematology and Oncology

## 2020-07-27 ENCOUNTER — Inpatient Hospital Stay: Payer: 59

## 2020-07-27 ENCOUNTER — Telehealth: Payer: Self-pay | Admitting: *Deleted

## 2020-07-27 DIAGNOSIS — Z90722 Acquired absence of ovaries, bilateral: Secondary | ICD-10-CM | POA: Insufficient documentation

## 2020-07-27 DIAGNOSIS — Z87440 Personal history of urinary (tract) infections: Secondary | ICD-10-CM | POA: Diagnosis not present

## 2020-07-27 DIAGNOSIS — Z9079 Acquired absence of other genital organ(s): Secondary | ICD-10-CM | POA: Insufficient documentation

## 2020-07-27 DIAGNOSIS — G620 Drug-induced polyneuropathy: Secondary | ICD-10-CM | POA: Diagnosis not present

## 2020-07-27 DIAGNOSIS — C541 Malignant neoplasm of endometrium: Secondary | ICD-10-CM

## 2020-07-27 DIAGNOSIS — D6481 Anemia due to antineoplastic chemotherapy: Secondary | ICD-10-CM

## 2020-07-27 DIAGNOSIS — Z90710 Acquired absence of both cervix and uterus: Secondary | ICD-10-CM | POA: Insufficient documentation

## 2020-07-27 DIAGNOSIS — Z79899 Other long term (current) drug therapy: Secondary | ICD-10-CM | POA: Insufficient documentation

## 2020-07-27 DIAGNOSIS — Z7952 Long term (current) use of systemic steroids: Secondary | ICD-10-CM | POA: Insufficient documentation

## 2020-07-27 DIAGNOSIS — Z5111 Encounter for antineoplastic chemotherapy: Secondary | ICD-10-CM | POA: Diagnosis not present

## 2020-07-27 DIAGNOSIS — T451X5A Adverse effect of antineoplastic and immunosuppressive drugs, initial encounter: Secondary | ICD-10-CM

## 2020-07-27 LAB — CMP (CANCER CENTER ONLY)
ALT: 22 U/L (ref 0–44)
AST: 18 U/L (ref 15–41)
Albumin: 4.2 g/dL (ref 3.5–5.0)
Alkaline Phosphatase: 72 U/L (ref 38–126)
Anion gap: 12 (ref 5–15)
BUN: 26 mg/dL — ABNORMAL HIGH (ref 6–20)
CO2: 23 mmol/L (ref 22–32)
Calcium: 9.4 mg/dL (ref 8.9–10.3)
Chloride: 104 mmol/L (ref 98–111)
Creatinine: 0.88 mg/dL (ref 0.44–1.00)
GFR, Estimated: >60 mL/min (ref >60)
Glucose, Bld: 115 mg/dL — ABNORMAL HIGH (ref 70–99)
Potassium: 3.8 mmol/L (ref 3.5–5.1)
Sodium: 139 mmol/L (ref 135–145)
Total Bilirubin: 0.3 mg/dL (ref 0.3–1.2)
Total Protein: 7.4 g/dL (ref 6.5–8.1)

## 2020-07-27 LAB — CBC WITH DIFFERENTIAL (CANCER CENTER ONLY)
Abs Immature Granulocytes: 0.02 10*3/uL (ref 0.00–0.07)
Basophils Absolute: 0 10*3/uL (ref 0.0–0.1)
Basophils Relative: 0 %
Eosinophils Absolute: 0 10*3/uL (ref 0.0–0.5)
Eosinophils Relative: 0 %
HCT: 32.3 % — ABNORMAL LOW (ref 36.0–46.0)
Hemoglobin: 10.8 g/dL — ABNORMAL LOW (ref 12.0–15.0)
Immature Granulocytes: 0 %
Lymphocytes Relative: 11 %
Lymphs Abs: 1 10*3/uL (ref 0.7–4.0)
MCH: 29.5 pg (ref 26.0–34.0)
MCHC: 33.4 g/dL (ref 30.0–36.0)
MCV: 88.3 fL (ref 80.0–100.0)
Monocytes Absolute: 0.4 10*3/uL (ref 0.1–1.0)
Monocytes Relative: 4 %
Neutro Abs: 7.3 10*3/uL (ref 1.7–7.7)
Neutrophils Relative %: 85 %
Platelet Count: 212 10*3/uL (ref 150–400)
RBC: 3.66 MIL/uL — ABNORMAL LOW (ref 3.87–5.11)
RDW: 16.6 % — ABNORMAL HIGH (ref 11.5–15.5)
WBC Count: 8.7 10*3/uL (ref 4.0–10.5)
nRBC: 0 % (ref 0.0–0.2)

## 2020-07-27 MED ORDER — PALONOSETRON HCL INJECTION 0.25 MG/5ML
0.2500 mg | Freq: Once | INTRAVENOUS | Status: AC
  Administered 2020-07-27: 0.25 mg via INTRAVENOUS

## 2020-07-27 MED ORDER — SODIUM CHLORIDE 0.9 % IV SOLN
140.0000 mg/m2 | Freq: Once | INTRAVENOUS | Status: AC
  Administered 2020-07-27: 240 mg via INTRAVENOUS
  Filled 2020-07-27: qty 40

## 2020-07-27 MED ORDER — PALONOSETRON HCL INJECTION 0.25 MG/5ML
INTRAVENOUS | Status: AC
  Filled 2020-07-27: qty 5

## 2020-07-27 MED ORDER — SODIUM CHLORIDE 0.9 % IV SOLN
Freq: Once | INTRAVENOUS | Status: AC
  Filled 2020-07-27: qty 250

## 2020-07-27 MED ORDER — SODIUM CHLORIDE 0.9 % IV SOLN
10.0000 mg | Freq: Once | INTRAVENOUS | Status: AC
  Administered 2020-07-27: 10 mg via INTRAVENOUS
  Filled 2020-07-27: qty 10

## 2020-07-27 MED ORDER — DIPHENHYDRAMINE HCL 50 MG/ML IJ SOLN
INTRAMUSCULAR | Status: AC
  Filled 2020-07-27: qty 1

## 2020-07-27 MED ORDER — SODIUM CHLORIDE 0.9 % IV SOLN
150.0000 mg | Freq: Once | INTRAVENOUS | Status: AC
  Administered 2020-07-27: 150 mg via INTRAVENOUS
  Filled 2020-07-27: qty 150

## 2020-07-27 MED ORDER — FAMOTIDINE IN NACL 20-0.9 MG/50ML-% IV SOLN
20.0000 mg | Freq: Once | INTRAVENOUS | Status: AC
  Administered 2020-07-27: 20 mg via INTRAVENOUS

## 2020-07-27 MED ORDER — DIPHENHYDRAMINE HCL 50 MG/ML IJ SOLN
12.5000 mg | Freq: Once | INTRAMUSCULAR | Status: AC
  Administered 2020-07-27: 12.5 mg via INTRAVENOUS

## 2020-07-27 MED ORDER — FAMOTIDINE IN NACL 20-0.9 MG/50ML-% IV SOLN
INTRAVENOUS | Status: AC
  Filled 2020-07-27: qty 50

## 2020-07-27 MED ORDER — SODIUM CHLORIDE 0.9 % IV SOLN
615.4550 mg | Freq: Once | INTRAVENOUS | Status: AC
  Administered 2020-07-27: 620 mg via INTRAVENOUS
  Filled 2020-07-27: qty 62

## 2020-07-27 NOTE — Assessment & Plan Note (Signed)
This is likely due to recent treatment. The patient denies recent history of bleeding such as epistaxis, hematuria or hematochezia. She is asymptomatic from the anemia. I will observe for now.   

## 2020-07-27 NOTE — Patient Instructions (Signed)
   [facility] Discharge Instructions for Patients Receiving Chemotherapy  Today you received the following chemotherapy agents Taxol and Carboplatin   To help prevent nausea and vomiting after your treatment, we encourage you to take your nausea medication as directed.    If you develop nausea and vomiting that is not controlled by your nausea medication, call the clinic.   BELOW ARE SYMPTOMS THAT SHOULD BE REPORTED IMMEDIATELY:  *FEVER GREATER THAN 100.5 F  *CHILLS WITH OR WITHOUT FEVER  NAUSEA AND VOMITING THAT IS NOT CONTROLLED WITH YOUR NAUSEA MEDICATION  *UNUSUAL SHORTNESS OF BREATH  *UNUSUAL BRUISING OR BLEEDING  TENDERNESS IN MOUTH AND THROAT WITH OR WITHOUT PRESENCE OF ULCERS  *URINARY PROBLEMS  *BOWEL PROBLEMS  UNUSUAL RASH Items with * indicate a potential emergency and should be followed up as soon as possible.  Feel free to call the clinic should you have any questions or concerns. The clinic phone number is [phone].  Please show the CHEMO ALERT CARD at check-in to the Emergency Department and triage nurse.   

## 2020-07-27 NOTE — Progress Notes (Signed)
[place] OFFICE PROGRESS NOTE  Patient Care Team: [name], MD as PCP - General (Family Medicine)  ASSESSMENT & PLAN:  Endometrial cancer [facility]) She tolerated recent radiation treatment well With recent dose adjustment, her neuropathy is stable We will proceed with similar dose adjustment as before  Anemia due to antineoplastic chemotherapy This is likely due to recent treatment. The patient denies recent history of bleeding such as epistaxis, hematuria or hematochezia. She is asymptomatic from the anemia. I will observe for now.   Peripheral neuropathy due to chemotherapy [facility]) She tolerated recent dose adjustment well Observe closely for now   No orders of the defined types were placed in this encounter.   All questions were answered. The patient knows to call the clinic with any problems, questions or concerns. The total time spent in the appointment was 20 minutes encounter with patients including review of chart and various tests results, discussions about plan of care and coordination of care plan   [name], MD 07/27/2020 1:40 PM  INTERVAL HISTORY: Please see below for problem oriented charting. She returns with her sister to be seen prior to cycle 5 of treatment She tolerated recent reduced dose chemotherapy well Denies mucositis Neuropathy stable Her pelvic discomfort has improved No recent nausea or constipation  SUMMARY OF ONCOLOGIC HISTORY: Oncology History Overview Note  IHC MMR normal Endometrioid MSI-stable   Endometrial cancer ([place])  02/28/2020 Initial Diagnosis   The patient presented for an exam on 6/22.  At that time she endorsed episodes of postmenopausal bleeding starting in 08-08-2019 after her mother's death.  She had not had a menses in a number of years.  Patient's exam was limited by her intolerance in the position of the cervix.  Pap test was performed on 6/30 showing atypical glandular cells of undetermined significance.  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 She describes about a year long history previously of clear vaginal discharge. She saw her PCP for this complaint and given what was felt to be urethral swelling, she was treated for a UTI (lab testing did not show an infection). She has not been sexually active since her 76s. She endorses increased urinary frequency. After a year, the clear discharge became yellow (and at one point looked green). The patient was seen again and a pap was attempted in the office but she was unable to tolerate this. She performed a vaginal swab on herself that found yeast. She used Monistat multiple times with improvement in her discharge. She then began to have pale pink discharge. At this time, she was seen by a Urogynecologist at [facility] and it sound like she had urodynamic testing. She describes a raw feeling and burning with her urine. She continues to have vaginal discharge which she notes has an ammonia-like smell. Her vaginal bleeding started again about a month ago. She briefly tried vaginal estrogen after seeing the Urogynecologist but she felt that the bleeding worsened as did the ammonia smell, so she stopped.   03/16/2020 Initial Diagnosis   Endometrial cancer ([place])   03/21/2020 Imaging   1. Abnormally thickened endometrial complex measuring up to 12 mm, presumably related to known history of endometrial carcinoma. 2. Underlying fibroid uterus as detailed above. 3. Normal sonographic evaluation of the ovaries. No abnormal free fluid. Please note that the appendix appears to be potentially located in close proximity to the right ovary on this exam. As this patient is scheduled to undergo total  hysterectomy with bilateral salpingo oophorectomy in the near future, close attention to this region at surgical intervention is recommended.     03/27/2020 Pathology Results   A. CUL DE SAC, ANTERIOR, BIOPSY:  -  Benign  fibrovascular tissue with mesothelial hyperplasia  -  No carcinoma identified  -  See comment   B. MONS, RIGHT, BIOPSY:  -  Melanocytic nevus, intradermal type  -  See comment   C. LYMPH NODE, SENTINEL, RIGHT EXTERNAL ILIAC, BIOPSY:  -  No carcinoma identified in one lymph node (0/1)  -  See comment   D. PERITONEUM, LEFT SIDEWALL, BIOPSY:  -  Benign fibrovascular tissue with chronic inflammation  -  No carcinoma identified   E. LYMPH NODE, SENTINEL, LEFT EXTERNAL ILIAC, BIOPSY:  -  No carcinoma identified in one lymph node (0/1)  -  See comment   F. UTERUS, CERVIX, BILATERAL FALLOPIAN TUBES AND OVARIES:   Uterus:  -  Endometrioid carcinoma, FIGO grade 2  -  Leiomyomata (1.6 cm ; largest)  -  Carcinoma present in lymphoid aggregate in serosal adhesions  (lymphovascular space invasion)  -  See oncology table and comment below   Cervix:  -  No carcinoma identified   Bilateral Ovaries:  -  No carcinoma identified   Bilateral Fallopian tubes:  -  Carcinoma within lumen of fallopian tube  (left)   G. PERIURETHRAL, BIOPSY:  -  No carcinoma identified    03/27/2020 Surgery   Robotic-assisted laparoscopic total hysterectomy with bilateral salpingoophorectomy, SLN biopsy   On EUA, very narrow introitus most due to tight hymenal ring. Small mobile uterus. Hyperpigmented 0.55m plaque on right mons, biopsied. Urethral overall normal in appearance, given hyperemic appearance in clinic, biopsy taken. On intra-abdominal entry, some scarring noted on inferior aspect of the liver, anterior right diaphragm and left lobe of the liver. Omentum normal appearing. Mesentery of the small and large bowel as well as the bowel itself studded with 1-266minflammatory-appearing nodules. Appendix with same studding, otherwise normal in appearance. Uterus 6cm and normal appearing with the exception of inflammatory exudate on posterior aspect of the fundus. Bilateral adnexa normal appearing. Chocolate brown  staining within most of the cul-de-sac. Inflammatory appearing nodules studding bilateral pelvic walls, anterior and posterior cul-de-sac c/w endometriosis. No adenopathy. No intra-abdominal or pelvic evidence of disease.   03/2020 Initial Biopsy   EMB - gr 1-2 EMCA   03/27/2020 Cancer Staging   Staging form: Corpus Uteri - Carcinoma and Carcinosarcoma, AJCC 8th Edition - Clinical stage from 03/27/2020: FIGO Stage IIIA (cT3a, cN0(sn), cM0) - Signed by GoHeath LarkMD on 04/24/2020   04/23/2020 Imaging   Status post hysterectomy and bilateral salpingo-oophorectomy.   Mild stranding along the anterior transverse mesocolon. No frank peritoneal nodularity or omental caking. Attention on follow-up is suggested.   No findings specific for recurrent or metastatic disease.   Bladder is mildly thick-walled although underdistended.     04/27/2020 -  Chemotherapy   The patient had carboplatin and taxol for chemotherapy treatment.       REVIEW OF SYSTEMS:   Constitutional: Denies fevers, chills or abnormal weight loss Eyes: Denies blurriness of vision Ears, nose, mouth, throat, and face: Denies mucositis or sore throat Respiratory: Denies cough, dyspnea or wheezes Cardiovascular: Denies palpitation, chest discomfort or lower extremity swelling Gastrointestinal:  Denies nausea, heartburn or change in bowel habits Skin: Denies abnormal skin rashes Lymphatics: Denies new lymphadenopathy or easy bruising Behavioral/Psych: Mood is stable, no new changes  All other  systems were reviewed with the patient and are negative.  I have reviewed the past medical history, past surgical history, social history and family history with the patient and they are unchanged from previous note.  ALLERGIES:  is allergic to ceftin [cefuroxime], crestor [rosuvastatin calcium], and latex.  MEDICATIONS:  Current Outpatient Medications  Medication Sig Dispense Refill  . Calcium Carb-Cholecalciferol (CALCIUM-VITAMIN D3)  600-400 MG-UNIT TABS Take 1 tablet by mouth daily.    Marland Kitchen CALCIUM PO Take by mouth.     . Cholecalciferol (VITAMIN D) 125 MCG (5000 UT) CAPS Take 5,000 Units by mouth daily.    Marland Kitchen dexamethasone (DECADRON) 4 MG tablet Take 2 tabs at the night before and 2 tab the morning of chemotherapy, every 3 weeks, by mouth x 6 cycles 36 tablet 6  . famotidine (PEPCID) 20 MG tablet Take 20 mg by mouth 2 (two) times daily as needed for heartburn or indigestion.    Marland Kitchen ibuprofen (ADVIL) 800 MG tablet Take 1 tablet (800 mg total) by mouth every 8 (eight) hours as needed for moderate pain. For AFTER surgery only 30 tablet 0  . lidocaine (XYLOCAINE) 2 % solution Use as directed 5 mLs in the mouth or throat every 6 (six) hours as needed.    . magic mouthwash w/lidocaine SOLN Take 5 mLs by mouth 4 (four) times daily. 240 mL 0  . Omega-3 Fatty Acids (FISH OIL PO) Take by mouth.     . ondansetron (ZOFRAN) 8 MG tablet Take 1 tablet (8 mg total) by mouth every 8 (eight) hours as needed. Start on the third day after chemotherapy. 30 tablet 1  . oxyCODONE (OXY IR/ROXICODONE) 5 MG immediate release tablet Take 1 tablet (5 mg total) by mouth every 4 (four) hours as needed for severe pain. For AFTER surgery, do not take and drive 10 tablet 0  . pantoprazole (PROTONIX) 40 MG tablet Take 40 mg by mouth daily as needed (acid reduction).     . polyethylene glycol (MIRALAX / GLYCOLAX) 17 g packet Take 17 g by mouth daily as needed.    . prochlorperazine (COMPAZINE) 10 MG tablet Take 1 tablet (10 mg total) by mouth every 6 (six) hours as needed (Nausea or vomiting). 30 tablet 1  . senna-docusate (SENOKOT-S) 8.6-50 MG tablet Take 2 tablets by mouth at bedtime. For AFTER surgery, do not take if having diarrhea 30 tablet 0  . VITAMIN E PO Take by mouth.      No current facility-administered medications for this visit.   Facility-Administered Medications Ordered in Other Visits  Medication Dose Route Frequency Provider Last Rate Last Admin   . CARBOplatin (PARAPLATIN) 620 mg in sodium chloride 0.9 % 250 mL chemo infusion  620 mg Intravenous Once Alvy Bimler, Antrice Pal, MD      . PACLitaxel (TAXOL) 240 mg in sodium chloride 0.9 % 250 mL chemo infusion (> 45m/m2)  140 mg/m2 (Treatment Plan Recorded) Intravenous Once GHeath Lark MD 97 mL/hr at 07/27/20 1151 240 mg at 07/27/20 1151    PHYSICAL EXAMINATION: ECOG PERFORMANCE STATUS: 1 - Symptomatic but completely ambulatory  Vitals:   07/27/20 0905  BP: 113/70  Pulse: 74  Resp: 18  Temp: 98.2 F (36.8 C)  SpO2: 98%   Filed Weights   07/27/20 0905  Weight: 145 lb 9.6 oz (66 kg)    GENERAL:alert, no distress and comfortable SKIN: skin color, texture, turgor are normal, no rashes or significant lesions EYES: normal, Conjunctiva are pink and non-injected, sclera clear OROPHARYNX:no exudate,  no erythema and lips, buccal mucosa, and tongue normal  NECK: supple, thyroid normal size, non-tender, without nodularity LYMPH:  no palpable lymphadenopathy in the cervical, axillary or inguinal LUNGS: clear to auscultation and percussion with normal breathing effort HEART: regular rate & rhythm and no murmurs and no lower extremity edema ABDOMEN:abdomen soft, non-tender and normal bowel sounds Musculoskeletal:no cyanosis of digits and no clubbing  NEURO: alert & oriented x 3 with fluent speech, no focal motor/sensory deficits  LABORATORY DATA:  I have reviewed the data as listed    Component Value Date/Time   NA 139 07/27/2020 0825   K 3.8 07/27/2020 0825   CL 104 07/27/2020 0825   CO2 23 07/27/2020 0825   GLUCOSE 115 (H) 07/27/2020 0825   BUN 26 (H) 07/27/2020 0825   CREATININE 0.88 07/27/2020 0825   CREATININE 0.89 12/04/2015 0901   CALCIUM 9.4 07/27/2020 0825   PROT 7.4 07/27/2020 0825   ALBUMIN 4.2 07/27/2020 0825   AST 18 07/27/2020 0825   ALT 22 07/27/2020 0825   ALKPHOS 72 07/27/2020 0825   BILITOT 0.3 07/27/2020 0825   GFRNONAA >60 07/27/2020 0825   GFRAA >60 05/18/2020  0941    No results found for: SPEP, UPEP  Lab Results  Component Value Date   WBC 8.7 07/27/2020   NEUTROABS 7.3 07/27/2020   HGB 10.8 (L) 07/27/2020   HCT 32.3 (L) 07/27/2020   MCV 88.3 07/27/2020   PLT 212 07/27/2020      Chemistry      Component Value Date/Time   NA 139 07/27/2020 0825   K 3.8 07/27/2020 0825   CL 104 07/27/2020 0825   CO2 23 07/27/2020 0825   BUN 26 (H) 07/27/2020 0825   CREATININE 0.88 07/27/2020 0825   CREATININE 0.89 12/04/2015 0901      Component Value Date/Time   CALCIUM 9.4 07/27/2020 0825   ALKPHOS 72 07/27/2020 0825   AST 18 07/27/2020 0825   ALT 22 07/27/2020 0825   BILITOT 0.3 07/27/2020 0825

## 2020-07-27 NOTE — Assessment & Plan Note (Signed)
She tolerated recent dose adjustment well Observe closely for now

## 2020-07-27 NOTE — Assessment & Plan Note (Signed)
She tolerated recent radiation treatment well With recent dose adjustment, her neuropathy is stable We will proceed with similar dose adjustment as before

## 2020-07-27 NOTE — Telephone Encounter (Signed)
CALLED PATIENT TO REMIND OF HDR TX. FOR 07-30-20 @ 2 PM, LVM FOR  A RETURN CALL

## 2020-07-30 ENCOUNTER — Ambulatory Visit: Payer: 59 | Admitting: Radiation Oncology

## 2020-08-09 ENCOUNTER — Telehealth: Payer: Self-pay

## 2020-08-09 NOTE — Telephone Encounter (Signed)
Called and offered appt for 12/8. She declined appt on 12/8 and will keep appt as scheduled. Instructed to call the office back if needed. She verbalized understanding. She will increase water in take and watch he salt intake.

## 2020-08-09 NOTE — Telephone Encounter (Signed)
The next available appt I can see her is next week on 12/8 otherwise keep her appt as is

## 2020-08-09 NOTE — Telephone Encounter (Signed)
She called and left a message. Tuesday she woke up with indention in her ankles after sleeping. She had a burning/ warm sensation up to calf's. She removed the socks and she does not see any edema. Her weight has increased up to 150 lbs due to eating so much the 2nd week after chemo.  She has been eating more salt. She is concerned about neuropathy and her kidney function. She is exercising and may have over did the exercise. She is complaining of bubble in her urine at times and one time she had bubbles from her urethra.  She said FYI.

## 2020-08-17 ENCOUNTER — Other Ambulatory Visit: Payer: Self-pay | Admitting: Hematology and Oncology

## 2020-08-17 ENCOUNTER — Inpatient Hospital Stay: Payer: 59 | Attending: Gynecologic Oncology

## 2020-08-17 ENCOUNTER — Encounter: Payer: Self-pay | Admitting: Hematology and Oncology

## 2020-08-17 ENCOUNTER — Other Ambulatory Visit: Payer: Self-pay

## 2020-08-17 ENCOUNTER — Inpatient Hospital Stay: Payer: 59

## 2020-08-17 ENCOUNTER — Inpatient Hospital Stay (HOSPITAL_BASED_OUTPATIENT_CLINIC_OR_DEPARTMENT_OTHER): Payer: 59 | Admitting: Hematology and Oncology

## 2020-08-17 VITALS — BP 119/58 | HR 85 | Resp 18 | Ht 64.0 in | Wt 150.6 lb

## 2020-08-17 DIAGNOSIS — D61818 Other pancytopenia: Secondary | ICD-10-CM | POA: Diagnosis not present

## 2020-08-17 DIAGNOSIS — Z79899 Other long term (current) drug therapy: Secondary | ICD-10-CM | POA: Diagnosis not present

## 2020-08-17 DIAGNOSIS — Z5111 Encounter for antineoplastic chemotherapy: Secondary | ICD-10-CM | POA: Diagnosis not present

## 2020-08-17 DIAGNOSIS — D539 Nutritional anemia, unspecified: Secondary | ICD-10-CM

## 2020-08-17 DIAGNOSIS — G620 Drug-induced polyneuropathy: Secondary | ICD-10-CM

## 2020-08-17 DIAGNOSIS — T451X5A Adverse effect of antineoplastic and immunosuppressive drugs, initial encounter: Secondary | ICD-10-CM | POA: Diagnosis not present

## 2020-08-17 DIAGNOSIS — R5381 Other malaise: Secondary | ICD-10-CM | POA: Diagnosis not present

## 2020-08-17 DIAGNOSIS — C541 Malignant neoplasm of endometrium: Secondary | ICD-10-CM

## 2020-08-17 DIAGNOSIS — Z87440 Personal history of urinary (tract) infections: Secondary | ICD-10-CM | POA: Diagnosis not present

## 2020-08-17 LAB — CBC WITH DIFFERENTIAL (CANCER CENTER ONLY)
Abs Immature Granulocytes: 0.06 10*3/uL (ref 0.00–0.07)
Basophils Absolute: 0 10*3/uL (ref 0.0–0.1)
Basophils Relative: 0 %
Eosinophils Absolute: 0 10*3/uL (ref 0.0–0.5)
Eosinophils Relative: 0 %
HCT: 30.4 % — ABNORMAL LOW (ref 36.0–46.0)
Hemoglobin: 10.5 g/dL — ABNORMAL LOW (ref 12.0–15.0)
Immature Granulocytes: 1 %
Lymphocytes Relative: 12 %
Lymphs Abs: 0.9 10*3/uL (ref 0.7–4.0)
MCH: 31.1 pg (ref 26.0–34.0)
MCHC: 34.5 g/dL (ref 30.0–36.0)
MCV: 89.9 fL (ref 80.0–100.0)
Monocytes Absolute: 0.2 10*3/uL (ref 0.1–1.0)
Monocytes Relative: 2 %
Neutro Abs: 6.1 10*3/uL (ref 1.7–7.7)
Neutrophils Relative %: 85 %
Platelet Count: 113 10*3/uL — ABNORMAL LOW (ref 150–400)
RBC: 3.38 MIL/uL — ABNORMAL LOW (ref 3.87–5.11)
RDW: 16.9 % — ABNORMAL HIGH (ref 11.5–15.5)
WBC Count: 7.2 10*3/uL (ref 4.0–10.5)
nRBC: 0 % (ref 0.0–0.2)

## 2020-08-17 LAB — CMP (CANCER CENTER ONLY)
ALT: 24 U/L (ref 0–44)
AST: 21 U/L (ref 15–41)
Albumin: 4.2 g/dL (ref 3.5–5.0)
Alkaline Phosphatase: 73 U/L (ref 38–126)
Anion gap: 12 (ref 5–15)
BUN: 17 mg/dL (ref 6–20)
CO2: 22 mmol/L (ref 22–32)
Calcium: 9.3 mg/dL (ref 8.9–10.3)
Chloride: 104 mmol/L (ref 98–111)
Creatinine: 0.95 mg/dL (ref 0.44–1.00)
GFR, Estimated: >60 mL/min (ref >60)
Glucose, Bld: 190 mg/dL — ABNORMAL HIGH (ref 70–99)
Potassium: 3.8 mmol/L (ref 3.5–5.1)
Sodium: 138 mmol/L (ref 135–145)
Total Bilirubin: 0.3 mg/dL (ref 0.3–1.2)
Total Protein: 7.4 g/dL (ref 6.5–8.1)

## 2020-08-17 MED ORDER — SODIUM CHLORIDE 0.9 % IV SOLN
Freq: Once | INTRAVENOUS | Status: AC
  Filled 2020-08-17: qty 250

## 2020-08-17 MED ORDER — DIPHENHYDRAMINE HCL 50 MG/ML IJ SOLN
INTRAMUSCULAR | Status: AC
  Filled 2020-08-17: qty 1

## 2020-08-17 MED ORDER — DIPHENHYDRAMINE HCL 50 MG/ML IJ SOLN
12.5000 mg | Freq: Once | INTRAMUSCULAR | Status: AC
  Administered 2020-08-17: 12.5 mg via INTRAVENOUS

## 2020-08-17 MED ORDER — SODIUM CHLORIDE 0.9 % IV SOLN
615.4550 mg | Freq: Once | INTRAVENOUS | Status: AC
  Administered 2020-08-17: 620 mg via INTRAVENOUS
  Filled 2020-08-17: qty 62

## 2020-08-17 MED ORDER — FAMOTIDINE IN NACL 20-0.9 MG/50ML-% IV SOLN
20.0000 mg | Freq: Once | INTRAVENOUS | Status: AC
  Administered 2020-08-17: 20 mg via INTRAVENOUS

## 2020-08-17 MED ORDER — SODIUM CHLORIDE 0.9 % IV SOLN
10.0000 mg | Freq: Once | INTRAVENOUS | Status: AC
  Administered 2020-08-17: 10 mg via INTRAVENOUS
  Filled 2020-08-17: qty 10

## 2020-08-17 MED ORDER — FAMOTIDINE IN NACL 20-0.9 MG/50ML-% IV SOLN
INTRAVENOUS | Status: AC
  Filled 2020-08-17: qty 50

## 2020-08-17 MED ORDER — PALONOSETRON HCL INJECTION 0.25 MG/5ML
INTRAVENOUS | Status: AC
  Filled 2020-08-17: qty 5

## 2020-08-17 MED ORDER — PALONOSETRON HCL INJECTION 0.25 MG/5ML
0.2500 mg | Freq: Once | INTRAVENOUS | Status: AC
  Administered 2020-08-17: 0.25 mg via INTRAVENOUS

## 2020-08-17 MED ORDER — PACLITAXEL CHEMO INJECTION 300 MG/50ML
140.0000 mg/m2 | Freq: Once | INTRAVENOUS | Status: AC
  Administered 2020-08-17: 246 mg via INTRAVENOUS
  Filled 2020-08-17: qty 41

## 2020-08-17 MED ORDER — SODIUM CHLORIDE 0.9 % IV SOLN
150.0000 mg | Freq: Once | INTRAVENOUS | Status: AC
  Administered 2020-08-17: 150 mg via INTRAVENOUS
  Filled 2020-08-17: qty 150

## 2020-08-17 NOTE — Patient Instructions (Signed)
[facility] Discharge Instructions for Patients Receiving Chemotherapy  Today you received the following chemotherapy agents: paclitaxel/carboplatin.  To help prevent nausea and vomiting after your treatment, we encourage you to take your nausea medication as directed.  If you develop nausea and vomiting that is not controlled by your nausea medication, call the clinic.   BELOW ARE SYMPTOMS THAT SHOULD BE REPORTED IMMEDIATELY:  *FEVER GREATER THAN 100.5 F  *CHILLS WITH OR WITHOUT FEVER  NAUSEA AND VOMITING THAT IS NOT CONTROLLED WITH YOUR NAUSEA MEDICATION  *UNUSUAL SHORTNESS OF BREATH  *UNUSUAL BRUISING OR BLEEDING  TENDERNESS IN MOUTH AND THROAT WITH OR WITHOUT PRESENCE OF ULCERS  *URINARY PROBLEMS  *BOWEL PROBLEMS  UNUSUAL RASH Items with * indicate a potential emergency and should be followed up as soon as possible.  Feel free to call the clinic should you have any questions or concerns. The clinic phone number is [phone].  Please show the CHEMO ALERT CARD at check-in to the Emergency Department and triage nurse.   

## 2020-08-17 NOTE — Assessment & Plan Note (Signed)
She tolerated recent radiation treatment well With recent dose adjustment, her neuropathy is stable She is getting more fatigue and deconditioned due to side effects of treatment We will proceed with similar dose adjustment as before I plan to repeat blood work and imaging study next month for further follow-up

## 2020-08-17 NOTE — Progress Notes (Signed)
Per MD keep current dose of carboplatin. Change is likely due to weight gain.

## 2020-08-17 NOTE — Progress Notes (Signed)
[place] OFFICE PROGRESS NOTE  Patient Care Team: [name], MD as PCP - General (Family Medicine)  ASSESSMENT & PLAN:  Endometrial cancer [facility]) She tolerated recent radiation treatment well With recent dose adjustment, her neuropathy is stable She is getting more fatigue and deconditioned due to side effects of treatment We will proceed with similar dose adjustment as before I plan to repeat blood work and imaging study next month for further follow-up  Peripheral neuropathy due to chemotherapy [facility]) She tolerated recent dose adjustment well Observe closely for now  Pancytopenia, acquired [facility]) She has slight worsening pancytopenia from treatment which is not unexpected She does not need transfusion support but would probably need more time to recover from her blood count I plan to recheck her blood work again next month  Physical deconditioning She has significant generalized deconditioning I recommend referral to physical therapy and rehab   Orders Placed This Encounter  Procedures  . CT ABDOMEN PELVIS W CONTRAST    Standing Status:   Future    Standing Expiration Date:   08/17/2021    Order Specific Question:   If indicated for the ordered procedure, I authorize the administration of contrast media per Radiology protocol    Answer:   Yes    Order Specific Question:   Preferred imaging location?    Answer:   [facility]    Order Specific Question:   Radiology Contrast Protocol - do NOT remove file path    Answer:   [email or web address]    Order Specific Question:   Is patient pregnant?    Answer:   No  . Vitamin B12    Standing Status:   Future    Standing Expiration Date:   08/17/2021  . Iron and TIBC    Standing Status:   Future    Standing Expiration Date:   08/17/2021  . Ferritin    Standing Status:   Future    Standing Expiration Date:   08/17/2021  . Ambulatory referral to Physical Therapy     Referral Priority:   Routine    Referral Type:   Physical Medicine    Referral Reason:   Specialty Services Required    Requested Specialty:   Physical Therapy    Number of Visits Requested:   1    All questions were answered. The patient knows to call the clinic with any problems, questions or concerns. The total time spent in the appointment was 30 minutes encounter with patients including review of chart and various tests results, discussions about plan of care and coordination of care plan   [name], MD 08/17/2020 10:15 AM  INTERVAL HISTORY: Please see below for problem oriented charting. She returns with her sister for final treatment today Since last time I saw her, she feels more tired She has noted some burning sensation/neuropathy for 2 to 3 days after chemo but that has resolved Her appetite is fair She has some intermittent nausea, alleviated by antiemetics No recent infection, fever or chills The patient denies any recent signs or symptoms of bleeding such as spontaneous epistaxis, hematuria or hematochezia.  SUMMARY OF ONCOLOGIC HISTORY: Oncology History Overview Note  IHC MMR normal Endometrioid MSI-stable   Endometrial cancer ([place])  02/28/2020 Initial Diagnosis   The patient presented for an exam on 6/22.  At that time she endorsed episodes of postmenopausal bleeding starting in 08-08-2019 after her mother's death.  She had not had a menses in a number of years.  Patient's exam was limited by her intolerance in the position of the cervix.  Pap test was performed on 6/30 showing atypical glandular cells of undetermined significance. 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 She describes about a year long history previously of clear vaginal discharge. She saw her PCP for this complaint and given what was felt to be urethral swelling, she was treated for a UTI (lab testing did not show an infection). She has not been  sexually active since her 55s. She endorses increased urinary frequency. After a year, the clear discharge became yellow (and at one point looked green). The patient was seen again and a pap was attempted in the office but she was unable to tolerate this. She performed a vaginal swab on herself that found yeast. She used Monistat multiple times with improvement in her discharge. She then began to have pale pink discharge. At this time, she was seen by a Urogynecologist at [facility] and it sound like she had urodynamic testing. She describes a raw feeling and burning with her urine. She continues to have vaginal discharge which she notes has an ammonia-like smell. Her vaginal bleeding started again about a month ago. She briefly tried vaginal estrogen after seeing the Urogynecologist but she felt that the bleeding worsened as did the ammonia smell, so she stopped.   03/16/2020 Initial Diagnosis   Endometrial cancer ([place])   03/21/2020 Imaging   1. Abnormally thickened endometrial complex measuring up to 12 mm, presumably related to known history of endometrial carcinoma. 2. Underlying fibroid uterus as detailed above. 3. Normal sonographic evaluation of the ovaries. No abnormal free fluid. Please note that the appendix appears to be potentially located in close proximity to the right ovary on this exam. As this patient is scheduled to undergo total hysterectomy with bilateral salpingo oophorectomy in the near future, close attention to this region at surgical intervention is recommended.     03/27/2020 Pathology Results   A. CUL DE SAC, ANTERIOR, BIOPSY:  -  Benign fibrovascular tissue with mesothelial hyperplasia  -  No carcinoma identified  -  See comment   B. MONS, RIGHT, BIOPSY:  -  Melanocytic nevus, intradermal type  -  See comment   C. LYMPH NODE, SENTINEL, RIGHT EXTERNAL ILIAC, BIOPSY:  -  No carcinoma identified in one lymph node (0/1)  -  See comment   D. PERITONEUM, LEFT SIDEWALL,  BIOPSY:  -  Benign fibrovascular tissue with chronic inflammation  -  No carcinoma identified   E. LYMPH NODE, SENTINEL, LEFT EXTERNAL ILIAC, BIOPSY:  -  No carcinoma identified in one lymph node (0/1)  -  See comment   F. UTERUS, CERVIX, BILATERAL FALLOPIAN TUBES AND OVARIES:   Uterus:  -  Endometrioid carcinoma, FIGO grade 2  -  Leiomyomata (1.6 cm ; largest)  -  Carcinoma present in lymphoid aggregate in serosal adhesions  (lymphovascular space invasion)  -  See oncology table and comment below   Cervix:  -  No carcinoma identified   Bilateral Ovaries:  -  No carcinoma identified   Bilateral Fallopian tubes:  -  Carcinoma within lumen of fallopian tube  (left)   G. PERIURETHRAL, BIOPSY:  -  No carcinoma identified    03/27/2020 Surgery   Robotic-assisted laparoscopic total hysterectomy with bilateral salpingoophorectomy, SLN biopsy   On EUA, very narrow introitus most due to tight hymenal ring. Small mobile uterus. Hyperpigmented 0.46m plaque on right mons, biopsied. Urethral overall  normal in appearance, given hyperemic appearance in clinic, biopsy taken. On intra-abdominal entry, some scarring noted on inferior aspect of the liver, anterior right diaphragm and left lobe of the liver. Omentum normal appearing. Mesentery of the small and large bowel as well as the bowel itself studded with 1-51m inflammatory-appearing nodules. Appendix with same studding, otherwise normal in appearance. Uterus 6cm and normal appearing with the exception of inflammatory exudate on posterior aspect of the fundus. Bilateral adnexa normal appearing. Chocolate brown staining within most of the cul-de-sac. Inflammatory appearing nodules studding bilateral pelvic walls, anterior and posterior cul-de-sac c/w endometriosis. No adenopathy. No intra-abdominal or pelvic evidence of disease.   03/2020 Initial Biopsy   EMB - gr 1-2 EMCA   03/27/2020 Cancer Staging   Staging form: Corpus Uteri - Carcinoma and  Carcinosarcoma, AJCC 8th Edition - Clinical stage from 03/27/2020: FIGO Stage IIIA (cT3a, cN0(sn), cM0) - Signed by GHeath Lark MD on 04/24/2020   04/23/2020 Imaging   Status post hysterectomy and bilateral salpingo-oophorectomy.   Mild stranding along the anterior transverse mesocolon. No frank peritoneal nodularity or omental caking. Attention on follow-up is suggested.   No findings specific for recurrent or metastatic disease.   Bladder is mildly thick-walled although underdistended.     04/27/2020 -  Chemotherapy   The patient had carboplatin and taxol for chemotherapy treatment.       REVIEW OF SYSTEMS:   Constitutional: Denies fevers, chills or abnormal weight loss Eyes: Denies blurriness of vision Ears, nose, mouth, throat, and face: Denies mucositis or sore throat Respiratory: Denies cough, dyspnea or wheezes Cardiovascular: Denies palpitation, chest discomfort or lower extremity swelling Gastrointestinal:  Denies nausea, heartburn or change in bowel habits Skin: Denies abnormal skin rashes Lymphatics: Denies new lymphadenopathy or easy bruising Behavioral/Psych: Mood is stable, no new changes  All other systems were reviewed with the patient and are negative.  I have reviewed the past medical history, past surgical history, social history and family history with the patient and they are unchanged from previous note.  ALLERGIES:  is allergic to ceftin [cefuroxime], crestor [rosuvastatin calcium], and latex.  MEDICATIONS:  Current Outpatient Medications  Medication Sig Dispense Refill  . Cholecalciferol (VITAMIN D) 125 MCG (5000 UT) CAPS Take 5,000 Units by mouth daily.    . famotidine (PEPCID) 20 MG tablet Take 20 mg by mouth 2 (two) times daily as needed for heartburn or indigestion.    . ondansetron (ZOFRAN) 8 MG tablet Take 1 tablet (8 mg total) by mouth every 8 (eight) hours as needed. Start on the third day after chemotherapy. 30 tablet 1  . pantoprazole (PROTONIX)  40 MG tablet Take 40 mg by mouth daily as needed (acid reduction).     . polyethylene glycol (MIRALAX / GLYCOLAX) 17 g packet Take 17 g by mouth daily as needed.    . prochlorperazine (COMPAZINE) 10 MG tablet Take 1 tablet (10 mg total) by mouth every 6 (six) hours as needed (Nausea or vomiting). 30 tablet 1  . senna-docusate (SENOKOT-S) 8.6-50 MG tablet Take 2 tablets by mouth at bedtime. For AFTER surgery, do not take if having diarrhea 30 tablet 0   No current facility-administered medications for this visit.    PHYSICAL EXAMINATION: ECOG PERFORMANCE STATUS: 1 - Symptomatic but completely ambulatory  Vitals:   08/17/20 0917  BP: (!) 119/58  Pulse: 85  Resp: 18  SpO2: 100%   Filed Weights   08/17/20 0917  Weight: 150 lb 9.6 oz (68.3 kg)  GENERAL:alert, no distress and comfortable Musculoskeletal:no cyanosis of digits and no clubbing  NEURO: alert & oriented x 3 with fluent speech, no focal motor/sensory deficits  LABORATORY DATA:  I have reviewed the data as listed    Component Value Date/Time   NA 138 08/17/2020 0848   K 3.8 08/17/2020 0848   CL 104 08/17/2020 0848   CO2 22 08/17/2020 0848   GLUCOSE 190 (H) 08/17/2020 0848   BUN 17 08/17/2020 0848   CREATININE 0.95 08/17/2020 0848   CREATININE 0.89 12/04/2015 0901   CALCIUM 9.3 08/17/2020 0848   PROT 7.4 08/17/2020 0848   ALBUMIN 4.2 08/17/2020 0848   AST 21 08/17/2020 0848   ALT 24 08/17/2020 0848   ALKPHOS 73 08/17/2020 0848   BILITOT 0.3 08/17/2020 0848   GFRNONAA >60 08/17/2020 0848   GFRAA >60 05/18/2020 0941    No results found for: SPEP, UPEP  Lab Results  Component Value Date   WBC 7.2 08/17/2020   NEUTROABS 6.1 08/17/2020   HGB 10.5 (L) 08/17/2020   HCT 30.4 (L) 08/17/2020   MCV 89.9 08/17/2020   PLT 113 (L) 08/17/2020      Chemistry      Component Value Date/Time   NA 138 08/17/2020 0848   K 3.8 08/17/2020 0848   CL 104 08/17/2020 0848   CO2 22 08/17/2020 0848   BUN 17 08/17/2020  0848   CREATININE 0.95 08/17/2020 0848   CREATININE 0.89 12/04/2015 0901      Component Value Date/Time   CALCIUM 9.3 08/17/2020 0848   ALKPHOS 73 08/17/2020 0848   AST 21 08/17/2020 0848   ALT 24 08/17/2020 0848   BILITOT 0.3 08/17/2020 0848

## 2020-08-17 NOTE — Progress Notes (Signed)
Patient completed infusion without incident. In no visible distress at time of discharge. Ambulated out of cancer center. AVS provided.  

## 2020-08-17 NOTE — Assessment & Plan Note (Signed)
She tolerated recent dose adjustment well Observe closely for now

## 2020-08-17 NOTE — Assessment & Plan Note (Signed)
She has significant generalized deconditioning I recommend referral to physical therapy and rehab

## 2020-08-17 NOTE — Assessment & Plan Note (Signed)
She has slight worsening pancytopenia from treatment which is not unexpected She does not need transfusion support but would probably need more time to recover from her blood count I plan to recheck her blood work again next month

## 2020-08-23 ENCOUNTER — Ambulatory Visit: Payer: Self-pay | Admitting: Radiation Oncology

## 2020-09-10 ENCOUNTER — Telehealth: Payer: Self-pay | Admitting: *Deleted

## 2020-09-10 ENCOUNTER — Other Ambulatory Visit: Payer: Self-pay | Admitting: *Deleted

## 2020-09-10 NOTE — Telephone Encounter (Signed)
Emily [name] left a message stating she has started having the joint pain that Dr Bertis Ruddy mentioned as a possibility. Has been taking Motrin and Flexeril (had some from another provider). Is requesting a refill of Flexeril.

## 2020-09-11 ENCOUNTER — Other Ambulatory Visit: Payer: Self-pay | Admitting: Hematology and Oncology

## 2020-09-11 MED ORDER — CYCLOBENZAPRINE HCL 10 MG PO TABS: 10.0000 mg | ORAL_TABLET | Freq: Three times a day (TID) | ORAL | 0 refills | Status: DC | PRN

## 2020-09-11 NOTE — Telephone Encounter (Signed)
Called back and left a message to call the office back. She left a earlier message that the flexeril is not working and she is looking for other suggestions.

## 2020-09-11 NOTE — Telephone Encounter (Signed)
Called back. She prefers virtual office visit for tomorrow. Appt scheduled and she is aware of appt time.

## 2020-09-11 NOTE — Telephone Encounter (Signed)
done

## 2020-09-11 NOTE — Telephone Encounter (Signed)
She called and left a message to call her.  Called back. She is [place] and has [place] power. She does not feel the Flexeril is helping. She is having deep hip pain when she sits down or lays down. She is asking about options. Gabapentin or a narcotic. She does not want any steroids. Heat does help and she has been applying ice. She has been taking at times Motrin 1200 mg in a 12 hour period.  She uses [facility] in [place] in [place]. She is not sure of the #. Schedule a virtual visit?

## 2020-09-12 ENCOUNTER — Other Ambulatory Visit: Payer: Self-pay

## 2020-09-12 ENCOUNTER — Telehealth (HOSPITAL_BASED_OUTPATIENT_CLINIC_OR_DEPARTMENT_OTHER): Payer: 59 | Admitting: Hematology and Oncology

## 2020-09-12 DIAGNOSIS — C541 Malignant neoplasm of endometrium: Secondary | ICD-10-CM

## 2020-09-12 DIAGNOSIS — M79604 Pain in right leg: Secondary | ICD-10-CM | POA: Diagnosis not present

## 2020-09-12 DIAGNOSIS — M79605 Pain in left leg: Secondary | ICD-10-CM | POA: Diagnosis not present

## 2020-09-12 MED ORDER — CYCLOBENZAPRINE HCL 10 MG PO TABS: 10.0000 mg | ORAL_TABLET | Freq: Three times a day (TID) | ORAL | 0 refills | Status: DC | PRN

## 2020-09-12 MED ORDER — TRAMADOL HCL 50 MG PO TABS: 50.0000 mg | ORAL_TABLET | Freq: Four times a day (QID) | ORAL | 0 refills | Status: DC | PRN

## 2020-09-13 DIAGNOSIS — M79605 Pain in left leg: Secondary | ICD-10-CM | POA: Insufficient documentation

## 2020-09-13 DIAGNOSIS — M79604 Pain in right leg: Secondary | ICD-10-CM | POA: Insufficient documentation

## 2020-09-13 NOTE — Progress Notes (Signed)
HEMATOLOGY-ONCOLOGY ELECTRONIC VISIT PROGRESS NOTE  Patient Care Team: [name], MD as PCP - General (Family Medicine)  I connected with by [facility] video conference but it ended up being a phone visit ASSESSMENT & PLAN:  Endometrial cancer [facility]) Her symptoms not related to recent side effects of chemotherapy I reassured her and we will try to manage her symptoms aggressively I will see her at the end of the month as scheduled  Bilateral leg pain The cause of her leg pain is not related to side effects of treatment We discussed conservative approach with rest, ice, compression and elevation She can take NSAID judiciously I will send in prescription of Flexeril to her local pharmacy We also discussed potential tramadol for severe pain Since she is reluctant to take tramadol but I encouraged her to try for severe pain   No orders of the defined types were placed in this encounter.   INTERVAL HISTORY: Please see below for problem oriented charting. The purpose of today's virtual visit is to address her severe bilateral leg pain The patient is currently in [place] living with her sister to recuperate from recent chemotherapy She started to be more active, attempting to walk whenever she can For the past few days, she started to have significant bilateral leg pain especially in the anterior shin area, occasionally radiating to her hips She did not have neurological deficits No recent injuries of fall The description she gave is not consistent with neuropathy as the areas affected and not with sensory neuropathy in her hands and feet She has been taking NSAID for pain control and it is gradually getting better At times, her pain would be very severe  SUMMARY OF ONCOLOGIC HISTORY: Oncology History Overview Note  IHC MMR normal Endometrioid MSI-stable   Endometrial cancer ([place])  02/28/2020 Initial Diagnosis   The patient presented for an exam on 6/22.  At that time she endorsed  episodes of postmenopausal bleeding starting in 07-29-19 after her mother's death.  She had not had a menses in a number of years.  Patient's exam was limited by her intolerance in the position of the cervix.  Pap test was performed on 6/30 showing atypical glandular cells of undetermined significance. 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 She describes about a year long history previously of clear vaginal discharge. She saw her PCP for this complaint and given what was felt to be urethral swelling, she was treated for a UTI (lab testing did not show an infection). She has not been sexually active since her 75s. She endorses increased urinary frequency. After a year, the clear discharge became yellow (and at one point looked green). The patient was seen again and a pap was attempted in the office but she was unable to tolerate this. She performed a vaginal swab on herself that found yeast. She used Monistat multiple times with improvement in her discharge. She then began to have pale pink discharge. At this time, she was seen by a Urogynecologist at [facility] and it sound like she had urodynamic testing. She describes a raw feeling and burning with her urine. She continues to have vaginal discharge which she notes has an ammonia-like smell. Her vaginal bleeding started again about a month ago. She briefly tried vaginal estrogen after seeing the Urogynecologist but she felt that the bleeding worsened as did the ammonia smell, so she stopped.   03/16/2020 Initial Diagnosis   Endometrial cancer ([place])   03/21/2020 Imaging  1. Abnormally thickened endometrial complex measuring up to 12 mm, presumably related to known history of endometrial carcinoma. 2. Underlying fibroid uterus as detailed above. 3. Normal sonographic evaluation of the ovaries. No abnormal free fluid. Please note that the appendix appears to be potentially located in close proximity to  the right ovary on this exam. As this patient is scheduled to undergo total hysterectomy with bilateral salpingo oophorectomy in the near future, close attention to this region at surgical intervention is recommended.     03/27/2020 Pathology Results   A. CUL DE SAC, ANTERIOR, BIOPSY:  -  Benign fibrovascular tissue with mesothelial hyperplasia  -  No carcinoma identified  -  See comment   B. MONS, RIGHT, BIOPSY:  -  Melanocytic nevus, intradermal type  -  See comment   C. LYMPH NODE, SENTINEL, RIGHT EXTERNAL ILIAC, BIOPSY:  -  No carcinoma identified in one lymph node (0/1)  -  See comment   D. PERITONEUM, LEFT SIDEWALL, BIOPSY:  -  Benign fibrovascular tissue with chronic inflammation  -  No carcinoma identified   E. LYMPH NODE, SENTINEL, LEFT EXTERNAL ILIAC, BIOPSY:  -  No carcinoma identified in one lymph node (0/1)  -  See comment   F. UTERUS, CERVIX, BILATERAL FALLOPIAN TUBES AND OVARIES:   Uterus:  -  Endometrioid carcinoma, FIGO grade 2  -  Leiomyomata (1.6 cm ; largest)  -  Carcinoma present in lymphoid aggregate in serosal adhesions  (lymphovascular space invasion)  -  See oncology table and comment below   Cervix:  -  No carcinoma identified   Bilateral Ovaries:  -  No carcinoma identified   Bilateral Fallopian tubes:  -  Carcinoma within lumen of fallopian tube  (left)   G. PERIURETHRAL, BIOPSY:  -  No carcinoma identified    03/27/2020 Surgery   Robotic-assisted laparoscopic total hysterectomy with bilateral salpingoophorectomy, SLN biopsy   On EUA, very narrow introitus most due to tight hymenal ring. Small mobile uterus. Hyperpigmented 0.32mm plaque on right mons, biopsied. Urethral overall normal in appearance, given hyperemic appearance in clinic, biopsy taken. On intra-abdominal entry, some scarring noted on inferior aspect of the liver, anterior right diaphragm and left lobe of the liver. Omentum normal appearing. Mesentery of the small and large bowel  as well as the bowel itself studded with 1-28mm inflammatory-appearing nodules. Appendix with same studding, otherwise normal in appearance. Uterus 6cm and normal appearing with the exception of inflammatory exudate on posterior aspect of the fundus. Bilateral adnexa normal appearing. Chocolate brown staining within most of the cul-de-sac. Inflammatory appearing nodules studding bilateral pelvic walls, anterior and posterior cul-de-sac c/w endometriosis. No adenopathy. No intra-abdominal or pelvic evidence of disease.   03/2020 Initial Biopsy   EMB - gr 1-2 EMCA   03/27/2020 Cancer Staging   Staging form: Corpus Uteri - Carcinoma and Carcinosarcoma, AJCC 8th Edition - Clinical stage from 03/27/2020: FIGO Stage IIIA (cT3a, cN0(sn), cM0) - Signed by [name], MD on 04/24/2020   04/23/2020 Imaging   Status post hysterectomy and bilateral salpingo-oophorectomy.   Mild stranding along the anterior transverse mesocolon. No frank peritoneal nodularity or omental caking. Attention on follow-up is suggested.   No findings specific for recurrent or metastatic disease.   Bladder is mildly thick-walled although underdistended.     04/27/2020 -  Chemotherapy   The patient had carboplatin and taxol for chemotherapy treatment.       REVIEW OF SYSTEMS:   Constitutional: Denies fevers, chills or abnormal weight  loss Eyes: Denies blurriness of vision Ears, nose, mouth, throat, and face: Denies mucositis or sore throat Respiratory: Denies cough, dyspnea or wheezes Cardiovascular: Denies palpitation, chest discomfort Gastrointestinal:  Denies nausea, heartburn or change in bowel habits Skin: Denies abnormal skin rashes Lymphatics: Denies new lymphadenopathy or easy bruising Neurological:Denies numbness, tingling or new weaknesses Behavioral/Psych: Mood is stable, no new changes  Extremities: No lower extremity edema All other systems were reviewed with the patient and are negative.  I have reviewed  the past medical history, past surgical history, social history and family history with the patient and they are unchanged from previous note.  ALLERGIES:  is allergic to ceftin [cefuroxime], crestor [rosuvastatin calcium], and latex.  MEDICATIONS:  Current Outpatient Medications  Medication Sig Dispense Refill  . traMADol (ULTRAM) 50 MG tablet Take 1 tablet (50 mg total) by mouth every 6 (six) hours as needed. 30 tablet 0  . Cholecalciferol (VITAMIN D) 125 MCG (5000 UT) CAPS Take 5,000 Units by mouth daily.    . cyclobenzaprine (FLEXERIL) 10 MG tablet Take 1 tablet (10 mg total) by mouth 3 (three) times daily as needed for muscle spasms. 30 tablet 0  . famotidine (PEPCID) 20 MG tablet Take 20 mg by mouth 2 (two) times daily as needed for heartburn or indigestion.    . ondansetron (ZOFRAN) 8 MG tablet Take 1 tablet (8 mg total) by mouth every 8 (eight) hours as needed. Start on the third day after chemotherapy. 30 tablet 1  . pantoprazole (PROTONIX) 40 MG tablet Take 40 mg by mouth daily as needed (acid reduction).     . polyethylene glycol (MIRALAX / GLYCOLAX) 17 g packet Take 17 g by mouth daily as needed.    . prochlorperazine (COMPAZINE) 10 MG tablet Take 1 tablet (10 mg total) by mouth every 6 (six) hours as needed (Nausea or vomiting). 30 tablet 1  . senna-docusate (SENOKOT-S) 8.6-50 MG tablet Take 2 tablets by mouth at bedtime. For AFTER surgery, do not take if having diarrhea 30 tablet 0   No current facility-administered medications for this visit.    PHYSICAL EXAMINATION: ECOG PERFORMANCE STATUS: 1 - Symptomatic but completely ambulatory  LABORATORY DATA:  I have reviewed the data as listed CMP Latest Ref Rng & Units 08/17/2020 07/27/2020 07/03/2020  Glucose 70 - 99 mg/dL 190(H) 115(H) 184(H)  BUN 6 - 20 mg/dL 17 26(H) 20  Creatinine 0.44 - 1.00 mg/dL 0.95 0.88 0.83  Sodium 135 - 145 mmol/L 138 139 140  Potassium 3.5 - 5.1 mmol/L 3.8 3.8 3.6  Chloride 98 - 111 mmol/L 104 104  106  CO2 22 - 32 mmol/L $RemoveB'22 23 22  'ihnZUIMT$ Calcium 8.9 - 10.3 mg/dL 9.3 9.4 9.9  Total Protein 6.5 - 8.1 g/dL 7.4 7.4 7.4  Total Bilirubin 0.3 - 1.2 mg/dL 0.3 0.3 0.2(L)  Alkaline Phos 38 - 126 U/L 73 72 82  AST 15 - 41 U/L $Remo'21 18 28  'eCJix$ ALT 0 - 44 U/L 24 22 39    Lab Results  Component Value Date   WBC 7.2 08/17/2020   HGB 10.5 (L) 08/17/2020   HCT 30.4 (L) 08/17/2020   MCV 89.9 08/17/2020   PLT 113 (L) 08/17/2020   NEUTROABS 6.1 08/17/2020     I discussed the assessment and treatment plan with the patient. The patient was provided an opportunity to ask questions and all were answered. The patient agreed with the plan and demonstrated an understanding of the instructions. The patient was advised to call  back or seek an in-person evaluation if the symptoms worsen or if the condition fails to improve as anticipated.    I spent 20 minutes for the appointment reviewing test results, discuss management and coordination of care.  [name], MD 09/13/2020 8:41 AM

## 2020-09-13 NOTE — Assessment & Plan Note (Signed)
Her symptoms not related to recent side effects of chemotherapy I reassured her and we will try to manage her symptoms aggressively I will see her at the end of the month as scheduled

## 2020-09-13 NOTE — Assessment & Plan Note (Signed)
The cause of her leg pain is not related to side effects of treatment We discussed conservative approach with rest, ice, compression and elevation She can take NSAID judiciously I will send in prescription of Flexeril to her local pharmacy We also discussed potential tramadol for severe pain Since she is reluctant to take tramadol but I encouraged her to try for severe pain

## 2020-09-17 ENCOUNTER — Telehealth: Payer: Self-pay

## 2020-09-17 NOTE — Telephone Encounter (Signed)
Called and scheduled virtual visit on 1/13 at 1:40 pm with Dr. Alvy Bimler to [facility] completing attending physician statement.

## 2020-09-19 ENCOUNTER — Telehealth: Payer: Self-pay | Admitting: *Deleted

## 2020-09-19 NOTE — Telephone Encounter (Signed)
Per phone request from [facility], fax last two of Dr Alvy Bimler office notes

## 2020-09-20 ENCOUNTER — Inpatient Hospital Stay: Payer: 59 | Attending: Gynecologic Oncology | Admitting: Hematology and Oncology

## 2020-09-20 ENCOUNTER — Other Ambulatory Visit: Payer: Self-pay

## 2020-09-20 ENCOUNTER — Telehealth: Payer: Self-pay | Admitting: *Deleted

## 2020-09-20 DIAGNOSIS — T451X5A Adverse effect of antineoplastic and immunosuppressive drugs, initial encounter: Secondary | ICD-10-CM | POA: Diagnosis not present

## 2020-09-20 DIAGNOSIS — Z90722 Acquired absence of ovaries, bilateral: Secondary | ICD-10-CM | POA: Insufficient documentation

## 2020-09-20 DIAGNOSIS — Z800 Family history of malignant neoplasm of digestive organs: Secondary | ICD-10-CM | POA: Insufficient documentation

## 2020-09-20 DIAGNOSIS — E7800 Pure hypercholesterolemia, unspecified: Secondary | ICD-10-CM | POA: Insufficient documentation

## 2020-09-20 DIAGNOSIS — C541 Malignant neoplasm of endometrium: Secondary | ICD-10-CM | POA: Diagnosis not present

## 2020-09-20 DIAGNOSIS — M79604 Pain in right leg: Secondary | ICD-10-CM

## 2020-09-20 DIAGNOSIS — M79605 Pain in left leg: Secondary | ICD-10-CM

## 2020-09-20 DIAGNOSIS — G620 Drug-induced polyneuropathy: Secondary | ICD-10-CM | POA: Diagnosis not present

## 2020-09-20 DIAGNOSIS — E785 Hyperlipidemia, unspecified: Secondary | ICD-10-CM | POA: Insufficient documentation

## 2020-09-20 DIAGNOSIS — M8580 Other specified disorders of bone density and structure, unspecified site: Secondary | ICD-10-CM | POA: Insufficient documentation

## 2020-09-20 DIAGNOSIS — Z9079 Acquired absence of other genital organ(s): Secondary | ICD-10-CM | POA: Insufficient documentation

## 2020-09-20 DIAGNOSIS — Z833 Family history of diabetes mellitus: Secondary | ICD-10-CM | POA: Insufficient documentation

## 2020-09-20 DIAGNOSIS — R5381 Other malaise: Secondary | ICD-10-CM

## 2020-09-20 DIAGNOSIS — K219 Gastro-esophageal reflux disease without esophagitis: Secondary | ICD-10-CM | POA: Insufficient documentation

## 2020-09-20 DIAGNOSIS — Z79899 Other long term (current) drug therapy: Secondary | ICD-10-CM | POA: Insufficient documentation

## 2020-09-20 DIAGNOSIS — Z87440 Personal history of urinary (tract) infections: Secondary | ICD-10-CM | POA: Insufficient documentation

## 2020-09-20 DIAGNOSIS — Z8261 Family history of arthritis: Secondary | ICD-10-CM | POA: Insufficient documentation

## 2020-09-20 DIAGNOSIS — Z8249 Family history of ischemic heart disease and other diseases of the circulatory system: Secondary | ICD-10-CM | POA: Insufficient documentation

## 2020-09-20 DIAGNOSIS — Z90710 Acquired absence of both cervix and uterus: Secondary | ICD-10-CM | POA: Insufficient documentation

## 2020-09-20 NOTE — Assessment & Plan Note (Signed)
Her leg pain is improving She has pain medicine to take as needed

## 2020-09-20 NOTE — Progress Notes (Signed)
HEMATOLOGY-ONCOLOGY ELECTRONIC VISIT PROGRESS NOTE  Patient Care Team: [name], MD as PCP - General (Family Medicine)  I connected with by [facility] video conference and verified that I am speaking with the correct person using two identifiers.  I discussed the limitations, risks, security and privacy concerns of performing an evaluation and management service by [employer] and the availability of in person appointments.  I also discussed with the patient that there may be a patient responsible charge related to this service. The patient expressed understanding and agreed to proceed.   ASSESSMENT & PLAN:  Endometrial cancer ([place]) She is still recovering from side effects from treatment Even though her energy level is a bit better, she continues to have excessive fatigue and difficulties with concentrating She will return next week for blood work and imaging studies as scheduled Continue supportive  Physical deconditioning Overall, she is gradually improving but less than 50% of her baseline performance status She is living with her sister who is taking care of her It is not likely that she will be able to return back to work anytime soon I recommend 6 more weeks of rest I filled out her disability paperwork and certify her to return back to work after November 05, 2020   Peripheral neuropathy due to chemotherapy [facility]) Her neuropathy is stable/improved Observe closely for now  Bilateral leg pain Her leg pain is improving She has pain medicine to take as needed   No orders of the defined types were placed in this encounter.   INTERVAL HISTORY: Please see below for problem oriented charting. The purpose of today's visit is to discuss about her functional ability and whether she can return back to work I received fax from her insurance company with FMLA paperwork to fill out, regarding the timing when she can return back to work Since last time I talked to her, her leg pain has  slightly improved She has not used regular doses of ibuprofen or tramadol that I prescribed However, she continues to have excessive fatigue and difficulties with concentration She is living with her sister right now and dependent on her for activities of daily living She confirmed that she will return next week for CT imaging and follow-up but felt overall, she is not improved to the point that she can return back to work soon  [place]: Oncology History Overview Note  IHC MMR normal Endometrioid MSI-stable   Endometrial cancer ([place])  02/28/2020 Initial Diagnosis   The patient presented for an exam on 6/22.  At that time she endorsed episodes of postmenopausal bleeding starting in 07-26-19 after her mother's death.  She had not had a menses in a number of years.  Patient's exam was limited by her intolerance in the position of the cervix.  Pap test was performed on 6/30 showing atypical glandular cells of undetermined significance. 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 She describes about a year long history previously of clear vaginal discharge. She saw her PCP for this complaint and given what was felt to be urethral swelling, she was treated for a UTI (lab testing did not show an infection). She has not been sexually active since her 34s. She endorses increased urinary frequency. After a year, the clear discharge became yellow (and at one point looked green). The patient was seen again and a pap was attempted in the office but she was unable to tolerate this. She performed a vaginal swab on herself  that found yeast. She used Monistat multiple times with improvement in her discharge. She then began to have pale pink discharge. At this time, she was seen by a Urogynecologist at [facility] and it sound like she had urodynamic testing. She describes a raw feeling and burning with her urine. She continues to have vaginal  discharge which she notes has an ammonia-like smell. Her vaginal bleeding started again about a month ago. She briefly tried vaginal estrogen after seeing the Urogynecologist but she felt that the bleeding worsened as did the ammonia smell, so she stopped.   03/16/2020 Initial Diagnosis   Endometrial cancer ([place])   03/21/2020 Imaging   1. Abnormally thickened endometrial complex measuring up to 12 mm, presumably related to known history of endometrial carcinoma. 2. Underlying fibroid uterus as detailed above. 3. Normal sonographic evaluation of the ovaries. No abnormal free fluid. Please note that the appendix appears to be potentially located in close proximity to the right ovary on this exam. As this patient is scheduled to undergo total hysterectomy with bilateral salpingo oophorectomy in the near future, close attention to this region at surgical intervention is recommended.     03/27/2020 Pathology Results   A. CUL DE SAC, ANTERIOR, BIOPSY:  -  Benign fibrovascular tissue with mesothelial hyperplasia  -  No carcinoma identified  -  See comment   B. MONS, RIGHT, BIOPSY:  -  Melanocytic nevus, intradermal type  -  See comment   C. LYMPH NODE, SENTINEL, RIGHT EXTERNAL ILIAC, BIOPSY:  -  No carcinoma identified in one lymph node (0/1)  -  See comment   D. PERITONEUM, LEFT SIDEWALL, BIOPSY:  -  Benign fibrovascular tissue with chronic inflammation  -  No carcinoma identified   E. LYMPH NODE, SENTINEL, LEFT EXTERNAL ILIAC, BIOPSY:  -  No carcinoma identified in one lymph node (0/1)  -  See comment   F. UTERUS, CERVIX, BILATERAL FALLOPIAN TUBES AND OVARIES:   Uterus:  -  Endometrioid carcinoma, FIGO grade 2  -  Leiomyomata (1.6 cm ; largest)  -  Carcinoma present in lymphoid aggregate in serosal adhesions  (lymphovascular space invasion)  -  See oncology table and comment below   Cervix:  -  No carcinoma identified   Bilateral Ovaries:  -  No carcinoma identified   Bilateral  Fallopian tubes:  -  Carcinoma within lumen of fallopian tube  (left)   G. PERIURETHRAL, BIOPSY:  -  No carcinoma identified    03/27/2020 Surgery   Robotic-assisted laparoscopic total hysterectomy with bilateral salpingoophorectomy, SLN biopsy   On EUA, very narrow introitus most due to tight hymenal ring. Small mobile uterus. Hyperpigmented 0.48m plaque on right mons, biopsied. Urethral overall normal in appearance, given hyperemic appearance in clinic, biopsy taken. On intra-abdominal entry, some scarring noted on inferior aspect of the liver, anterior right diaphragm and left lobe of the liver. Omentum normal appearing. Mesentery of the small and large bowel as well as the bowel itself studded with 1-23minflammatory-appearing nodules. Appendix with same studding, otherwise normal in appearance. Uterus 6cm and normal appearing with the exception of inflammatory exudate on posterior aspect of the fundus. Bilateral adnexa normal appearing. Chocolate brown staining within most of the cul-de-sac. Inflammatory appearing nodules studding bilateral pelvic walls, anterior and posterior cul-de-sac c/w endometriosis. No adenopathy. No intra-abdominal or pelvic evidence of disease.   03/2020 Initial Biopsy   EMB - gr 1-2 EMCA   03/27/2020 Cancer Staging   Staging form: Corpus Uteri -  Carcinoma and Carcinosarcoma, AJCC 8th Edition - Clinical stage from 03/27/2020: FIGO Stage IIIA (cT3a, cN0(sn), cM0) - Signed by [name], MD on 04/24/2020   04/23/2020 Imaging   Status post hysterectomy and bilateral salpingo-oophorectomy.   Mild stranding along the anterior transverse mesocolon. No frank peritoneal nodularity or omental caking. Attention on follow-up is suggested.   No findings specific for recurrent or metastatic disease.   Bladder is mildly thick-walled although underdistended.     04/27/2020 -  Chemotherapy   The patient had carboplatin and taxol for chemotherapy treatment.       REVIEW OF  SYSTEMS:   Constitutional: Denies fevers, chills or abnormal weight loss Eyes: Denies blurriness of vision Ears, nose, mouth, throat, and face: Denies mucositis or sore throat Respiratory: Denies cough, dyspnea or wheezes Cardiovascular: Denies palpitation, chest discomfort Gastrointestinal:  Denies nausea, heartburn or change in bowel habits Skin: Denies abnormal skin rashes Lymphatics: Denies new lymphadenopathy or easy bruising Neurological:Denies numbness, tingling or new weaknesses Behavioral/Psych: Mood is stable, no new changes  Extremities: No lower extremity edema All other systems were reviewed with the patient and are negative.  I have reviewed the past medical history, past surgical history, social history and family history with the patient and they are unchanged from previous note.  ALLERGIES:  is allergic to ceftin [cefuroxime], crestor [rosuvastatin calcium], and latex.  MEDICATIONS:  Current Outpatient Medications  Medication Sig Dispense Refill  . Cholecalciferol (VITAMIN D) 125 MCG (5000 UT) CAPS Take 5,000 Units by mouth daily.    . cyclobenzaprine (FLEXERIL) 10 MG tablet Take 1 tablet (10 mg total) by mouth 3 (three) times daily as needed for muscle spasms. 30 tablet 0  . famotidine (PEPCID) 20 MG tablet Take 20 mg by mouth 2 (two) times daily as needed for heartburn or indigestion.    . ondansetron (ZOFRAN) 8 MG tablet Take 1 tablet (8 mg total) by mouth every 8 (eight) hours as needed. Start on the third day after chemotherapy. 30 tablet 1  . pantoprazole (PROTONIX) 40 MG tablet Take 40 mg by mouth daily as needed (acid reduction).     . polyethylene glycol (MIRALAX / GLYCOLAX) 17 g packet Take 17 g by mouth daily as needed.    . prochlorperazine (COMPAZINE) 10 MG tablet Take 1 tablet (10 mg total) by mouth every 6 (six) hours as needed (Nausea or vomiting). 30 tablet 1  . senna-docusate (SENOKOT-S) 8.6-50 MG tablet Take 2 tablets by mouth at bedtime. For AFTER  surgery, do not take if having diarrhea 30 tablet 0  . traMADol (ULTRAM) 50 MG tablet Take 1 tablet (50 mg total) by mouth every 6 (six) hours as needed. 30 tablet 0   No current facility-administered medications for this visit.    PHYSICAL EXAMINATION: ECOG PERFORMANCE STATUS: 1 - Symptomatic but completely ambulatory  LABORATORY DATA:  I have reviewed the data as listed CMP Latest Ref Rng & Units 08/17/2020 07/27/2020 07/03/2020  Glucose 70 - 99 mg/dL 190(H) 115(H) 184(H)  BUN 6 - 20 mg/dL 17 26(H) 20  Creatinine 0.44 - 1.00 mg/dL 0.95 0.88 0.83  Sodium 135 - 145 mmol/L 138 139 140  Potassium 3.5 - 5.1 mmol/L 3.8 3.8 3.6  Chloride 98 - 111 mmol/L 104 104 106  CO2 22 - 32 mmol/L _0 Calcium 8.9 - 10.3 mg/dL 9.3 9.4 9.9  Total Protein 6.5 - 8.1 g/dL 7.4 7.4 7.4  Total Bilirubin 0.3 - 1.2 mg/dL 0.3 0.3 0.2(L)  Alkaline Phos  38 - 126 U/L 73 72 82  AST 15 - 41 U/L _0 ALT 0 - 44 U/L 24 22 39    Lab Results  Component Value Date   WBC 7.2 08/17/2020   HGB 10.5 (L) 08/17/2020   HCT 30.4 (L) 08/17/2020   MCV 89.9 08/17/2020   PLT 113 (L) 08/17/2020   NEUTROABS 6.1 08/17/2020    I discussed the assessment and treatment plan with the patient. The patient was provided an opportunity to ask questions and all were answered. The patient agreed with the plan and demonstrated an understanding of the instructions. The patient was advised to call back or seek an in-person evaluation if the symptoms worsen or if the condition fails to improve as anticipated.    I spent 20 minutes for the appointment reviewing test results, discuss management and coordination of care.  [name], MD 09/20/2020 1:37 PM

## 2020-09-20 NOTE — Assessment & Plan Note (Signed)
Her neuropathy is stable/improved Observe closely for now

## 2020-09-20 NOTE — Assessment & Plan Note (Addendum)
She is still recovering from side effects from treatment Even though her energy level is a bit better, she continues to have excessive fatigue and difficulties with concentrating She will return next week for blood work and imaging studies as scheduled Continue supportive

## 2020-09-20 NOTE — Telephone Encounter (Signed)
Received request from [facility] to fax last two office notes from Dr. Sondra Come to [phone] with claim # [id number] on it.  Return call to [phone] Ext [phone] if have any quesitons.

## 2020-09-20 NOTE — Assessment & Plan Note (Signed)
Overall, she is gradually improving but less than 50% of her baseline performance status She is living with her sister who is taking care of her It is not likely that she will be able to return back to work anytime soon I recommend 6 more weeks of rest I filled out her disability paperwork and certify her to return back to work after November 05, 2020

## 2020-09-24 ENCOUNTER — Ambulatory Visit
Admission: RE | Admit: 2020-09-24 | Discharge: 2020-09-24 | Disposition: A | Discharge status: Home / Self Care | Payer: 59 | Source: Ambulatory Visit | Attending: Radiation Oncology | Admitting: Radiation Oncology

## 2020-09-24 ENCOUNTER — Telehealth: Payer: Self-pay | Admitting: *Deleted

## 2020-09-24 NOTE — Telephone Encounter (Signed)
CALLED PATIENT TO ASK ABOUT RESCHEDULING FU FOR 09-24-20, PATIENT AGREED TO COME ON 10-15-20 @ 11 AM

## 2020-09-27 ENCOUNTER — Inpatient Hospital Stay: Payer: 59

## 2020-09-27 ENCOUNTER — Ambulatory Visit (HOSPITAL_COMMUNITY): Payer: 59

## 2020-09-28 ENCOUNTER — Inpatient Hospital Stay: Payer: 59 | Admitting: Hematology and Oncology

## 2020-10-03 ENCOUNTER — Telehealth: Payer: Self-pay

## 2020-10-03 ENCOUNTER — Inpatient Hospital Stay: Payer: 59

## 2020-10-03 ENCOUNTER — Ambulatory Visit (HOSPITAL_COMMUNITY): Payer: 59

## 2020-10-03 ENCOUNTER — Other Ambulatory Visit: Payer: Self-pay

## 2020-10-03 DIAGNOSIS — Z8261 Family history of arthritis: Secondary | ICD-10-CM | POA: Diagnosis not present

## 2020-10-03 DIAGNOSIS — Z87440 Personal history of urinary (tract) infections: Secondary | ICD-10-CM | POA: Diagnosis not present

## 2020-10-03 DIAGNOSIS — Z90722 Acquired absence of ovaries, bilateral: Secondary | ICD-10-CM | POA: Diagnosis not present

## 2020-10-03 DIAGNOSIS — Z90710 Acquired absence of both cervix and uterus: Secondary | ICD-10-CM | POA: Diagnosis not present

## 2020-10-03 DIAGNOSIS — C541 Malignant neoplasm of endometrium: Secondary | ICD-10-CM | POA: Diagnosis not present

## 2020-10-03 DIAGNOSIS — Z8249 Family history of ischemic heart disease and other diseases of the circulatory system: Secondary | ICD-10-CM | POA: Diagnosis not present

## 2020-10-03 DIAGNOSIS — Z9079 Acquired absence of other genital organ(s): Secondary | ICD-10-CM | POA: Diagnosis not present

## 2020-10-03 DIAGNOSIS — K219 Gastro-esophageal reflux disease without esophagitis: Secondary | ICD-10-CM | POA: Diagnosis not present

## 2020-10-03 DIAGNOSIS — Z79899 Other long term (current) drug therapy: Secondary | ICD-10-CM | POA: Diagnosis not present

## 2020-10-03 DIAGNOSIS — M8580 Other specified disorders of bone density and structure, unspecified site: Secondary | ICD-10-CM | POA: Diagnosis not present

## 2020-10-03 DIAGNOSIS — D61818 Other pancytopenia: Secondary | ICD-10-CM

## 2020-10-03 DIAGNOSIS — E785 Hyperlipidemia, unspecified: Secondary | ICD-10-CM | POA: Diagnosis not present

## 2020-10-03 DIAGNOSIS — E7800 Pure hypercholesterolemia, unspecified: Secondary | ICD-10-CM | POA: Diagnosis not present

## 2020-10-03 DIAGNOSIS — D539 Nutritional anemia, unspecified: Secondary | ICD-10-CM

## 2020-10-03 DIAGNOSIS — Z800 Family history of malignant neoplasm of digestive organs: Secondary | ICD-10-CM | POA: Diagnosis not present

## 2020-10-03 DIAGNOSIS — Z833 Family history of diabetes mellitus: Secondary | ICD-10-CM | POA: Diagnosis not present

## 2020-10-03 DIAGNOSIS — G620 Drug-induced polyneuropathy: Secondary | ICD-10-CM | POA: Diagnosis not present

## 2020-10-03 LAB — CBC WITH DIFFERENTIAL (CANCER CENTER ONLY)
Abs Immature Granulocytes: 0.04 10*3/uL (ref 0.00–0.07)
Basophils Absolute: 0.1 10*3/uL (ref 0.0–0.1)
Basophils Relative: 1 %
Eosinophils Absolute: 0.1 10*3/uL (ref 0.0–0.5)
Eosinophils Relative: 2 %
HCT: 32 % — ABNORMAL LOW (ref 36.0–46.0)
Hemoglobin: 10.5 g/dL — ABNORMAL LOW (ref 12.0–15.0)
Immature Granulocytes: 1 %
Lymphocytes Relative: 26 %
Lymphs Abs: 2.1 10*3/uL (ref 0.7–4.0)
MCH: 31.6 pg (ref 26.0–34.0)
MCHC: 32.8 g/dL (ref 30.0–36.0)
MCV: 96.4 fL (ref 80.0–100.0)
Monocytes Absolute: 0.6 10*3/uL (ref 0.1–1.0)
Monocytes Relative: 7 %
Neutro Abs: 5.2 10*3/uL (ref 1.7–7.7)
Neutrophils Relative %: 63 %
Platelet Count: 195 10*3/uL (ref 150–400)
RBC: 3.32 MIL/uL — ABNORMAL LOW (ref 3.87–5.11)
RDW: 14.2 % (ref 11.5–15.5)
WBC Count: 8.2 10*3/uL (ref 4.0–10.5)
nRBC: 0 % (ref 0.0–0.2)

## 2020-10-03 LAB — CMP (CANCER CENTER ONLY)
ALT: 12 U/L (ref 0–44)
AST: 19 U/L (ref 15–41)
Albumin: 4.4 g/dL (ref 3.5–5.0)
Alkaline Phosphatase: 67 U/L (ref 38–126)
Anion gap: 11 (ref 5–15)
BUN: 29 mg/dL — ABNORMAL HIGH (ref 6–20)
CO2: 24 mmol/L (ref 22–32)
Calcium: 9.1 mg/dL (ref 8.9–10.3)
Chloride: 106 mmol/L (ref 98–111)
Creatinine: 0.87 mg/dL (ref 0.44–1.00)
GFR, Estimated: >60 mL/min (ref >60)
Glucose, Bld: 108 mg/dL — ABNORMAL HIGH (ref 70–99)
Potassium: 3.7 mmol/L (ref 3.5–5.1)
Sodium: 141 mmol/L (ref 135–145)
Total Bilirubin: 0.3 mg/dL (ref 0.3–1.2)
Total Protein: 7.3 g/dL (ref 6.5–8.1)

## 2020-10-03 LAB — VITAMIN B12: Vitamin B-12: 188 pg/mL (ref 180–914)

## 2020-10-03 NOTE — Telephone Encounter (Signed)
Pt called in left voicemail requesting a referral to a urologist stating she feels her urethra is swollen pt states this has been noted for 1 week this nurse reviewed voicemail and forwarded to provider

## 2020-10-04 ENCOUNTER — Ambulatory Visit (HOSPITAL_COMMUNITY)
Admission: RE | Admit: 2020-10-04 | Discharge: 2020-10-04 | Disposition: A | Discharge status: Home / Self Care | Payer: 59 | Source: Ambulatory Visit | Attending: Hematology and Oncology | Admitting: Hematology and Oncology

## 2020-10-04 DIAGNOSIS — C541 Malignant neoplasm of endometrium: Secondary | ICD-10-CM | POA: Diagnosis not present

## 2020-10-04 DIAGNOSIS — Z90710 Acquired absence of both cervix and uterus: Secondary | ICD-10-CM | POA: Diagnosis not present

## 2020-10-04 LAB — FERRITIN: Ferritin: 73 ng/mL (ref 11–307)

## 2020-10-04 LAB — IRON AND TIBC
Iron: 68 ug/dL (ref 41–142)
Saturation Ratios: 18 % — ABNORMAL LOW (ref 21–57)
TIBC: 371 ug/dL (ref 236–444)
UIBC: 303 ug/dL (ref 120–384)

## 2020-10-04 MED ORDER — IOHEXOL 300 MG/ML  SOLN
100.0000 mL | Freq: Once | INTRAMUSCULAR | Status: AC | PRN
  Administered 2020-10-04: 100 mL via INTRAVENOUS

## 2020-10-05 ENCOUNTER — Other Ambulatory Visit: Payer: Self-pay

## 2020-10-05 ENCOUNTER — Encounter: Payer: Self-pay | Admitting: Gynecologic Oncology

## 2020-10-05 ENCOUNTER — Inpatient Hospital Stay: Payer: 59 | Admitting: Hematology and Oncology

## 2020-10-05 ENCOUNTER — Telehealth: Payer: Self-pay | Admitting: Oncology

## 2020-10-05 ENCOUNTER — Inpatient Hospital Stay: Payer: 59

## 2020-10-05 ENCOUNTER — Encounter: Payer: Self-pay | Admitting: Hematology and Oncology

## 2020-10-05 VITALS — BP 106/56 | HR 77 | Temp 97.5°F | Resp 18 | Ht 64.0 in | Wt 148.6 lb

## 2020-10-05 DIAGNOSIS — T451X5A Adverse effect of antineoplastic and immunosuppressive drugs, initial encounter: Secondary | ICD-10-CM | POA: Diagnosis not present

## 2020-10-05 DIAGNOSIS — C541 Malignant neoplasm of endometrium: Secondary | ICD-10-CM

## 2020-10-05 DIAGNOSIS — R300 Dysuria: Secondary | ICD-10-CM | POA: Diagnosis not present

## 2020-10-05 DIAGNOSIS — D6481 Anemia due to antineoplastic chemotherapy: Secondary | ICD-10-CM | POA: Diagnosis not present

## 2020-10-05 DIAGNOSIS — K219 Gastro-esophageal reflux disease without esophagitis: Secondary | ICD-10-CM | POA: Diagnosis not present

## 2020-10-05 DIAGNOSIS — M8580 Other specified disorders of bone density and structure, unspecified site: Secondary | ICD-10-CM | POA: Diagnosis not present

## 2020-10-05 DIAGNOSIS — Z90710 Acquired absence of both cervix and uterus: Secondary | ICD-10-CM | POA: Diagnosis not present

## 2020-10-05 DIAGNOSIS — R5381 Other malaise: Secondary | ICD-10-CM

## 2020-10-05 DIAGNOSIS — G620 Drug-induced polyneuropathy: Secondary | ICD-10-CM

## 2020-10-05 DIAGNOSIS — E7800 Pure hypercholesterolemia, unspecified: Secondary | ICD-10-CM | POA: Diagnosis not present

## 2020-10-05 DIAGNOSIS — Z87440 Personal history of urinary (tract) infections: Secondary | ICD-10-CM | POA: Diagnosis not present

## 2020-10-05 DIAGNOSIS — Z79899 Other long term (current) drug therapy: Secondary | ICD-10-CM | POA: Diagnosis not present

## 2020-10-05 DIAGNOSIS — E785 Hyperlipidemia, unspecified: Secondary | ICD-10-CM | POA: Diagnosis not present

## 2020-10-05 LAB — URINALYSIS, COMPLETE (UACMP) WITH MICROSCOPIC
Bilirubin Urine: NEGATIVE
Glucose, UA: NEGATIVE mg/dL
Ketones, ur: NEGATIVE mg/dL
Nitrite: NEGATIVE
Protein, ur: NEGATIVE mg/dL
Specific Gravity, Urine: 1.024 (ref 1.005–1.030)
pH: 5 (ref 5.0–8.0)

## 2020-10-05 NOTE — Assessment & Plan Note (Signed)
I told the patient peripheral neuropathy from chemotherapy will resolve in the future Vitamin B12 deficiency can also cause neuropathy and I recommend her to take vitamin B12 supplement

## 2020-10-05 NOTE — Progress Notes (Signed)
[facility] OFFICE PROGRESS NOTE  Patient Care Team: [name], MD as PCP - General (Family Medicine)  ASSESSMENT & PLAN:  Endometrial cancer [facility]) I reviewed original pathology with the patient and her sister The patient started reading about different types of uterine cancer I told her that the patient had endometrioid subtype which is a common type of pathology She is concerned with her postsurgical CT imaging which show significant abnormalities including bladder wall thickening and others and is wondering whether she could still have disease within her abdomen I told her the limitation of abnormal findings after surgery I told the patient she has complete resection and we discussed again the role of adjuvant chemotherapy and radiation treatment Her latest scan confirmed complete response to therapy without any residual disease I continue to reassure the patient that at this point in time, she is considered cancer free I have scheduled close follow-up with her GYN oncologist early next week for further evaluation and assessment  Anemia due to antineoplastic chemotherapy The most likely cause of her persistent anemia is due to residual bone marrow suppressive effect from her recent chemotherapy However, additional work-up show she has borderline low vitamin B12 level I recommend oral vitamin B12 supplement  Physical deconditioning She has significant physical deconditioning I will refer her back to physical therapy and rehab At this point in time, she is not ready to go back to work for another month  Peripheral neuropathy due to chemotherapy [facility]) I told the patient peripheral neuropathy from chemotherapy will resolve in the future Vitamin B12 deficiency can also cause neuropathy and I recommend her to take vitamin B12 supplement  Dysuria We spent half the time discussing about her bladder symptoms The patient is having urethral discharge and discomfort when she  urinate I recommend urinalysis and urine culture for evaluation The patient wants to show me the collection of her pads with urethral discharge and is really interested to meet with a urologist for assessment and evaluation She is fearful, with abnormal bladder thickening seen on original CT imaging, that she might have concurrent bladder cancer or invasion of her uterine cancer to the bladder She is interested to consider having a cystoscopy done I recommend evaluation by her GYN oncologist first early next week I also recommend physical therapy to help with pelvic floor dysfunction that could improve her bladder control long-term   Orders Placed This Encounter  Procedures  . Urine Culture    Standing Status:   Future    Number of Occurrences:   1    Standing Expiration Date:   10/05/2021  . Urinalysis, Complete w Microscopic    Standing Status:   Future    Number of Occurrences:   1    Standing Expiration Date:   10/05/2021    All questions were answered. The patient knows to call the clinic with any problems, questions or concerns. The total time spent in the appointment was 30 minutes encounter with patients including review of chart and various tests results, discussions about plan of care and coordination of care plan   [name], MD 10/05/2020 10:13 AM  INTERVAL HISTORY: Please see below for problem oriented charting. She returns with her sister for further follow-up Another sister is available over the telephone Since last time I saw her, her leg pain has improved but she has now significant concern regarding her bladder She felt that she had urethral discharge and discomfort when she urinate She also has to wear  a pad due to bladder leakage Her energy level is improving since chemotherapy but she is still having excessive fatigue and difficulties with concentration At most, she is at 60% of her baseline and would like to see physical therapy  SUMMARY OF ONCOLOGIC  HISTORY: Oncology History Overview Note  IHC MMR normal Endometrioid MSI-stable   Endometrial cancer ([place])  02/28/2020 Initial Diagnosis   The patient presented for an exam on 6/22.  At that time she endorsed episodes of postmenopausal bleeding starting in Jul 19, 2019 after her mother's death.  She had not had a menses in a number of years.  Patient's exam was limited by her intolerance in the position of the cervix.  Pap test was performed on 6/30 showing atypical glandular cells of undetermined significance. 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 She describes about a year long history previously of clear vaginal discharge. She saw her PCP for this complaint and given what was felt to be urethral swelling, she was treated for a UTI (lab testing did not show an infection). She has not been sexually active since her 96s. She endorses increased urinary frequency. After a year, the clear discharge became yellow (and at one point looked green). The patient was seen again and a pap was attempted in the office but she was unable to tolerate this. She performed a vaginal swab on herself that found yeast. She used Monistat multiple times with improvement in her discharge. She then began to have pale pink discharge. At this time, she was seen by a Urogynecologist at [facility] and it sound like she had urodynamic testing. She describes a raw feeling and burning with her urine. She continues to have vaginal discharge which she notes has an ammonia-like smell. Her vaginal bleeding started again about a month ago. She briefly tried vaginal estrogen after seeing the Urogynecologist but she felt that the bleeding worsened as did the ammonia smell, so she stopped.   03/16/2020 Initial Diagnosis   Endometrial cancer ([place])   03/21/2020 Imaging   1. Abnormally thickened endometrial complex measuring up to 12 mm, presumably related to known history of endometrial  carcinoma. 2. Underlying fibroid uterus as detailed above. 3. Normal sonographic evaluation of the ovaries. No abnormal free fluid. Please note that the appendix appears to be potentially located in close proximity to the right ovary on this exam. As this patient is scheduled to undergo total hysterectomy with bilateral salpingo oophorectomy in the near future, close attention to this region at surgical intervention is recommended.     03/27/2020 Pathology Results   A. CUL DE SAC, ANTERIOR, BIOPSY:  -  Benign fibrovascular tissue with mesothelial hyperplasia  -  No carcinoma identified  -  See comment   B. MONS, RIGHT, BIOPSY:  -  Melanocytic nevus, intradermal type  -  See comment   C. LYMPH NODE, SENTINEL, RIGHT EXTERNAL ILIAC, BIOPSY:  -  No carcinoma identified in one lymph node (0/1)  -  See comment   D. PERITONEUM, LEFT SIDEWALL, BIOPSY:  -  Benign fibrovascular tissue with chronic inflammation  -  No carcinoma identified   E. LYMPH NODE, SENTINEL, LEFT EXTERNAL ILIAC, BIOPSY:  -  No carcinoma identified in one lymph node (0/1)  -  See comment   F. UTERUS, CERVIX, BILATERAL FALLOPIAN TUBES AND OVARIES:   Uterus:  -  Endometrioid carcinoma, FIGO grade 2  -  Leiomyomata (1.6 cm ; largest)  -  Carcinoma present in  lymphoid aggregate in serosal adhesions  (lymphovascular space invasion)  -  See oncology table and comment below   Cervix:  -  No carcinoma identified   Bilateral Ovaries:  -  No carcinoma identified   Bilateral Fallopian tubes:  -  Carcinoma within lumen of fallopian tube  (left)   G. PERIURETHRAL, BIOPSY:  -  No carcinoma identified    03/27/2020 Surgery   Robotic-assisted laparoscopic total hysterectomy with bilateral salpingoophorectomy, SLN biopsy   On EUA, very narrow introitus most due to tight hymenal ring. Small mobile uterus. Hyperpigmented 0.35m plaque on right mons, biopsied. Urethral overall normal in appearance, given hyperemic appearance  in clinic, biopsy taken. On intra-abdominal entry, some scarring noted on inferior aspect of the liver, anterior right diaphragm and left lobe of the liver. Omentum normal appearing. Mesentery of the small and large bowel as well as the bowel itself studded with 1-238minflammatory-appearing nodules. Appendix with same studding, otherwise normal in appearance. Uterus 6cm and normal appearing with the exception of inflammatory exudate on posterior aspect of the fundus. Bilateral adnexa normal appearing. Chocolate brown staining within most of the cul-de-sac. Inflammatory appearing nodules studding bilateral pelvic walls, anterior and posterior cul-de-sac c/w endometriosis. No adenopathy. No intra-abdominal or pelvic evidence of disease.   03/2020 Initial Biopsy   EMB - gr 1-2 EMCA   03/27/2020 Cancer Staging   Staging form: Corpus Uteri - Carcinoma and Carcinosarcoma, AJCC 8th Edition - Clinical stage from 03/27/2020: FIGO Stage IIIA (cT3a, cN0(sn), cM0) - Signed by GoHeath LarkMD on 04/24/2020   04/23/2020 Imaging   Status post hysterectomy and bilateral salpingo-oophorectomy.   Mild stranding along the anterior transverse mesocolon. No frank peritoneal nodularity or omental caking. Attention on follow-up is suggested.   No findings specific for recurrent or metastatic disease.   Bladder is mildly thick-walled although underdistended.     04/27/2020 - 08/17/2020 Chemotherapy   The patient had carboplatin and taxol for chemotherapy treatment.     10/04/2020 Imaging   Resolution of omental soft tissue stranding since prior study. No evidence of recurrent or metastatic carcinoma within the abdomen or pelvis     REVIEW OF SYSTEMS:   Constitutional: Denies fevers, chills or abnormal weight loss Eyes: Denies blurriness of vision Ears, nose, mouth, throat, and face: Denies mucositis or sore throat Respiratory: Denies cough, dyspnea or wheezes Cardiovascular: Denies palpitation, chest discomfort or  lower extremity swelling Gastrointestinal:  Denies nausea, heartburn or change in bowel habits Skin: Denies abnormal skin rashes Lymphatics: Denies new lymphadenopathy or easy bruising Neurological:Denies numbness, tingling or new weaknesses Behavioral/Psych: Mood is stable, no new changes  All other systems were reviewed with the patient and are negative.  I have reviewed the past medical history, past surgical history, social history and family history with the patient and they are unchanged from previous note.  ALLERGIES:  is allergic to ceftin [cefuroxime], crestor [rosuvastatin calcium], and latex.  MEDICATIONS:  Current Outpatient Medications  Medication Sig Dispense Refill  . Cholecalciferol (VITAMIN D) 125 MCG (5000 UT) CAPS Take 5,000 Units by mouth daily.    . cyclobenzaprine (FLEXERIL) 10 MG tablet Take 1 tablet (10 mg total) by mouth 3 (three) times daily as needed for muscle spasms. 30 tablet 0  . famotidine (PEPCID) 20 MG tablet Take 20 mg by mouth 2 (two) times daily as needed for heartburn or indigestion.    . pantoprazole (PROTONIX) 40 MG tablet Take 40 mg by mouth daily as needed (acid reduction).     .Marland Kitchen  polyethylene glycol (MIRALAX / GLYCOLAX) 17 g packet Take 17 g by mouth daily as needed.    . traMADol (ULTRAM) 50 MG tablet Take 1 tablet (50 mg total) by mouth every 6 (six) hours as needed. 30 tablet 0   No current facility-administered medications for this visit.    PHYSICAL EXAMINATION: ECOG PERFORMANCE STATUS: 1 - Symptomatic but completely ambulatory  Vitals:   10/05/20 0838  BP: (!) 106/56  Pulse: 77  Resp: 18  Temp: (!) 97.5 F (36.4 C)  SpO2: 100%   Filed Weights   10/05/20 0838  Weight: 148 lb 9.6 oz (67.4 kg)    GENERAL:alert, no distress and comfortable Musculoskeletal:no cyanosis of digits and no clubbing  NEURO: alert & oriented x 3 with fluent speech, no focal motor/sensory deficits  LABORATORY DATA:  I have reviewed the data as listed     Component Value Date/Time   NA 141 10/03/2020 1554   K 3.7 10/03/2020 1554   CL 106 10/03/2020 1554   CO2 24 10/03/2020 1554   GLUCOSE 108 (H) 10/03/2020 1554   BUN 29 (H) 10/03/2020 1554   CREATININE 0.87 10/03/2020 1554   CREATININE 0.89 12/04/2015 0901   CALCIUM 9.1 10/03/2020 1554   PROT 7.3 10/03/2020 1554   ALBUMIN 4.4 10/03/2020 1554   AST 19 10/03/2020 1554   ALT 12 10/03/2020 1554   ALKPHOS 67 10/03/2020 1554   BILITOT 0.3 10/03/2020 1554   GFRNONAA >60 10/03/2020 1554   GFRAA >60 05/18/2020 0941    No results found for: SPEP, UPEP  Lab Results  Component Value Date   WBC 8.2 10/03/2020   NEUTROABS 5.2 10/03/2020   HGB 10.5 (L) 10/03/2020   HCT 32.0 (L) 10/03/2020   MCV 96.4 10/03/2020   PLT 195 10/03/2020      Chemistry      Component Value Date/Time   NA 141 10/03/2020 1554   K 3.7 10/03/2020 1554   CL 106 10/03/2020 1554   CO2 24 10/03/2020 1554   BUN 29 (H) 10/03/2020 1554   CREATININE 0.87 10/03/2020 1554   CREATININE 0.89 12/04/2015 0901      Component Value Date/Time   CALCIUM 9.1 10/03/2020 1554   ALKPHOS 67 10/03/2020 1554   AST 19 10/03/2020 1554   ALT 12 10/03/2020 1554   BILITOT 0.3 10/03/2020 1554       RADIOGRAPHIC STUDIES: I have reviewed multiple CT imaging with the patient and her sister I have personally reviewed the radiological images as listed and agreed with the findings in the report. CT ABDOMEN PELVIS W CONTRAST  Result Date: 10/04/2020 CLINICAL DATA:  Follow-up endometrial carcinoma. EXAM: CT ABDOMEN AND PELVIS WITH CONTRAST TECHNIQUE: Multidetector CT imaging of the abdomen and pelvis was performed using the standard protocol following bolus administration of intravenous contrast. CONTRAST:  178mL OMNIPAQUE IOHEXOL 300 MG/ML  SOLN COMPARISON:  04/23/2020 FINDINGS: Lower Chest: No acute findings. Hepatobiliary: No hepatic masses identified. Gallbladder is unremarkable. No evidence of biliary ductal dilatation. Pancreas:  No  mass or inflammatory changes. Spleen: Within normal limits in size and appearance. Adrenals/Urinary Tract: No masses identified. No evidence of ureteral calculi or hydronephrosis. Stomach/Bowel: No evidence of obstruction, inflammatory process or abnormal fluid collections. Normal appendix visualized. Vascular/Lymphatic: No pathologically enlarged lymph nodes. No abdominal aortic aneurysm. Reproductive: Prior hysterectomy noted. Adnexal regions are unremarkable in appearance. Other: Previously seen misty soft tissue stranding in the omental fat has resolved since previous study. No evidence of peritoneal thickened or nodularity. No evidence  of ascites. Musculoskeletal:  No suspicious bone lesions identified. IMPRESSION: Resolution of omental soft tissue stranding since prior study. No evidence of recurrent or metastatic carcinoma within the abdomen or pelvis. Electronically Signed   By: Marlaine Hind M.D.   On: 10/04/2020 15:21

## 2020-10-05 NOTE — Assessment & Plan Note (Signed)
She has significant physical deconditioning I will refer her back to physical therapy and rehab At this point in time, she is not ready to go back to work for another month

## 2020-10-05 NOTE — Assessment & Plan Note (Signed)
We spent half the time discussing about her bladder symptoms The patient is having urethral discharge and discomfort when she urinate I recommend urinalysis and urine culture for evaluation The patient wants to show me the collection of her pads with urethral discharge and is really interested to meet with a urologist for assessment and evaluation She is fearful, with abnormal bladder thickening seen on original CT imaging, that she might have concurrent bladder cancer or invasion of her uterine cancer to the bladder She is interested to consider having a cystoscopy done I recommend evaluation by her GYN oncologist first early next week I also recommend physical therapy to help with pelvic floor dysfunction that could improve her bladder control long-term

## 2020-10-05 NOTE — Assessment & Plan Note (Signed)
I reviewed original pathology with the patient and her sister The patient started reading about different types of uterine cancer I told her that the patient had endometrioid subtype which is a common type of pathology She is concerned with her postsurgical CT imaging which show significant abnormalities including bladder wall thickening and others and is wondering whether she could still have disease within her abdomen I told her the limitation of abnormal findings after surgery I told the patient she has complete resection and we discussed again the role of adjuvant chemotherapy and radiation treatment Her latest scan confirmed complete response to therapy without any residual disease I continue to reassure the patient that at this point in time, she is considered cancer free I have scheduled close follow-up with her GYN oncologist early next week for further evaluation and assessment

## 2020-10-05 NOTE — Assessment & Plan Note (Signed)
The most likely cause of her persistent anemia is due to residual bone marrow suppressive effect from her recent chemotherapy However, additional work-up show she has borderline low vitamin B12 level I recommend oral vitamin B12 supplement

## 2020-10-05 NOTE — Telephone Encounter (Signed)
[facility] called back and said they do need a new referral since the last one was closed.  Faxed it to [phone].

## 2020-10-05 NOTE — Telephone Encounter (Signed)
Left a message with [facility] regarding referral.  Requested a return call.

## 2020-10-07 LAB — URINE CULTURE: Culture: 30000 — AB

## 2020-10-08 ENCOUNTER — Telehealth: Payer: Self-pay

## 2020-10-08 ENCOUNTER — Other Ambulatory Visit: Payer: Self-pay | Admitting: Hematology and Oncology

## 2020-10-08 ENCOUNTER — Encounter: Payer: Self-pay | Admitting: Gynecologic Oncology

## 2020-10-08 ENCOUNTER — Other Ambulatory Visit: Payer: Self-pay

## 2020-10-08 ENCOUNTER — Inpatient Hospital Stay: Payer: 59 | Admitting: Gynecologic Oncology

## 2020-10-08 VITALS — BP 117/77 | HR 85 | Temp 98.4°F | Resp 18 | Wt 147.4 lb

## 2020-10-08 DIAGNOSIS — C541 Malignant neoplasm of endometrium: Secondary | ICD-10-CM

## 2020-10-08 DIAGNOSIS — R300 Dysuria: Secondary | ICD-10-CM

## 2020-10-08 DIAGNOSIS — Z90710 Acquired absence of both cervix and uterus: Secondary | ICD-10-CM | POA: Diagnosis not present

## 2020-10-08 DIAGNOSIS — M8580 Other specified disorders of bone density and structure, unspecified site: Secondary | ICD-10-CM | POA: Diagnosis not present

## 2020-10-08 DIAGNOSIS — G620 Drug-induced polyneuropathy: Secondary | ICD-10-CM | POA: Diagnosis not present

## 2020-10-08 DIAGNOSIS — E785 Hyperlipidemia, unspecified: Secondary | ICD-10-CM | POA: Diagnosis not present

## 2020-10-08 DIAGNOSIS — K219 Gastro-esophageal reflux disease without esophagitis: Secondary | ICD-10-CM | POA: Diagnosis not present

## 2020-10-08 DIAGNOSIS — E7800 Pure hypercholesterolemia, unspecified: Secondary | ICD-10-CM | POA: Diagnosis not present

## 2020-10-08 DIAGNOSIS — Z87440 Personal history of urinary (tract) infections: Secondary | ICD-10-CM | POA: Diagnosis not present

## 2020-10-08 DIAGNOSIS — Z79899 Other long term (current) drug therapy: Secondary | ICD-10-CM | POA: Diagnosis not present

## 2020-10-08 MED ORDER — FLUCONAZOLE 150 MG PO TABS: 150.0000 mg | ORAL_TABLET | Freq: Every day | ORAL | 0 refills | Status: DC

## 2020-10-08 MED ORDER — SULFAMETHOXAZOLE-TRIMETHOPRIM 800-160 MG PO TABS: 1.0000 | ORAL_TABLET | Freq: Two times a day (BID) | ORAL | 0 refills | Status: DC

## 2020-10-08 MED FILL — SULFAMETHOXAZOLE-TMP DS TAB: 800-160 | 3 days supply | Qty: 6 | Fill #0

## 2020-10-08 MED FILL — FLUCONAZOLE 150 MG TABS: 150 | 1 days supply | Qty: 1 | Fill #0

## 2020-10-08 NOTE — Telephone Encounter (Signed)
Called and given below message. She verbalized understanding. She uses [facility]. Requesting Diflucan Rx also with antibiotic.  Message given to Dr. Alvy Bimler.

## 2020-10-08 NOTE — Progress Notes (Signed)
Gynecologic Oncology Return Clinic Visit  10/08/20  Reason for Visit: follow-up after adjuvant treatment, urethral symptoms  Treatment History: Oncology History Overview Note  IHC MMR normal Endometrioid MSI-stable   Endometrial cancer ([place])  02/28/2020 Initial Diagnosis   The patient presented for an exam on 6/22.  At that time she endorsed episodes of postmenopausal bleeding starting in 2019-08-24 after her mother's death.  She had not had a menses in a number of years.  Patient's exam was limited by her intolerance in the position of the cervix.  Pap test was performed on 6/30 showing atypical glandular cells of undetermined significance. 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 She describes about a year long history previously of clear vaginal discharge. She saw her PCP for this complaint and given what was felt to be urethral swelling, she was treated for a UTI (lab testing did not show an infection). She has not been sexually active since her 49s. She endorses increased urinary frequency. After a year, the clear discharge became yellow (and at one point looked green). The patient was seen again and a pap was attempted in the office but she was unable to tolerate this. She performed a vaginal swab on herself that found yeast. She used Monistat multiple times with improvement in her discharge. She then began to have pale pink discharge. At this time, she was seen by a Urogynecologist at [facility] and it sound like she had urodynamic testing. She describes a raw feeling and burning with her urine. She continues to have vaginal discharge which she notes has an ammonia-like smell. Her vaginal bleeding started again about a month ago. She briefly tried vaginal estrogen after seeing the Urogynecologist but she felt that the bleeding worsened as did the ammonia smell, so she stopped.   03/16/2020 Initial Diagnosis   Endometrial cancer ([place])    03/21/2020 Imaging   1. Abnormally thickened endometrial complex measuring up to 12 mm, presumably related to known history of endometrial carcinoma. 2. Underlying fibroid uterus as detailed above. 3. Normal sonographic evaluation of the ovaries. No abnormal free fluid. Please note that the appendix appears to be potentially located in close proximity to the right ovary on this exam. As this patient is scheduled to undergo total hysterectomy with bilateral salpingo oophorectomy in the near future, close attention to this region at surgical intervention is recommended.     03/27/2020 Pathology Results   A. CUL DE SAC, ANTERIOR, BIOPSY:  -  Benign fibrovascular tissue with mesothelial hyperplasia  -  No carcinoma identified  -  See comment   B. MONS, RIGHT, BIOPSY:  -  Melanocytic nevus, intradermal type  -  See comment   C. LYMPH NODE, SENTINEL, RIGHT EXTERNAL ILIAC, BIOPSY:  -  No carcinoma identified in one lymph node (0/1)  -  See comment   D. PERITONEUM, LEFT SIDEWALL, BIOPSY:  -  Benign fibrovascular tissue with chronic inflammation  -  No carcinoma identified   E. LYMPH NODE, SENTINEL, LEFT EXTERNAL ILIAC, BIOPSY:  -  No carcinoma identified in one lymph node (0/1)  -  See comment   F. UTERUS, CERVIX, BILATERAL FALLOPIAN TUBES AND OVARIES:   Uterus:  -  Endometrioid carcinoma, FIGO grade 2  -  Leiomyomata (1.6 cm ; largest)  -  Carcinoma present in lymphoid aggregate in serosal adhesions  (lymphovascular space invasion)  -  See oncology table and comment below   Cervix:  -  No carcinoma  identified   Bilateral Ovaries:  -  No carcinoma identified   Bilateral Fallopian tubes:  -  Carcinoma within lumen of fallopian tube  (left)   G. PERIURETHRAL, BIOPSY:  -  No carcinoma identified    03/27/2020 Surgery   Robotic-assisted laparoscopic total hysterectomy with bilateral salpingoophorectomy, SLN biopsy   On EUA, very narrow introitus most due to tight hymenal ring.  Small mobile uterus. Hyperpigmented 0.71m plaque on right mons, biopsied. Urethral overall normal in appearance, given hyperemic appearance in clinic, biopsy taken. On intra-abdominal entry, some scarring noted on inferior aspect of the liver, anterior right diaphragm and left lobe of the liver. Omentum normal appearing. Mesentery of the small and large bowel as well as the bowel itself studded with 1-282minflammatory-appearing nodules. Appendix with same studding, otherwise normal in appearance. Uterus 6cm and normal appearing with the exception of inflammatory exudate on posterior aspect of the fundus. Bilateral adnexa normal appearing. Chocolate brown staining within most of the cul-de-sac. Inflammatory appearing nodules studding bilateral pelvic walls, anterior and posterior cul-de-sac c/w endometriosis. No adenopathy. No intra-abdominal or pelvic evidence of disease.   03/2020 Initial Biopsy   EMB - gr 1-2 EMCA   03/27/2020 Cancer Staging   Staging form: Corpus Uteri - Carcinoma and Carcinosarcoma, AJCC 8th Edition - Clinical stage from 03/27/2020: FIGO Stage IIIA (cT3a, cN0(sn), cM0) - Signed by GoHeath LarkMD on 04/24/2020   04/23/2020 Imaging   Status post hysterectomy and bilateral salpingo-oophorectomy.   Mild stranding along the anterior transverse mesocolon. No frank peritoneal nodularity or omental caking. Attention on follow-up is suggested.   No findings specific for recurrent or metastatic disease.   Bladder is mildly thick-walled although underdistended.     04/27/2020 - 08/17/2020 Chemotherapy   The patient had carboplatin and taxol for chemotherapy treatment.     07/09/2020 - 07/26/2020 Radiation Therapy   VBT: 30 Gy, 5 fractions, HDR   10/04/2020 Imaging   Resolution of omental soft tissue stranding since prior study. No evidence of recurrent or metastatic carcinoma within the abdomen or pelvis     Interval History: [name] for follow-up secondary to  urethral symptoms.  After her third cycle of chemotherapy, the sharp pain that she was having in her bladder and related to her urethra resolved.  About 2 weeks ago she notes feeling that "something was not right".  She notes this feeling was present constantly.  About 1 week ago, she began feeling swelling of her urethra.  Her discharge is now present and she describes there being yellow discharge both at the front of the pad and also the back.  The quantity of discharge is different each day.  She was having normal bowel function although more recently has had some diarrhea.  She endorses a good appetite without any nausea or emesis.  She recently saw Dr. GoAlvy Bimler Her posttreatment CT scan shows no evidence of disease.  Given her urethral symptoms, urinalysis and urine culture was sent which shows Staphylococcus Simulans UTI.  Prescription for antibiotics and Diflucan was sent to her pharmacy this morning, patient has yet to start antibiotics.  Past Medical/Surgical History: Past Medical History:  Diagnosis Date  . Abnormal EKG 07/16/2015  . Anxiety and depression    pt denies 7/16/  . Bruises easily   . Chest pain   . Chest pain with moderate risk of acute coronary syndrome   . Chest tightness   . Difficult intubation    throat feels small pt has  acid reflux ? scars due to acid   . Elevated cholesterol   . Endometrial cancer ([place])   . Family history of premature CAD 08-03-2015   Father had MI 36's, died at 42 of an MI   . Fatigue   . Fibroid   . Fx sacrum/coccyx-closed ([place])   . Gastritis   . GERD (gastroesophageal reflux disease)   . Hyperlipidemia 03-Aug-2015  . Osteopenia   . Pigmented skin lesions   . Pre-diabetes   . Sinus problem   . SOB (shortness of breath) on exertion   . Urethra disorder    currently swollen per pt   . Vitamin D deficiency   . Weight gain     Past Surgical History:  Procedure Laterality Date  . ADENOIDECTOMY    . COLONOSCOPY    . ROBOTIC ASSISTED  TOTAL HYSTERECTOMY WITH BILATERAL SALPINGO OOPHERECTOMY Bilateral 03/27/2020   Procedure: XI ROBOTIC ASSISTED TOTAL HYSTERECTOMY WITH BILATERAL SALPINGO OOPHORECTOMY;  Surgeon: [name], MD;  Location: [facility];  Service: Gynecology;  Laterality: Bilateral;  . SENTINEL NODE BIOPSY N/A 03/27/2020   Procedure: SENTINEL NODE BIOPSY, URETHRAL BIOPSY AND VAGINAL CULTURE;  Surgeon: [name], MD;  Location: [facility];  Service: Gynecology;  Laterality: N/A;  . SMALL BOWEL ENTEROSCOPY    . TONSILLECTOMY      Family History  Problem Relation Age of Onset  . Atrial fibrillation Mother   . Hypertension Mother   . Stroke Mother   . Arthritis Mother   . Atrial fibrillation Father   . Heart attack Father 60  . Hypertension Father   . COPD Father   . Diabetes Father   . Colon cancer Paternal Grandmother   . Breast cancer Neg Hx     Social History   Socioeconomic History  . Marital status: Single    Spouse name: Not on file  . Number of children: 0  . Years of education: 29  . Highest education level: Not on file  Occupational History  . Occupation: Therapist, sports  Tobacco Use  . Smoking status: Never Smoker  . Smokeless tobacco: Never Used  Vaping Use  . Vaping Use: Never used  Substance and Sexual Activity  . Alcohol use: Never  . Drug use: No  . Sexual activity: Not Currently  Other Topics Concern  . Not on file  Social History Narrative  . Not on file   Social Determinants of Health   Financial Resource Strain: Not on file  Food Insecurity: Not on file  Transportation Needs: Not on file  Physical Activity: Not on file  Stress: Not on file  Social Connections: Not on file    Current Medications:  Current Outpatient Medications:  .  Cholecalciferol (VITAMIN D) 125 MCG (5000 UT) CAPS, Take 5,000 Units by mouth daily., Disp: , Rfl:  .  cyclobenzaprine (FLEXERIL) 10 MG tablet, Take 1 tablet (10 mg total) by mouth 3 (three) times daily as needed for muscle spasms., Disp:  30 tablet, Rfl: 0 .  famotidine (PEPCID) 20 MG tablet, Take 20 mg by mouth 2 (two) times daily as needed for heartburn or indigestion., Disp: , Rfl:  .  pantoprazole (PROTONIX) 40 MG tablet, Take 40 mg by mouth daily as needed (acid reduction). , Disp: , Rfl:  .  polyethylene glycol (MIRALAX / GLYCOLAX) 17 g packet, Take 17 g by mouth daily as needed., Disp: , Rfl:  .  traMADol (ULTRAM) 50 MG tablet, Take 1 tablet (50 mg total) by mouth  every 6 (six) hours as needed., Disp: 30 tablet, Rfl: 0 .  fluconazole (DIFLUCAN) 150 MG tablet, Take 1 tablet (150 mg total) by mouth daily., Disp: 1 tablet, Rfl: 0 .  sulfamethoxazole-trimethoprim (BACTRIM DS) 800-160 MG tablet, Take 1 tablet by mouth 2 (two) times daily., Disp: 6 tablet, Rfl: 0  Review of Systems: Pertinent positives as per HPI Denies appetite changes, fevers, chills, unexplained weight changes. Denies hearing loss, neck lumps or masses, mouth sores, ringing in ears or voice changes. Denies cough or wheezing.  Denies shortness of breath. Denies chest pain or palpitations. Denies leg swelling. Denies abdominal distention, pain, blood in stools, constipation, diarrhea, nausea, vomiting, or early satiety. Denies pain with intercourse, frequency, hematuria. Denies hot flashes, pelvic pain, vaginal bleeding.   Denies joint pain, back pain or muscle pain/cramps. Denies itching, rash, or wounds. Denies dizziness, headaches, numbness or seizures. Denies swollen lymph nodes or glands, denies easy bruising or bleeding. Denies anxiety, depression, confusion, or decreased concentration.  Physical Exam: BP 117/77 (BP Location: Right Arm, Patient Position: Sitting)   Pulse 85   Temp 98.4 F (36.9 C) (Tympanic)   Resp 18   Wt 147 lb 6.4 oz (66.9 kg)   SpO2 100%   BMI 25.30 kg/m  General: Alert, oriented, no acute distress. HEENT: Normocephalic, atraumatic, sclera anicteric.  GU: Normal appearing external genitalia without erythema,  excoriation, or lesions.  Speculum exam reveals moderately atrophic vaginal mucosa, especially at vaginal apex. No lesions noted. No discharge or bleeding appreciated.  Bimanual exam reveals cuff intact, no nodularity. No pain with palpation along the anterior vagina.  Rectovaginal exam confirms these findings.  Laboratory & Radiologic Studies: Urine culture: 30,000 - staphylococcus simulans  CT A/P on 1/27: IMPRESSION: Resolution of omental soft tissue stranding since prior study. No evidence of recurrent or metastatic carcinoma within the abdomen or pelvis.  Assessment & Plan: Emily [name] is a 60 y.o. woman with Stage IIIAgrade 2 endometrioid endometrial adenocarcinomawho presents for urethral symptoms after finishing adjuvant treatment.  Valaree's exam [name] is very reassuring and without evidence of any cancer.  She tolerated pelvic exam quite well [name] which she has the last several times.  I do not see any evidence of bleeding or discharge on exam.  I do not see evidence of urethral swelling or erythema.  She has some atrophy as well as radiation changes at the top of the vagina but all within what would be expected after treatment.  She was prescribed Bactrim and Diflucan this morning by Dr. Alvy Bimler given her urine study results.  We discussed the plan to take antibiotics, and I have asked her to call with symptoms after. If her symptoms resolve, I would recommend we start some vaginal estrogen and pause any additional work-up. If she continues to have symptoms, then we will repeat urine culture (assure UTI adequately treated) and can refer to urology for additional work-up.  Patient voices on multiple occasions [name] significant concern that she has either bladder cancer or uterine cancer that spread to her bladder.  Based on all of my exams, findings at the time of surgery, and CT scan results, I tried to reassure Emily [name] that there is nothing pointing to either of these 2  possibilities.  We discussed CT findings immediately after surgery showing some thickening of the bladder wall.  This would be a normal finding after surgery.  This has completely resolved as of her last CT scan.  I do think that she would benefit from  being on vaginal estrogen.  We discussed vitamin E suppositories, which she has used in the past.  I favor vaginal estrogen given data that shows decreased risk of urinary tract infections in postmenopausal women.  We will plan to reevaluate this once she has finished treatment for her urinary tract infection.  40 minutes of total time was spent for this patient encounter, including preparation, face-to-face counseling with the patient and coordination of care, and documentation of the encounter.  Jeral Pinch, MD  Division of Gynecologic Oncology  Department of Obstetrics and Gynecology  [facility]

## 2020-10-08 NOTE — Telephone Encounter (Signed)
-----   Message from [name], MD sent at 10/08/2020  8:01 AM EST ----- Regarding: UTI Her tests confirm UTI I will send antibiotics Which pharmacy?

## 2020-10-08 NOTE — Patient Instructions (Signed)
Great to see you today! Everything is normal on your exam. I will see you in June unless something comes up sooner.  Please call Dr. Alvy Bimler or my office after you finish antibiotics to let us know how you are doing.

## 2020-10-10 ENCOUNTER — Ambulatory Visit: Payer: 59 | Attending: Hematology and Oncology | Admitting: Physical Therapy

## 2020-10-10 ENCOUNTER — Other Ambulatory Visit: Payer: Self-pay

## 2020-10-10 DIAGNOSIS — M6281 Muscle weakness (generalized): Secondary | ICD-10-CM | POA: Insufficient documentation

## 2020-10-10 DIAGNOSIS — R209 Unspecified disturbances of skin sensation: Secondary | ICD-10-CM | POA: Diagnosis not present

## 2020-10-10 DIAGNOSIS — L599 Disorder of the skin and subcutaneous tissue related to radiation, unspecified: Secondary | ICD-10-CM | POA: Insufficient documentation

## 2020-10-10 DIAGNOSIS — Z483 Aftercare following surgery for neoplasm: Secondary | ICD-10-CM | POA: Diagnosis not present

## 2020-10-10 NOTE — Therapy (Signed)
[facility] [address] [place], [place], [address] Phone: [phone]   Fax:  [phone]  Physical Therapy Evaluation  Patient Details  Name: Emily [name] MRN: [id number] Date of Birth: 09-21-1960 Referring Provider (PT): Dr. Bertis Ruddy   Encounter Date: 10/10/2020   PT End of Session - 10/10/20 1914    Visit Number 1    Number of Visits 17    Date for PT Re-Evaluation 12/10/20    PT Start Time 1500    PT Stop Time 1550    PT Time Calculation (min) 50 min    Activity Tolerance Patient tolerated treatment well    Behavior During Therapy [facility] for tasks assessed/performed           Past Medical History:  Diagnosis Date  . Abnormal EKG 07-19-2015  . Anxiety and depression    pt denies 7/16/  . Bruises easily   . Chest pain   . Chest pain with moderate risk of acute coronary syndrome   . Chest tightness   . Difficult intubation    throat feels small pt has acid reflux ? scars due to acid   . Elevated cholesterol   . Endometrial cancer (HCC)   . Family history of premature CAD 07-19-2015   Father had MI 25's, died at 26 of an MI   . Fatigue   . Fibroid   . Fx sacrum/coccyx-closed (HCC)   . Gastritis   . GERD (gastroesophageal reflux disease)   . Hyperlipidemia 07-19-2015  . Osteopenia   . Pigmented skin lesions   . Pre-diabetes   . Sinus problem   . SOB (shortness of breath) on exertion   . Urethra disorder    currently swollen per pt   . Vitamin D deficiency   . Weight gain     Past Surgical History:  Procedure Laterality Date  . ADENOIDECTOMY    . COLONOSCOPY    . ROBOTIC ASSISTED TOTAL HYSTERECTOMY WITH BILATERAL SALPINGO OOPHERECTOMY Bilateral 03/27/2020   Procedure: XI ROBOTIC ASSISTED TOTAL HYSTERECTOMY WITH BILATERAL SALPINGO OOPHORECTOMY;  Surgeon: Carver Fila, MD;  Location: [facility];  Service: Gynecology;  Laterality: Bilateral;  . SENTINEL NODE BIOPSY N/A 03/27/2020   Procedure: SENTINEL  NODE BIOPSY, URETHRAL BIOPSY AND VAGINAL CULTURE;  Surgeon: Carver Fila, MD;  Location: [facility];  Service: Gynecology;  Laterality: N/A;  . SMALL BOWEL ENTEROSCOPY    . TONSILLECTOMY      There were no vitals filed for this visit.    Subjective Assessment - 10/10/20 1518    Subjective " the main thing is that I'm deconditioned"  "I'm tired"  Pt is interested in seeing our pelvic floor specialist  She reports an episode a while ago during chemo of extreme pain in her legs after a trial of exericse, so she has not been doing much exercise.  She needs to get stronger to return to work    Pertinent History 03/16/2020 diagnosis of endometrial cancer with laparoscopic total hysterectomy with bilateral salpingoophorectomy, SLN biopsy on 03/27/2020. Pt with carboplatin and taxol chemo 8/20-12/06/2020 and radiation 11/1-11/18/2021    Patient Stated Goals To go back to work full time    Currently in Pain? No/denies              [facility] PT Assessment - 10/10/20 0001      Assessment   Medical Diagnosis endometrial cancer    Referring Provider (PT) Dr. Bertis Ruddy    Onset Date/Surgical Date 03/16/20  Hand Dominance Right      Precautions   Precautions Other (comment)    Precaution Comments recent chemo and radiaion      Restrictions   Weight Bearing Restrictions No      Balance Screen   Has the patient fallen in the past 6 months No    Has the patient had a decrease in activity level because of a fear of falling?  No    Is the patient reluctant to leave their home because of a fear of falling?  No      Home Environment   Living Environment Private residence    Living Arrangements Alone    Available Help at Discharge Available PRN/intermittently      Prior Function   Level of Independence Independent    Vocation Full time employment   works 12 hours a day at [employer]   Vocation Requirements active    Leisure has not been exercising      Cognition   Overall  Cognitive Status Within Functional Limits for tasks assessed      Observation/Other Assessments   Observations Pt walks into clinic without device or deficit.      Observation/Other Assessments-Edema    Edema --   pt reports swelling in lower abdomen above the pubic bone. did not measure at this time     Sensation   Additional Comments pt reports she had some intermittent numbness in her legs and fingers      Coordination   Gross Motor Movements are Fluid and Coordinated --   pt reports her legs start shaking at times, she thinks she is just weak     Functional Tests   Functional tests Sit to Stand      Sit to Stand   Comments 13 reps in 30 seconds with some shakiness at the end of trial      Posture/Postural Control   Posture/Postural Control Postural limitations    Postural Limitations Rounded Shoulders;Forward head;Decreased lumbar lordosis;Decreased thoracic kyphosis      ROM / Strength   AROM / PROM / Strength AROM;Strength      AROM   Overall AROM  Deficits    Overall AROM Comments very tight right hanstrings ( only about 30 SLR in supine and about 45 degrees on left leg SLR in supine with tightness in both hips internal rotation      Strength   Overall Strength Deficits    Overall Strength Comments pt feels generally weak, she has some odd shaking of legs with effort    Strength Assessment Site Hand    Right/Left hand Right;Left    Right Hand Grip (lbs) 52/50/45   norm for 60 yo female is 57.3   Left Hand Grip (lbs) 51/49/42      Flexibility   Soft Tissue Assessment /Muscle Length yes    Hamstrings very tight hamstrings, especially in the right      Ambulation/Gait   Ambulation/Gait Yes    Ambulation/Gait Assistance 7: Independent    Assistive device None    Gait Pattern Within Functional Limits    Ambulation Surface Level;Indoor    Gait velocity 1.44 m/sec      Balance   Balance Assessed Yes      Standardized Balance Assessment   Standardized Balance  Assessment Timed Up and Go Test;Five Times Sit to Stand    Five times sit to stand comments  11.20 sec      Timed Up and  Go Test   Normal TUG (seconds) 5.64      High Level Balance   High Level Balance Comments pt is able to stand side by side, semi tandem and tandem for 10 each, unilateral stance for 6 sec                      Objective measurements completed on examination: See above findings.       [facility] PT Treatment/Exercise - 10/10/20 0001      Self-Care   Self-Care Other Self-Care Comments    Other Self-Care Comments  pt reports she is not having swelling in her legs and was educated about what to watch out for. She says she has having some mild swelling in lower abdomen above pubis so was give and chip pack to wear inside compressive underwear to try to see if that will help reduce it.  If she continues to have this swelling after a few months, she may benefit from instruction her for MLD and further compression .  Pt also given information about [employer] and yoga classes                    PT Short Term Goals - 10/10/20 1930      PT SHORT TERM GOAL #1   Title Pt will be independent a basic home exercise program for stretching and strengthening    Time 4    Period Weeks    Status New      PT SHORT TERM GOAL #2   Title Pt reports her feelings of tiredness are improved by 50%    Time 4    Period Weeks    Status New      PT SHORT TERM GOAL #3   Title Pt will have about 45 degrees of right leg hamstring stretch SLR in supine without pain    Time 4    Period Weeks    Status New             PT Long Term Goals - 10/10/20 1931      PT LONG TERM GOAL #1   Title Pt will be indpendent in a home exercise program that she can continue in the community at discharge    Time 8    Period Weeks    Status New      PT LONG TERM GOAL #2   Title Pt reports her fatigue level has improved to the point that she can return to her work  schedule    Time 8    Period Weeks    Status New      PT LONG TERM GOAL #3   Title Pt will have about 60 degrees of bilateral hamstring stretch SLR in supine with no discomfort    Time 8    Period Weeks    Status New                  Plan - 10/10/20 1914    Clinical Impression Statement Pt comes to PT with deconditioning and CIPN after treatment with surgery, chemo and radiation for endometrial cancer. She did very well on functional and balance screens except for mild decrease in bilateral grip strength compared to norms. She has complaints of shakiness and feelings of leg weakness thought she test within functional limits to isometric testing.  She does have significant hamstring tightness more on right leg than left leg. She is under treatment for  a UTI and would like to have treatment for pelvic floor impairment symptoms and may  benefit from aquatic therapy for fitness, strengthening and stretching.  There is not an opening for pelvic floor evaluation til mid March so pt will begin with PT at our clinic for stretching and strengthening and we will transition her care to the [facility] clinic and/or aquatics when that is available.    Personal Factors and Comorbidities Comorbidity 3+    Comorbidities surgery, chemo, radiation    Examination-Activity Limitations Lift;Squat;Stairs;Other    Examination-Participation Restrictions Occupation    Stability/Clinical Decision Making Stable/Uncomplicated    Clinical Decision Making Low    Rehab Potential Excellent    PT Frequency 2x / week    PT Duration 8 weeks    PT Treatment/Interventions ADLs/Self Care Home Management;Electrical Stimulation;Aquatic Therapy;Therapeutic activities;Therapeutic exercise;Balance training;Neuromuscular re-education;Patient/family education;Manual techniques;Manual lymph drainage;Compression bandaging;Passive range of motion;Energy conservation;Taping    PT Next Visit Plan begin with gentle  stretching  prgram, especially for hips and hamstrings, begin with core activation exerces. Progress to LE strengthening and aerobic component working to RPE of 5/10 per session    Consulted and Agree with Plan of Care Patient           Patient will benefit from skilled therapeutic intervention in order to improve the following deficits and impairments:  Decreased endurance,Decreased mobility,Hypomobility,Increased muscle spasms,Increased edema,Decreased range of motion,Decreased knowledge of precautions,Increased fascial restricitons,Impaired flexibility,Postural dysfunction,Pain  Visit Diagnosis: Aftercare following surgery for neoplasm - Plan: PT plan of care cert/re-cert  Disorder of the skin and subcutaneous tissue related to radiation, unspecified - Plan: PT plan of care cert/re-cert  Unspecified disturbances of skin sensation - Plan: PT plan of care cert/re-cert  Muscle weakness (generalized) - Plan: PT plan of care cert/re-cert     Problem List Patient Active Problem List   Diagnosis Date Noted  . Dysuria 10/05/2020  . Bilateral leg pain 09/13/2020  . Deficiency anemia 08/17/2020  . Physical deconditioning 06/13/2020  . Anemia due to antineoplastic chemotherapy 06/13/2020  . Restless leg 05/18/2020  . 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 Pelvic pain 04/17/2020  . Vaginal pain 04/17/2020  . Endometrial cancer ([place]) 03/16/2020  . Prediabetes 08/10/2019  . Hyperlipidemia 07/16/2015  . Abnormal EKG 07/16/2015  . Family history of premature CAD 07/16/2015  . Elevated cholesterol   . Fx sacrum/coccyx-closed ([place])   . Osteopenia   . Chest tightness   . Chest pain with moderate risk of acute coronary syndrome   . Weight gain   . SOB (shortness of breath) on exertion   . Anxiety and depression   . Pigmented skin lesions   . Fibroid   . Vitamin D deficiency    Donato Heinz. Hilaire Shark PT  Norwood Levo 10/10/2020, 7:36 PM  [place], [place], [address] Phone: [phone]   Fax:  [phone]  Name: Emily [name] MRN: [id number] Date of Birth: Jul 14, 1961

## 2020-10-11 ENCOUNTER — Telehealth: Payer: Self-pay | Admitting: Physical Therapy

## 2020-10-11 NOTE — Telephone Encounter (Signed)
Spoke with pt.  She would like to hold off on PT visits until she can get her pelvic floor evaluation in mid March.  She says she does not really feel well enough right now to progress with conditioning exercises here and hopes she feels better by then.  Donato Heinz. Odonell Shark, PT

## 2020-10-15 ENCOUNTER — Inpatient Hospital Stay: Payer: 59 | Attending: Gynecologic Oncology

## 2020-10-15 ENCOUNTER — Other Ambulatory Visit: Payer: Self-pay | Admitting: Gynecologic Oncology

## 2020-10-15 ENCOUNTER — Ambulatory Visit
Admission: RE | Admit: 2020-10-15 | Discharge: 2020-10-15 | Disposition: A | Discharge status: Home / Self Care | Payer: 59 | Source: Ambulatory Visit | Attending: Radiation Oncology | Admitting: Radiation Oncology

## 2020-10-15 ENCOUNTER — Encounter: Payer: Self-pay | Admitting: Gynecologic Oncology

## 2020-10-15 ENCOUNTER — Telehealth: Payer: Self-pay | Admitting: Oncology

## 2020-10-15 ENCOUNTER — Other Ambulatory Visit: Payer: Self-pay

## 2020-10-15 ENCOUNTER — Encounter: Payer: Self-pay | Admitting: Radiation Oncology

## 2020-10-15 VITALS — BP 130/75 | HR 100 | Temp 97.8°F | Resp 18 | Ht 64.0 in | Wt 145.8 lb

## 2020-10-15 DIAGNOSIS — C541 Malignant neoplasm of endometrium: Secondary | ICD-10-CM

## 2020-10-15 DIAGNOSIS — R369 Urethral discharge, unspecified: Secondary | ICD-10-CM

## 2020-10-15 DIAGNOSIS — R300 Dysuria: Secondary | ICD-10-CM

## 2020-10-15 DIAGNOSIS — Z08 Encounter for follow-up examination after completed treatment for malignant neoplasm: Secondary | ICD-10-CM | POA: Diagnosis not present

## 2020-10-15 NOTE — Progress Notes (Signed)
Radiation Oncology         [phone] ________________________________  Name: Emily [name] MRN: [id number]  Date: 10/15/2020  DOB: 08-Nov-1960  Follow-Up Visit Note  CC: [name], MD  [name], MD    ICD-10-CM   1. Endometrial cancer ([place])  C54.1     Diagnosis: Stage IIIA (pT3a, pN0) endometrioid endometrial adenocarcinoma, FIGO grade 2  Interval Since Last Radiation: One month, two weeks, and six days  HDR brachytherapy dates: 07/09/2020, 07/12/2020, 07/16/2020, 07/19/2020, and 07/26/2020  Narrative:  The patient returns today for routine follow-up. She completed chemotherapy with Carboplatin and Taxol on 08/17/2020 under the care of Dr. Alvy Bimler.  CT scan of abdomen and pelvis on 10/04/2020 showed resolution of omental soft tissue stranding since prior study. There was no evidence of recurrence or metastatic carcinoma within the abdomen or pelvis.   She was last seen by Dr. Berline Lopes on 10/08/2020, during which time she was prescribed antibiotics and Diflucan for Staphylococcus Simulans UTI.  On review of systems, she reports feeling better since completing chemotherapy. She denies further brownish discharge.  She has had some light yellow vaginal discharge..      Patient reports having some discomfort to her urethral area. Diagnosed with UTI and is on bactrim.  She is working with Dr Charisse March office to get Urology referral.  States she has shooting pains and swelling in the urethra which were similar to the previous event.  She reports this feeling up inside the proximal aspect of the urethra. Patient reports no concerns with appetite issues however, she does have some nausea.  Reports energy is moderate.            ALLERGIES:  is allergic to ceftin [cefuroxime], crestor [rosuvastatin calcium], and latex.  Meds: Current Outpatient Medications  Medication Sig Dispense Refill  . Cholecalciferol (VITAMIN D) 125 MCG (5000 UT) CAPS Take 5,000 Units by mouth daily.     . cyclobenzaprine (FLEXERIL) 10 MG tablet Take 1 tablet (10 mg total) by mouth 3 (three) times daily as needed for muscle spasms. 30 tablet 0  . famotidine (PEPCID) 20 MG tablet Take 20 mg by mouth 2 (two) times daily as needed for heartburn or indigestion.    . fluconazole (DIFLUCAN) 150 MG tablet Take 1 tablet (150 mg total) by mouth daily. 1 tablet 0  . pantoprazole (PROTONIX) 40 MG tablet Take 40 mg by mouth daily as needed (acid reduction).     . polyethylene glycol (MIRALAX / GLYCOLAX) 17 g packet Take 17 g by mouth daily as needed.    . sulfamethoxazole-trimethoprim (BACTRIM DS) 800-160 MG tablet Take 1 tablet by mouth 2 (two) times daily. 6 tablet 0  . traMADol (ULTRAM) 50 MG tablet Take 1 tablet (50 mg total) by mouth every 6 (six) hours as needed. (Patient not taking: No sig reported) 30 tablet 0   No current facility-administered medications for this encounter.    Physical Findings: The patient is in no acute distress. Patient is alert and oriented.  height is 5\' 4"  (1.626 m) and weight is 145 lb 12.8 oz (66.1 kg). Her temperature is 97.8 F (36.6 C). Her blood pressure is 130/75 and her pulse is 100. Her respiration is 18 and oxygen saturation is 100%.   Lungs are clear to auscultation bilaterally. Heart has regular rate and rhythm. No palpable cervical, supraclavicular, or axillary adenopathy. Abdomen soft, non-tender, normal bowel sounds. Pelvic exam deferred in light of recent radiation therapy and recent exam by Dr.  Berline Lopes.  Lab Findings: Lab Results  Component Value Date   WBC 8.2 10/03/2020   HGB 10.5 (L) 10/03/2020   HCT 32.0 (L) 10/03/2020   MCV 96.4 10/03/2020   PLT 195 10/03/2020    Radiographic Findings: CT ABDOMEN PELVIS W CONTRAST  Result Date: 10/04/2020 CLINICAL DATA:  Follow-up endometrial carcinoma. EXAM: CT ABDOMEN AND PELVIS WITH CONTRAST TECHNIQUE: Multidetector CT imaging of the abdomen and pelvis was performed using the standard protocol following  bolus administration of intravenous contrast. CONTRAST:  126mL OMNIPAQUE IOHEXOL 300 MG/ML  SOLN COMPARISON:  04/23/2020 FINDINGS: Lower Chest: No acute findings. Hepatobiliary: No hepatic masses identified. Gallbladder is unremarkable. No evidence of biliary ductal dilatation. Pancreas:  No mass or inflammatory changes. Spleen: Within normal limits in size and appearance. Adrenals/Urinary Tract: No masses identified. No evidence of ureteral calculi or hydronephrosis. Stomach/Bowel: No evidence of obstruction, inflammatory process or abnormal fluid collections. Normal appendix visualized. Vascular/Lymphatic: No pathologically enlarged lymph nodes. No abdominal aortic aneurysm. Reproductive: Prior hysterectomy noted. Adnexal regions are unremarkable in appearance. Other: Previously seen misty soft tissue stranding in the omental fat has resolved since previous study. No evidence of peritoneal thickened or nodularity. No evidence of ascites. Musculoskeletal:  No suspicious bone lesions identified. IMPRESSION: Resolution of omental soft tissue stranding since prior study. No evidence of recurrent or metastatic carcinoma within the abdomen or pelvis. Electronically Signed   By: Marlaine Hind M.D.   On: 10/04/2020 15:21    Impression: Stage IIIA (pT3a, pN0) endometrioid endometrial adenocarcinoma, FIGO grade 2  The patient is recovering from the effects of radiation.  Overall tolerated her radiation therapy well.  No apparent lasting effects from this treatment  Plan: The patient is scheduled to follow up with Dr. Berline Lopes on 03/05/2021. She will follow up with radiation oncology in late March.  Today the patient was given a vaginal dilator and instructions on its use.  She will start using this equipment once she has her bladder infection cleared up.  Total time spent in this encounter was 20 minutes which included reviewing the patient's most recent chemotherapy, follow-ups, CT scan of abdomen and pelvis,  physical examination, and documentation. ____________________________________   Blair Promise, PhD, MD  This document serves as a record of services personally performed by Gery Pray, MD. It was created on his behalf by Clerance Lav, a trained medical scribe. The creation of this record is based on the scribe's personal observations and the provider's statements to them. This document has been checked and approved by the attending provider.

## 2020-10-15 NOTE — Telephone Encounter (Addendum)
Anselm Jungling that Dr. Berline Lopes has put in a referral to [facility].  She would like to see Dr. Matilde Sprang if possible.    Referral faxed successfully to [facility]. Also called [facility] and advised them that Dalana would like to see Dr. Matilde Sprang.

## 2020-10-15 NOTE — Progress Notes (Signed)
Patient is here today for follow up to endometrial treatment completed November 2021.  Patient reports having some discomfort to her urethral area. Diagnosed with UTI and is bactrim.  She is working with Dr Charisse March office to get Urology referral.  States she has shooting pains and swelling in the urethra which were similar to the previous diagnosis.  Patient reports no concerns with appetite issues however, she does have some nausea.  Reports energy is moderate.  Vitals:   10/15/20 1113  BP: 130/75  Pulse: 100  Resp: 18  Temp: 97.8 F (36.6 C)  SpO2: 100%  Weight: 145 lb 12.8 oz (66.1 kg)  Height: 5\' 4"  (1.626 m)

## 2020-10-16 ENCOUNTER — Telehealth: Payer: Self-pay

## 2020-10-16 ENCOUNTER — Telehealth: Payer: Self-pay | Admitting: *Deleted

## 2020-10-16 LAB — URINE CULTURE: Culture: NO GROWTH

## 2020-10-16 NOTE — Telephone Encounter (Signed)
Per request from [facility] fax the last two [place] notes

## 2020-10-16 NOTE — Telephone Encounter (Signed)
She called and left a message. She has appt with Dr. Matilde Sprang with urology on 3/2. She is asking if you can extend the date of return to work to after that appt?

## 2020-10-17 ENCOUNTER — Telehealth: Payer: Self-pay

## 2020-10-17 NOTE — Telephone Encounter (Signed)
Called back. She would like to go back to work on 3/4, she works weekend [employer] and that would be her first day back at work. She verbalzied understanding and will get urologist to fill out any further paper work if needed.

## 2020-10-17 NOTE — Telephone Encounter (Signed)
Unfortunately, I cannot help her She would need her urologist to extend her leave

## 2020-10-17 NOTE — Telephone Encounter (Signed)
Called her and told the return to work release form is ready for pick up. She will pick it up tomorrow. Left out front with the receptionist.

## 2020-10-19 ENCOUNTER — Other Ambulatory Visit: Payer: Self-pay | Admitting: Gynecologic Oncology

## 2020-10-19 ENCOUNTER — Encounter: Payer: Self-pay | Admitting: Gynecologic Oncology

## 2020-10-19 DIAGNOSIS — N905 Atrophy of vulva: Secondary | ICD-10-CM

## 2020-10-19 MED ORDER — ESTROGENS, CONJUGATED 0.625 MG/GM VA CREA: 1.0000 | TOPICAL_CREAM | VAGINAL | 1 refills | Status: DC

## 2020-10-19 MED FILL — PREMARIN VAGINAL CREAM-APPL: 0.625 | 90 days supply | Qty: 30 | Fill #0

## 2020-10-25 ENCOUNTER — Telehealth: Payer: Self-pay | Admitting: *Deleted

## 2020-10-25 NOTE — Telephone Encounter (Signed)
   Notification sent for "required supporting documentation" with completed [employer] Disability form for NAIRI OSWALD claim: [id number] returned to [facility] on 10/10/2020 to [place] staff as instructed by [facility] H.I.M. Department reported standard work process for insurance and legal record release requests.  (SW) H.I.M. asked this nurse if request should be scanned in [facility]. or send medical records.  Responded to follow H.I.M. standards unknown to this nurse.    Connected with Mickey at [facility] today for clarification for (SW) H.I.M. second inquiry received asks if patient information previously faxed to avoid duplicates.     "[employer] received disability form 10/11/2020.  Request has been cancelled yet we will contact provider for information or records if needed."  No confirmation with request of specific patient information received with form.    This nurse includes [place] "Authorization of Release" with completed forms returned to Benefit and Claims administrators to use as needed for all [place] sites.        Claim [id number] is a second, recently opened along with initial claim: [id number] .  Trusts a current medication list, 10/04/2020 CT scan and 10/05/2020 office note faxed with this claim.

## 2020-10-29 ENCOUNTER — Encounter: Payer: Self-pay | Admitting: Gynecologic Oncology

## 2020-11-07 ENCOUNTER — Telehealth: Payer: Self-pay | Admitting: *Deleted

## 2020-11-07 NOTE — Telephone Encounter (Signed)
Per request from [facility] fax last office note and med list

## 2020-11-29 ENCOUNTER — Encounter: Payer: Self-pay | Admitting: Radiation Oncology

## 2020-12-06 ENCOUNTER — Ambulatory Visit
Admission: RE | Admit: 2020-12-06 | Discharge: 2020-12-06 | Disposition: A | Discharge status: Home / Self Care | Payer: 59 | Source: Ambulatory Visit | Attending: Radiation Oncology | Admitting: Radiation Oncology

## 2020-12-06 ENCOUNTER — Other Ambulatory Visit: Payer: Self-pay

## 2020-12-06 ENCOUNTER — Encounter: Payer: Self-pay | Admitting: Radiation Oncology

## 2020-12-06 DIAGNOSIS — Z923 Personal history of irradiation: Secondary | ICD-10-CM | POA: Diagnosis not present

## 2020-12-06 DIAGNOSIS — Z8542 Personal history of malignant neoplasm of other parts of uterus: Secondary | ICD-10-CM | POA: Diagnosis not present

## 2020-12-06 DIAGNOSIS — Z08 Encounter for follow-up examination after completed treatment for malignant neoplasm: Secondary | ICD-10-CM | POA: Diagnosis not present

## 2020-12-06 DIAGNOSIS — C541 Malignant neoplasm of endometrium: Secondary | ICD-10-CM

## 2020-12-06 HISTORY — DX: Personal history of irradiation: Z92.3

## 2020-12-06 NOTE — Progress Notes (Signed)
Emily [name] is here today for follow up post radiation to the pelvic.  They completed their radiation on: 07/26/2020   Does the patient complain of any of the following:  . Pain:0 . Abdominal bloating: no . Diarrhea/Constipation: occasionally diarrhea and constipation, flatulence  . Nausea/Vomiting: no . Vaginal Discharge: occasional yellow discharge that could possibly be urine . Blood in Urine or Stool: no . Urinary Issues (dysuria/incomplete emptying/ incontinence/ increased frequency/urgency): reports frequency and urgency . Does patient report using vaginal dilator 2-3 times a week and/or sexually active 2-3 times per week: yes . Post radiation skin changes: no   Additional comments if applicable: none   Vitals:   12/06/20 1136  BP: 118/75  Pulse: 68  Resp: 18  Temp: (!) 96.7 F (35.9 C)  TempSrc: Temporal  SpO2: 100%  Weight: 149 lb 2 oz (67.6 kg)  Height: 5\' 4"  (1.626 m)

## 2020-12-06 NOTE — Progress Notes (Signed)
Radiation Oncology         [phone] ________________________________  Name: Emily [name] MRN: [id number]  Date: 12/06/2020  DOB: May 07, 1961  Follow-Up Visit Note  CC: [name], MD  [name], MD    ICD-10-CM   1. Endometrial cancer ([place])  C54.1     Diagnosis: Stage IIIA (pT3a, pN0) endometrioid endometrial adenocarcinoma, FIGO grade 2  Interval Since Last Radiation: Four months, one week, and six days  HDR brachytherapy dates: 07/09/2020, 07/12/2020, 07/16/2020, 07/19/2020, and 07/26/2020  Narrative:  The patient returns today for routine follow-up. No significant interval history since her last visit.  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  On review of systems, she reports using her vaginal dilator 3 times a week.. She denies vaginal bleeding or discharge.  She denies any pelvic pain or abdominal bloating. she continues have some urinary frequency and urgency but overall her urinary symptoms have improved.  She will meet with urology next month..  She was given a prescription for Premarin vaginal cream but has not been using lately.  encouraged her to resume using this as directed.  ALLERGIES:  is allergic to ceftin [cefuroxime], crestor [rosuvastatin calcium], and latex.  Meds: Current Outpatient Medications  Medication Sig Dispense Refill  . Cholecalciferol (VITAMIN D) 125 MCG (5000 UT) CAPS Take 5,000 Units by mouth daily.    Marland Kitchen conjugated estrogens (PREMARIN) vaginal cream Place 1 Applicatorful vaginally 3 (three) times a week. 42.5 g 1  . cyclobenzaprine (FLEXERIL) 10 MG tablet Take 1 tablet (10 mg total) by mouth 3 (three) times daily as needed for muscle spasms. 30 tablet 0  . famotidine (PEPCID) 20 MG tablet Take 20 mg by mouth 2 (two) times daily as needed for heartburn or indigestion.    . pantoprazole (PROTONIX) 40 MG tablet Take 40 mg by mouth daily as needed (acid reduction).     . fluconazole (DIFLUCAN) 150 MG tablet  Take 1 tablet (150 mg total) by mouth daily. (Patient not taking: Reported on 12/06/2020) 1 tablet 0  . polyethylene glycol (MIRALAX / GLYCOLAX) 17 g packet Take 17 g by mouth daily as needed. (Patient not taking: Reported on 12/06/2020)    . sulfamethoxazole-trimethoprim (BACTRIM DS) 800-160 MG tablet Take 1 tablet by mouth 2 (two) times daily. (Patient not taking: Reported on 12/06/2020) 6 tablet 0  . traMADol (ULTRAM) 50 MG tablet Take 1 tablet (50 mg total) by mouth every 6 (six) hours as needed. (Patient not taking: No sig reported) 30 tablet 0   No current facility-administered medications for this encounter.    Physical Findings: The patient is in no acute distress. Patient is alert and oriented.  height is 5\' 4"  (1.626 m) and weight is 149 lb 2 oz (67.6 kg). Her temporal temperature is 96.7 F (35.9 C) (abnormal). Her blood pressure is 118/75 and her pulse is 68. Her respiration is 18 and oxygen saturation is 100%.   Lungs are clear to auscultation bilaterally. Heart has regular rate and rhythm. No palpable cervical, supraclavicular, or axillary adenopathy. Abdomen soft, non-tender, normal bowel sounds. On pelvic examination the external genitalia were unremarkable. A speculum exam was performed. There are no mucosal lesions noted in the vaginal vault.  No adhesions noted or agglutination noted. On bimanual and rectovaginal examination there were no pelvic masses appreciated.  Vaginal cuff intact. overall the patient tolerated the pelvic exam much better today compared to previous exams.  Lab Findings: Lab Results  Component  Value Date   WBC 8.2 10/03/2020   HGB 10.5 (L) 10/03/2020   HCT 32.0 (L) 10/03/2020   MCV 96.4 10/03/2020   PLT 195 10/03/2020    Radiographic Findings: No results found.  Impression: Stage IIIA (pT3a, pN0) endometrioid endometrial adenocarcinoma, FIGO grade 2  No evidence of recurrence on clinical exam today.   Plan: The patient is scheduled to follow up  with Dr. Berline Lopes on 03/05/2021. She will follow up with radiation oncology in six months.  Total time spent in this encounter was 20 minutes which included reviewing the patient's most recent interval history, physical examination, and documentation. ____________________________________   Blair Promise, PhD, MD  This document serves as a record of services personally performed by Gery Pray, MD. It was created on his behalf by Clerance Lav, a trained medical scribe. The creation of this record is based on the scribe's personal observations and the provider's statements to them. This document has been checked and approved by the attending provider.

## 2020-12-17 ENCOUNTER — Inpatient Hospital Stay: Payer: 59 | Attending: Gynecologic Oncology

## 2020-12-17 ENCOUNTER — Inpatient Hospital Stay: Payer: 59

## 2020-12-17 ENCOUNTER — Other Ambulatory Visit: Payer: Self-pay

## 2020-12-17 ENCOUNTER — Other Ambulatory Visit: Payer: Self-pay | Admitting: *Deleted

## 2020-12-17 ENCOUNTER — Telehealth: Payer: Self-pay | Admitting: *Deleted

## 2020-12-17 VITALS — BP 103/74 | HR 75 | Temp 98.0°F | Resp 16 | Ht 64.0 in | Wt 151.0 lb

## 2020-12-17 DIAGNOSIS — C541 Malignant neoplasm of endometrium: Secondary | ICD-10-CM | POA: Insufficient documentation

## 2020-12-17 NOTE — Telephone Encounter (Signed)
Ms. Klink called regarding some greenish vaginal discharge. After consulting with Dr. Berline Lopes, she was given the option to come into the clinic to self swab or to schedule an appt with Dr. Berline Lopes on Thursday. She elected to come into today to obtain a vaginal swab.

## 2020-12-17 NOTE — Progress Notes (Signed)
Per Dr. Berline Lopes, patient can come to clinic to self swab for vaginal infection. Patient was educated on collecting self swab vaginal sample.

## 2020-12-18 LAB — CERVICOVAGINAL ANCILLARY ONLY
Bacterial Vaginitis (gardnerella): NEGATIVE
Candida Glabrata: NEGATIVE
Candida Vaginitis: NEGATIVE
Comment: NEGATIVE
Comment: NEGATIVE
Comment: NEGATIVE
Comment: NEGATIVE
Trichomonas: NEGATIVE

## 2020-12-19 ENCOUNTER — Encounter: Payer: Self-pay | Admitting: Gynecologic Oncology

## 2021-01-01 DIAGNOSIS — N393 Stress incontinence (female) (male): Secondary | ICD-10-CM | POA: Diagnosis not present

## 2021-01-01 DIAGNOSIS — R8271 Bacteriuria: Secondary | ICD-10-CM | POA: Diagnosis not present

## 2021-01-01 DIAGNOSIS — R350 Frequency of micturition: Secondary | ICD-10-CM | POA: Diagnosis not present

## 2021-01-03 NOTE — Telephone Encounter (Signed)
LM for Ms Younts to call back to the office to discuss the Vitamin D 1000 unit vaginal suppository she requested. She was prescribed Vitamin E 200 unit suppository ~2 years ago by Dr. Alfred Levins per pharmacist at [facility].

## 2021-01-10 ENCOUNTER — Telehealth: Payer: Self-pay

## 2021-01-10 ENCOUNTER — Other Ambulatory Visit: Payer: Self-pay | Admitting: Hematology and Oncology

## 2021-01-10 DIAGNOSIS — R233 Spontaneous ecchymoses: Secondary | ICD-10-CM

## 2021-01-10 DIAGNOSIS — R238 Other skin changes: Secondary | ICD-10-CM | POA: Insufficient documentation

## 2021-01-10 NOTE — Telephone Encounter (Signed)
LM for Emily [name] stating that Dr. Berline Lopes would order Vitamin E suppositories for her but not the vitamin D. She can call the office back or send a MyChart message to respond to this message.

## 2021-01-10 NOTE — Telephone Encounter (Signed)
Spoke with pt regarding message below. Pt verbalizes understanding of appts on 01/11/21 and 01/14/21.

## 2021-01-10 NOTE — Telephone Encounter (Signed)
Emily [name] called back and said for Dr. Berline Lopes not to send in the vitamin E for her.

## 2021-01-10 NOTE — Telephone Encounter (Signed)
-----   Message from [name], MD sent at 01/10/2021  2:52 PM EDT ----- I have no opening tomorrow but if she can come in for labs tomorrow and then see me Monday morning 5/9 at 840 am, 30 mins that would be great ----- Message ----- From: Veverly Fells, RN Sent: 01/10/2021   2:22 PM EDT To: [name], MD  VM from this patient stating that she has developed large area of bruising with "red spots" after leaning her arm against dryer for 45 minutes while cleaning it. The patient is concerned about the bruising and wants to know if she needs to have labs done and/or have an office visit with you.   Please advise.

## 2021-01-11 ENCOUNTER — Other Ambulatory Visit: Payer: Self-pay

## 2021-01-11 ENCOUNTER — Inpatient Hospital Stay: Payer: 59 | Attending: Hematology and Oncology

## 2021-01-11 DIAGNOSIS — R233 Spontaneous ecchymoses: Secondary | ICD-10-CM | POA: Diagnosis not present

## 2021-01-11 DIAGNOSIS — Z9079 Acquired absence of other genital organ(s): Secondary | ICD-10-CM | POA: Diagnosis not present

## 2021-01-11 DIAGNOSIS — Z90710 Acquired absence of both cervix and uterus: Secondary | ICD-10-CM | POA: Insufficient documentation

## 2021-01-11 DIAGNOSIS — Z9221 Personal history of antineoplastic chemotherapy: Secondary | ICD-10-CM | POA: Insufficient documentation

## 2021-01-11 DIAGNOSIS — Z923 Personal history of irradiation: Secondary | ICD-10-CM | POA: Diagnosis not present

## 2021-01-11 DIAGNOSIS — Z90722 Acquired absence of ovaries, bilateral: Secondary | ICD-10-CM | POA: Diagnosis not present

## 2021-01-11 DIAGNOSIS — Z8542 Personal history of malignant neoplasm of other parts of uterus: Secondary | ICD-10-CM | POA: Diagnosis not present

## 2021-01-11 DIAGNOSIS — R7989 Other specified abnormal findings of blood chemistry: Secondary | ICD-10-CM | POA: Insufficient documentation

## 2021-01-11 DIAGNOSIS — R238 Other skin changes: Secondary | ICD-10-CM

## 2021-01-11 LAB — COMPREHENSIVE METABOLIC PANEL
ALT: 12 U/L (ref 0–44)
AST: 19 U/L (ref 15–41)
Albumin: 4.1 g/dL (ref 3.5–5.0)
Alkaline Phosphatase: 66 U/L (ref 38–126)
Anion gap: 12 (ref 5–15)
BUN: 27 mg/dL — ABNORMAL HIGH (ref 6–20)
CO2: 24 mmol/L (ref 22–32)
Calcium: 9.2 mg/dL (ref 8.9–10.3)
Chloride: 104 mmol/L (ref 98–111)
Creatinine, Ser: 1.02 mg/dL — ABNORMAL HIGH (ref 0.44–1.00)
GFR, Estimated: >60 mL/min (ref >60)
Glucose, Bld: 112 mg/dL — ABNORMAL HIGH (ref 70–99)
Potassium: 3.6 mmol/L (ref 3.5–5.1)
Sodium: 140 mmol/L (ref 135–145)
Total Bilirubin: <0.2 mg/dL — ABNORMAL LOW (ref 0.3–1.2)
Total Protein: 6.9 g/dL (ref 6.5–8.1)

## 2021-01-11 LAB — CBC WITH DIFFERENTIAL/PLATELET
Abs Immature Granulocytes: 0.02 10*3/uL (ref 0.00–0.07)
Basophils Absolute: 0.1 10*3/uL (ref 0.0–0.1)
Basophils Relative: 1 %
Eosinophils Absolute: 0.3 10*3/uL (ref 0.0–0.5)
Eosinophils Relative: 4 %
HCT: 35.6 % — ABNORMAL LOW (ref 36.0–46.0)
Hemoglobin: 11.9 g/dL — ABNORMAL LOW (ref 12.0–15.0)
Immature Granulocytes: 0 %
Lymphocytes Relative: 39 %
Lymphs Abs: 3.1 10*3/uL (ref 0.7–4.0)
MCH: 29 pg (ref 26.0–34.0)
MCHC: 33.4 g/dL (ref 30.0–36.0)
MCV: 86.6 fL (ref 80.0–100.0)
Monocytes Absolute: 0.6 10*3/uL (ref 0.1–1.0)
Monocytes Relative: 7 %
Neutro Abs: 3.9 10*3/uL (ref 1.7–7.7)
Neutrophils Relative %: 49 %
Platelets: 218 10*3/uL (ref 150–400)
RBC: 4.11 MIL/uL (ref 3.87–5.11)
RDW: 13 % (ref 11.5–15.5)
WBC: 7.9 10*3/uL (ref 4.0–10.5)
nRBC: 0 % (ref 0.0–0.2)

## 2021-01-11 LAB — PROTIME-INR
INR: 0.9 (ref 0.8–1.2)
Prothrombin Time: 12.1 seconds (ref 11.4–15.2)

## 2021-01-11 LAB — APTT: aPTT: 28 seconds (ref 24–36)

## 2021-01-14 ENCOUNTER — Telehealth: Payer: Self-pay | Admitting: Hematology and Oncology

## 2021-01-14 ENCOUNTER — Encounter: Payer: Self-pay | Admitting: Hematology and Oncology

## 2021-01-14 ENCOUNTER — Other Ambulatory Visit: Payer: Self-pay

## 2021-01-14 ENCOUNTER — Inpatient Hospital Stay (HOSPITAL_BASED_OUTPATIENT_CLINIC_OR_DEPARTMENT_OTHER): Payer: 59 | Admitting: Hematology and Oncology

## 2021-01-14 DIAGNOSIS — R233 Spontaneous ecchymoses: Secondary | ICD-10-CM | POA: Diagnosis not present

## 2021-01-14 DIAGNOSIS — C541 Malignant neoplasm of endometrium: Secondary | ICD-10-CM

## 2021-01-14 DIAGNOSIS — R238 Other skin changes: Secondary | ICD-10-CM | POA: Diagnosis not present

## 2021-01-14 DIAGNOSIS — Z9221 Personal history of antineoplastic chemotherapy: Secondary | ICD-10-CM | POA: Diagnosis not present

## 2021-01-14 DIAGNOSIS — Z90722 Acquired absence of ovaries, bilateral: Secondary | ICD-10-CM | POA: Diagnosis not present

## 2021-01-14 DIAGNOSIS — R8271 Bacteriuria: Secondary | ICD-10-CM | POA: Diagnosis not present

## 2021-01-14 DIAGNOSIS — Z90710 Acquired absence of both cervix and uterus: Secondary | ICD-10-CM | POA: Diagnosis not present

## 2021-01-14 DIAGNOSIS — Z9079 Acquired absence of other genital organ(s): Secondary | ICD-10-CM | POA: Diagnosis not present

## 2021-01-14 DIAGNOSIS — R7989 Other specified abnormal findings of blood chemistry: Secondary | ICD-10-CM

## 2021-01-14 DIAGNOSIS — Z923 Personal history of irradiation: Secondary | ICD-10-CM | POA: Diagnosis not present

## 2021-01-14 DIAGNOSIS — Z8542 Personal history of malignant neoplasm of other parts of uterus: Secondary | ICD-10-CM | POA: Diagnosis not present

## 2021-01-14 NOTE — Telephone Encounter (Signed)
Scheduled per los. Gave avs and calendar  

## 2021-01-14 NOTE — Assessment & Plan Note (Signed)
She has significant traumatic bruising Her coagulation studies and platelet count were normal She denies taking any supplements that could cause excessive bruises All her bruises are at the healing stages I recommend observation only

## 2021-01-14 NOTE — Progress Notes (Signed)
[place] OFFICE PROGRESS NOTE  Patient Care Team: [name], MD as PCP - General (Family Medicine)  ASSESSMENT & PLAN:  Endometrial cancer [facility] From the endometrial standpoint, she have no signs or symptoms to suggest cancer recurrence She will continue follow-up with Dr. Berline Lopes and radiation oncologist  Abnormal bruising She has significant traumatic bruising Her coagulation studies and platelet count were normal She denies taking any supplements that could cause excessive bruises All her bruises are at the healing stages I recommend observation only  Elevated serum creatinine We discussed causes for elevated creatinine In her situation, it is most likely cause from slight dehydration from inadequate oral fluid intake Elevated creatinine probably because slight anemia We have extensive discussions about the importance of adequate fluid intake Based on her weight and height, I recommend approximately 77 ounces of water per day We discussed strategies to ensure she drinks enough liquid especially when she is working We discussed checking her blood count again along with her renal function next month   No orders of the defined types were placed in this encounter.   All questions were answered. The patient knows to call the clinic with any problems, questions or concerns. The total time spent in the appointment was 20 minutes encounter with patients including review of chart and various tests results, discussions about plan of care and coordination of care plan   [name], MD 01/14/2021 9:41 AM  INTERVAL HISTORY: Please see below for problem oriented charting. She returns for further follow-up She had recent excessive bruising on the right upper extremity According to the patient, she was trying to clean her washing machine for approximately 47 minutes, she was leaning against the machine As soon as she finished, she noted significant bruises She denies  spontaneous bleeding elsewhere The patient denies any recent signs or symptoms of bleeding such as spontaneous epistaxis, hematuria or hematochezia. She denies abdominal pain, nausea or changes in bowel habits When she is at work, she drinks approximately 48 ounces of water  SUMMARY OF ONCOLOGIC HISTORY: Oncology History Overview Note  IHC MMR normal Endometrioid MSI-stable   Endometrial cancer ([place])  02/28/2020 Initial Diagnosis   The patient presented for an exam on 6/22.  At that time she endorsed episodes of postmenopausal bleeding starting in 2019-09-02 after her mother's death.  She had not had a menses in a number of years.  Patient's exam was limited by her intolerance in the position of the cervix.  Pap test was performed on 6/30 showing atypical glandular cells of undetermined significance. 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 She describes about a year long history previously of clear vaginal discharge. She saw her PCP for this complaint and given what was felt to be urethral swelling, she was treated for a UTI (lab testing did not show an infection). She has not been sexually active since her 49s. She endorses increased urinary frequency. After a year, the clear discharge became yellow (and at one point looked green). The patient was seen again and a pap was attempted in the office but she was unable to tolerate this. She performed a vaginal swab on herself that found yeast. She used Monistat multiple times with improvement in her discharge. She then began to have pale pink discharge. At this time, she was seen by a Urogynecologist at [facility] and it sound like she had urodynamic testing. She describes a raw feeling and burning with her urine. She  continues to have vaginal discharge which she notes has an ammonia-like smell. Her vaginal bleeding started again about a month ago. She briefly tried vaginal estrogen after seeing the  Urogynecologist but she felt that the bleeding worsened as did the ammonia smell, so she stopped.   03/16/2020 Initial Diagnosis   Endometrial cancer ([place])   03/21/2020 Imaging   1. Abnormally thickened endometrial complex measuring up to 12 mm, presumably related to known history of endometrial carcinoma. 2. Underlying fibroid uterus as detailed above. 3. Normal sonographic evaluation of the ovaries. No abnormal free fluid. Please note that the appendix appears to be potentially located in close proximity to the right ovary on this exam. As this patient is scheduled to undergo total hysterectomy with bilateral salpingo oophorectomy in the near future, close attention to this region at surgical intervention is recommended.     03/27/2020 Pathology Results   A. CUL DE SAC, ANTERIOR, BIOPSY:  -  Benign fibrovascular tissue with mesothelial hyperplasia  -  No carcinoma identified  -  See comment   B. MONS, RIGHT, BIOPSY:  -  Melanocytic nevus, intradermal type  -  See comment   C. LYMPH NODE, SENTINEL, RIGHT EXTERNAL ILIAC, BIOPSY:  -  No carcinoma identified in one lymph node (0/1)  -  See comment   D. PERITONEUM, LEFT SIDEWALL, BIOPSY:  -  Benign fibrovascular tissue with chronic inflammation  -  No carcinoma identified   E. LYMPH NODE, SENTINEL, LEFT EXTERNAL ILIAC, BIOPSY:  -  No carcinoma identified in one lymph node (0/1)  -  See comment   F. UTERUS, CERVIX, BILATERAL FALLOPIAN TUBES AND OVARIES:   Uterus:  -  Endometrioid carcinoma, FIGO grade 2  -  Leiomyomata (1.6 cm ; largest)  -  Carcinoma present in lymphoid aggregate in serosal adhesions  (lymphovascular space invasion)  -  See oncology table and comment below   Cervix:  -  No carcinoma identified   Bilateral Ovaries:  -  No carcinoma identified   Bilateral Fallopian tubes:  -  Carcinoma within lumen of fallopian tube  (left)   G. PERIURETHRAL, BIOPSY:  -  No carcinoma identified    03/27/2020 Surgery    Robotic-assisted laparoscopic total hysterectomy with bilateral salpingoophorectomy, SLN biopsy   On EUA, very narrow introitus most due to tight hymenal ring. Small mobile uterus. Hyperpigmented 0.36m plaque on right mons, biopsied. Urethral overall normal in appearance, given hyperemic appearance in clinic, biopsy taken. On intra-abdominal entry, some scarring noted on inferior aspect of the liver, anterior right diaphragm and left lobe of the liver. Omentum normal appearing. Mesentery of the small and large bowel as well as the bowel itself studded with 1-286minflammatory-appearing nodules. Appendix with same studding, otherwise normal in appearance. Uterus 6cm and normal appearing with the exception of inflammatory exudate on posterior aspect of the fundus. Bilateral adnexa normal appearing. Chocolate brown staining within most of the cul-de-sac. Inflammatory appearing nodules studding bilateral pelvic walls, anterior and posterior cul-de-sac c/w endometriosis. No adenopathy. No intra-abdominal or pelvic evidence of disease.   03/2020 Initial Biopsy   EMB - gr 1-2 EMCA   03/27/2020 Cancer Staging   Staging form: Corpus Uteri - Carcinoma and Carcinosarcoma, AJCC 8th Edition - Clinical stage from 03/27/2020: FIGO Stage IIIA (cT3a, cN0(sn), cM0) - Signed by GoHeath LarkMD on 04/24/2020   04/23/2020 Imaging   Status post hysterectomy and bilateral salpingo-oophorectomy.   Mild stranding along the anterior transverse mesocolon. No frank peritoneal nodularity or omental  caking. Attention on follow-up is suggested.   No findings specific for recurrent or metastatic disease.   Bladder is mildly thick-walled although underdistended.     04/27/2020 - 08/17/2020 Chemotherapy   The patient had carboplatin and taxol for chemotherapy treatment.     07/09/2020 - 07/26/2020 Radiation Therapy   VBT: 30 Gy, 5 fractions, HDR   10/04/2020 Imaging   Resolution of omental soft tissue stranding since prior study.  No evidence of recurrent or metastatic carcinoma within the abdomen or pelvis     REVIEW OF SYSTEMS:   Constitutional: Denies fevers, chills or abnormal weight loss Eyes: Denies blurriness of vision Ears, nose, mouth, throat, and face: Denies mucositis or sore throat Respiratory: Denies cough, dyspnea or wheezes Cardiovascular: Denies palpitation, chest discomfort or lower extremity swelling Gastrointestinal:  Denies nausea, heartburn or change in bowel habits Skin: Denies abnormal skin rashes Lymphatics: Denies new lymphadenopathy Neurological:Denies numbness, tingling or new weaknesses Behavioral/Psych: Mood is stable, no new changes  All other systems were reviewed with the patient and are negative.  I have reviewed the past medical history, past surgical history, social history and family history with the patient and they are unchanged from previous note.  ALLERGIES:  is allergic to ceftin [cefuroxime], crestor [rosuvastatin calcium], and latex.  MEDICATIONS:  Current Outpatient Medications  Medication Sig Dispense Refill  . Biotin 10000 MCG TABS Take 10,000 mcg by mouth daily.    . Cholecalciferol (VITAMIN D) 125 MCG (5000 UT) CAPS Take 5,000 Units by mouth daily.    Marland Kitchen conjugated estrogens (PREMARIN) vaginal cream PLACE 1 APPLICATORFUL VAGINALLY THREE TIMES A WEEK. 30 g 1  . famotidine (PEPCID) 20 MG tablet Take 20 mg by mouth 2 (two) times daily as needed for heartburn or indigestion.    . pantoprazole (PROTONIX) 40 MG tablet Take 40 mg by mouth daily as needed (acid reduction).      No current facility-administered medications for this visit.    PHYSICAL EXAMINATION: ECOG PERFORMANCE STATUS: 1 - Symptomatic but completely ambulatory  Vitals:   01/14/21 0855  BP: 115/65  Pulse: 65  Resp: 18  Temp: (!) 97.4 F (36.3 C)  SpO2: 100%   Filed Weights   01/14/21 0855  Weight: 153 lb 3.2 oz (69.5 kg)    GENERAL:alert, no distress and comfortable SKIN: Noted  significant skin bruising on the right upper extremity  NEURO: alert & oriented x 3 with fluent speech, no focal motor/sensory deficits  LABORATORY DATA:  I have reviewed the data as listed    Component Value Date/Time   NA 140 01/11/2021 0744   K 3.6 01/11/2021 0744   CL 104 01/11/2021 0744   CO2 24 01/11/2021 0744   GLUCOSE 112 (H) 01/11/2021 0744   BUN 27 (H) 01/11/2021 0744   CREATININE 1.02 (H) 01/11/2021 0744   CREATININE 0.87 10/03/2020 1554   CREATININE 0.89 12/04/2015 0901   CALCIUM 9.2 01/11/2021 0744   PROT 6.9 01/11/2021 0744   ALBUMIN 4.1 01/11/2021 0744   AST 19 01/11/2021 0744   AST 19 10/03/2020 1554   ALT 12 01/11/2021 0744   ALT 12 10/03/2020 1554   ALKPHOS 66 01/11/2021 0744   BILITOT <0.2 (L) 01/11/2021 0744   BILITOT 0.3 10/03/2020 1554   GFRNONAA >60 01/11/2021 0744   GFRNONAA >60 10/03/2020 1554   GFRAA >60 05/18/2020 0941    No results found for: SPEP, UPEP  Lab Results  Component Value Date   WBC 7.9 01/11/2021   NEUTROABS 3.9 01/11/2021  HGB 11.9 (L) 01/11/2021   HCT 35.6 (L) 01/11/2021   MCV 86.6 01/11/2021   PLT 218 01/11/2021      Chemistry      Component Value Date/Time   NA 140 01/11/2021 0744   K 3.6 01/11/2021 0744   CL 104 01/11/2021 0744   CO2 24 01/11/2021 0744   BUN 27 (H) 01/11/2021 0744   CREATININE 1.02 (H) 01/11/2021 0744   CREATININE 0.87 10/03/2020 1554   CREATININE 0.89 12/04/2015 0901      Component Value Date/Time   CALCIUM 9.2 01/11/2021 0744   ALKPHOS 66 01/11/2021 0744   AST 19 01/11/2021 0744   AST 19 10/03/2020 1554   ALT 12 01/11/2021 0744   ALT 12 10/03/2020 1554   BILITOT <0.2 (L) 01/11/2021 0744   BILITOT 0.3 10/03/2020 1554

## 2021-01-14 NOTE — Assessment & Plan Note (Signed)
We discussed causes for elevated creatinine In her situation, it is most likely cause from slight dehydration from inadequate oral fluid intake Elevated creatinine probably because slight anemia We have extensive discussions about the importance of adequate fluid intake Based on her weight and height, I recommend approximately 77 ounces of water per day We discussed strategies to ensure she drinks enough liquid especially when she is working We discussed checking her blood count again along with her renal function next month

## 2021-01-14 NOTE — Assessment & Plan Note (Signed)
From the endometrial standpoint, she have no signs or symptoms to suggest cancer recurrence She will continue follow-up with Dr. Berline Lopes and radiation oncologist

## 2021-02-11 ENCOUNTER — Encounter: Payer: Self-pay | Admitting: Gynecologic Oncology

## 2021-02-14 ENCOUNTER — Other Ambulatory Visit: Payer: Self-pay

## 2021-02-14 ENCOUNTER — Inpatient Hospital Stay: Payer: 59 | Attending: Gynecologic Oncology

## 2021-02-14 ENCOUNTER — Other Ambulatory Visit: Payer: Self-pay | Admitting: Hematology and Oncology

## 2021-02-14 ENCOUNTER — Other Ambulatory Visit: Payer: Self-pay | Admitting: Oncology

## 2021-02-14 ENCOUNTER — Inpatient Hospital Stay: Payer: 59 | Admitting: Hematology and Oncology

## 2021-02-14 DIAGNOSIS — Z90710 Acquired absence of both cervix and uterus: Secondary | ICD-10-CM | POA: Insufficient documentation

## 2021-02-14 DIAGNOSIS — Z9079 Acquired absence of other genital organ(s): Secondary | ICD-10-CM | POA: Insufficient documentation

## 2021-02-14 DIAGNOSIS — Z79899 Other long term (current) drug therapy: Secondary | ICD-10-CM | POA: Insufficient documentation

## 2021-02-14 DIAGNOSIS — C541 Malignant neoplasm of endometrium: Secondary | ICD-10-CM

## 2021-02-14 DIAGNOSIS — Z90722 Acquired absence of ovaries, bilateral: Secondary | ICD-10-CM | POA: Insufficient documentation

## 2021-02-14 DIAGNOSIS — E538 Deficiency of other specified B group vitamins: Secondary | ICD-10-CM | POA: Insufficient documentation

## 2021-02-14 DIAGNOSIS — Z923 Personal history of irradiation: Secondary | ICD-10-CM | POA: Insufficient documentation

## 2021-02-14 DIAGNOSIS — Z9221 Personal history of antineoplastic chemotherapy: Secondary | ICD-10-CM | POA: Insufficient documentation

## 2021-02-14 DIAGNOSIS — Z8542 Personal history of malignant neoplasm of other parts of uterus: Secondary | ICD-10-CM | POA: Insufficient documentation

## 2021-02-20 ENCOUNTER — Telehealth: Payer: Self-pay | Admitting: Hematology and Oncology

## 2021-02-20 NOTE — Telephone Encounter (Signed)
Scheduled per pt request. Called and spoke with pt, confirmed 6/28 appts. Pt request a phone call for the MD visit

## 2021-02-21 ENCOUNTER — Encounter: Payer: Self-pay | Admitting: Hematology and Oncology

## 2021-02-21 ENCOUNTER — Other Ambulatory Visit (HOSPITAL_COMMUNITY): Payer: Self-pay

## 2021-02-21 MED FILL — Estrogens, Conjugated Vaginal Cream 0.625 MG/GM: VAGINAL | 90 days supply | Qty: 30 | Fill #0 | Status: AC

## 2021-02-22 NOTE — Telephone Encounter (Signed)
Told Emily [name] that we cannot determine what diagnostic lab was performed on 12-17-20 that received the denial. Emily Hammett will f/u with her insurance company and possibly [place] billing to determine the code used for the billing of the lab test.

## 2021-03-03 NOTE — Progress Notes (Unsigned)
Gynecologic Oncology Return Clinic Visit  03/05/21  Reason for Visit: surveillance visit in the setting of advanced uterine cancer who completed adjuvant treatment in 08/2020  Treatment History: Oncology History Overview Note  IHC MMR normal Endometrioid MSI-stable   Endometrial cancer ([place])  02/28/2020 Initial Diagnosis   The patient presented for an exam on 6/22.  At that time she endorsed episodes of postmenopausal bleeding starting in 17-Aug-2019 after her mother's death.  She had not had a menses in a number of years.  Patient's exam was limited by her intolerance in the position of the cervix.  Pap test was performed on 6/30 showing atypical glandular cells of undetermined significance. 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 She describes about a year long history previously of clear vaginal discharge. She saw her PCP for this complaint and given what was felt to be urethral swelling, she was treated for a UTI (lab testing did not show an infection). She has not been sexually active since her 89s. She endorses increased urinary frequency. After a year, the clear discharge became yellow (and at one point looked green). The patient was seen again and a pap was attempted in the office but she was unable to tolerate this. She performed a vaginal swab on herself that found yeast. She used Monistat multiple times with improvement in her discharge. She then began to have pale pink discharge. At this time, she was seen by a Urogynecologist at [facility] and it sound like she had urodynamic testing. She describes a raw feeling and burning with her urine. She continues to have vaginal discharge which she notes has an ammonia-like smell. Her vaginal bleeding started again about a month ago. She briefly tried vaginal estrogen after seeing the Urogynecologist but she felt that the bleeding worsened as did the ammonia smell, so she stopped.   03/16/2020  Initial Diagnosis   Endometrial cancer ([place])   03/21/2020 Imaging   1. Abnormally thickened endometrial complex measuring up to 12 mm, presumably related to known history of endometrial carcinoma. 2. Underlying fibroid uterus as detailed above. 3. Normal sonographic evaluation of the ovaries. No abnormal free fluid. Please note that the appendix appears to be potentially located in close proximity to the right ovary on this exam. As this patient is scheduled to undergo total hysterectomy with bilateral salpingo oophorectomy in the near future, close attention to this region at surgical intervention is recommended.     03/27/2020 Pathology Results   A. CUL DE SAC, ANTERIOR, BIOPSY:  -  Benign fibrovascular tissue with mesothelial hyperplasia  -  No carcinoma identified  -  See comment   B. MONS, RIGHT, BIOPSY:  -  Melanocytic nevus, intradermal type  -  See comment   C. LYMPH NODE, SENTINEL, RIGHT EXTERNAL ILIAC, BIOPSY:  -  No carcinoma identified in one lymph node (0/1)  -  See comment   D. PERITONEUM, LEFT SIDEWALL, BIOPSY:  -  Benign fibrovascular tissue with chronic inflammation  -  No carcinoma identified   E. LYMPH NODE, SENTINEL, LEFT EXTERNAL ILIAC, BIOPSY:  -  No carcinoma identified in one lymph node (0/1)  -  See comment   F. UTERUS, CERVIX, BILATERAL FALLOPIAN TUBES AND OVARIES:   Uterus:  -  Endometrioid carcinoma, FIGO grade 2  -  Leiomyomata (1.6 cm ; largest)  -  Carcinoma present in lymphoid aggregate in serosal adhesions  (lymphovascular space invasion)  -  See oncology table and comment  below   Cervix:  -  No carcinoma identified   Bilateral Ovaries:  -  No carcinoma identified   Bilateral Fallopian tubes:  -  Carcinoma within lumen of fallopian tube  (left)   G. PERIURETHRAL, BIOPSY:  -  No carcinoma identified    03/27/2020 Surgery   Robotic-assisted laparoscopic total hysterectomy with bilateral salpingoophorectomy, SLN biopsy   On EUA, very  narrow introitus most due to tight hymenal ring. Small mobile uterus. Hyperpigmented 0.5mm plaque on right mons, biopsied. Urethral overall normal in appearance, given hyperemic appearance in clinic, biopsy taken. On intra-abdominal entry, some scarring noted on inferior aspect of the liver, anterior right diaphragm and left lobe of the liver. Omentum normal appearing. Mesentery of the small and large bowel as well as the bowel itself studded with 1-2mm inflammatory-appearing nodules. Appendix with same studding, otherwise normal in appearance. Uterus 6cm and normal appearing with the exception of inflammatory exudate on posterior aspect of the fundus. Bilateral adnexa normal appearing. Chocolate brown staining within most of the cul-de-sac. Inflammatory appearing nodules studding bilateral pelvic walls, anterior and posterior cul-de-sac c/w endometriosis. No adenopathy. No intra-abdominal or pelvic evidence of disease.   03/2020 Initial Biopsy   EMB - gr 1-2 EMCA   03/27/2020 Cancer Staging   Staging form: Corpus Uteri - Carcinoma and Carcinosarcoma, AJCC 8th Edition - Clinical stage from 03/27/2020: FIGO Stage IIIA (cT3a, cN0(sn), cM0) - Signed by Gorsuch, Ni, MD on 04/24/2020    04/23/2020 Imaging   Status post hysterectomy and bilateral salpingo-oophorectomy.   Mild stranding along the anterior transverse mesocolon. No frank peritoneal nodularity or omental caking. Attention on follow-up is suggested.   No findings specific for recurrent or metastatic disease.   Bladder is mildly thick-walled although underdistended.     04/27/2020 - 08/17/2020 Chemotherapy   The patient had carboplatin and taxol for chemotherapy treatment.     07/09/2020 - 07/26/2020 Radiation Therapy   VBT: 30 Gy, 5 fractions, HDR   10/04/2020 Imaging   Resolution of omental soft tissue stranding since prior study. No evidence of recurrent or metastatic carcinoma within the abdomen or pelvis     Interval  History: ***  Saw Dr. Kinard for follow-up last on 3/31, was NED at that time.   Due to discharge that she described as green swab was done on 4/11 and negative for BV, trich and candida.   Past Medical/Surgical History: Past Medical History:  Diagnosis Date   Abnormal EKG 07/16/2015   Anxiety and depression    pt denies 7/16/   Bruises easily    Chest pain    Chest pain with moderate risk of acute coronary syndrome    Chest tightness    Difficult intubation    throat feels small pt has acid reflux ? scars due to acid    Elevated cholesterol    Endometrial cancer (HCC)    Family history of premature CAD 07/16/2015   Father had MI 40's, died at 50 of an MI    Fatigue    Fibroid    Fx sacrum/coccyx-closed (HCC)    Gastritis    GERD (gastroesophageal reflux disease)    History of radiation therapy 07/09/2020-07/26/2020   vaginal HDR brachytherapy    Dr James Kinard   Hyperlipidemia 07/16/2015   Osteopenia    Pigmented skin lesions    Pre-diabetes    Sinus problem    SOB (shortness of breath) on exertion    Urethra disorder    currently swollen per pt      Vitamin D deficiency    Weight gain     Past Surgical History:  Procedure Laterality Date   ADENOIDECTOMY     COLONOSCOPY     ROBOTIC ASSISTED TOTAL HYSTERECTOMY WITH BILATERAL SALPINGO OOPHERECTOMY Bilateral 03/27/2020   Procedure: XI ROBOTIC ASSISTED TOTAL HYSTERECTOMY WITH BILATERAL SALPINGO OOPHORECTOMY;  Surgeon: [name], MD;  Location: [facility];  Service: Gynecology;  Laterality: Bilateral;   SENTINEL NODE BIOPSY N/A 03/27/2020   Procedure: SENTINEL NODE BIOPSY, URETHRAL BIOPSY AND VAGINAL CULTURE;  Surgeon: [name], MD;  Location: [facility];  Service: Gynecology;  Laterality: N/A;   SMALL BOWEL ENTEROSCOPY     TONSILLECTOMY      Family History  Problem Relation Age of Onset   Atrial fibrillation Mother    Hypertension Mother    Stroke Mother    Arthritis Mother    Atrial fibrillation Father     Heart attack Father 34   Hypertension Father    COPD Father    Diabetes Father    Colon cancer Paternal Grandmother    Breast cancer Neg Hx     Social History   Socioeconomic History   Marital status: Single    Spouse name: Not on file   Number of children: 0   Years of education: 16   Highest education level: Not on file  Occupational History   Occupation: Therapist, sports  Tobacco Use   Smoking status: Never   Smokeless tobacco: Never  Vaping Use   Vaping Use: Never used  Substance and Sexual Activity   Alcohol use: Never   Drug use: No   Sexual activity: Not Currently  Other Topics Concern   Not on file  Social History Narrative   Not on file   Social Determinants of Health   Financial Resource Strain: Not on file  Food Insecurity: Not on file  Transportation Needs: Not on file  Physical Activity: Not on file  Stress: Not on file  Social Connections: Not on file    Current Medications:  Current Outpatient Medications:    Biotin 10000 MCG TABS, Take 10,000 mcg by mouth daily., Disp: , Rfl:    Cholecalciferol (VITAMIN D) 125 MCG (5000 UT) CAPS, Take 5,000 Units by mouth daily., Disp: , Rfl:    conjugated estrogens (PREMARIN) vaginal cream, PLACE 1 APPLICATORFUL VAGINALLY THREE TIMES A WEEK., Disp: 30 g, Rfl: 1   famotidine (PEPCID) 20 MG tablet, Take 20 mg by mouth 2 (two) times daily as needed for heartburn or indigestion., Disp: , Rfl:    pantoprazole (PROTONIX) 40 MG tablet, Take 40 mg by mouth daily as needed (acid reduction). , Disp: , Rfl:   Review of Systems: Denies appetite changes, fevers, chills, fatigue, unexplained weight changes. Denies hearing loss, neck lumps or masses, mouth sores, ringing in ears or voice changes. Denies cough or wheezing.  Denies shortness of breath. Denies chest pain or palpitations. Denies leg swelling. Denies abdominal distention, pain, blood in stools, constipation, diarrhea, nausea, vomiting, or early satiety. Denies pain with  intercourse, dysuria, frequency, hematuria or incontinence. Denies hot flashes, pelvic pain, vaginal bleeding or vaginal discharge.   Denies joint pain, back pain or muscle pain/cramps. Denies itching, rash, or wounds. Denies dizziness, headaches, numbness or seizures. Denies swollen lymph nodes or glands, denies easy bruising or bleeding. Denies anxiety, depression, confusion, or decreased concentration.  Physical Exam: There were no vitals taken for this visit. General: ***Alert, oriented, no acute distress. HEENT: ***Posterior oropharynx clear, sclera anicteric. Chest: ***Clear to  auscultation bilaterally.  ***Port site clean. Cardiovascular: ***Regular rate and rhythm, no murmurs. Abdomen: ***Obese, soft, nontender.  Normoactive bowel sounds.  No masses or hepatosplenomegaly appreciated.  ***Well-healed scar. Extremities: ***Grossly normal range of motion.  Warm, well perfused.  No edema bilaterally. Skin: ***No rashes or lesions noted. Lymphatics: ***No cervical, supraclavicular, or inguinal adenopathy. GU: Normal appearing external genitalia without erythema, excoriation, or lesions.  Speculum exam reveals ***.  Bimanual exam reveals ***.  ***Rectovaginal exam  confirms ___.  Laboratory & Radiologic Studies: ***  Assessment & Plan: MECKENZIE BALSLEY is a 60 y.o. 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   Emily [name]'s exam today is very reassuring and without evidence of any cancer.  She tolerated pelvic exam quite well today which she has the last several times.  I do not see any evidence of bleeding or discharge on exam.  I do not see evidence of urethral swelling or erythema.  She has some atrophy as well as radiation changes at the top of the vagina but all within what would be expected after treatment.   She was prescribed Bactrim and Diflucan this morning by Dr.  Alvy Bimler given her urine study results.  We discussed the plan to take antibiotics, and I have asked her to call with symptoms after. If her symptoms resolve, I would recommend we start some vaginal estrogen and pause any additional work-up. If she continues to have symptoms, then we will repeat urine culture (assure UTI adequately treated) and can refer to urology for additional work-up.   Patient voices on multiple occasions today significant concern that she has either bladder cancer or uterine cancer that spread to her bladder.  Based on all of my exams, findings at the time of surgery, and CT scan results, I tried to reassure Deaisha that there is nothing pointing to either of these 2 possibilities.  We discussed CT findings immediately after surgery showing some thickening of the bladder wall.  This would be a normal finding after surgery.  This has completely resolved as of her last CT scan.   I do think that she would benefit from being on vaginal estrogen.  We discussed vitamin E suppositories, which she has used in the past.  I favor vaginal estrogen given data that shows decreased risk of urinary tract infections in postmenopausal women.  We will plan to reevaluate this once she has finished treatment for her urinary tract infection.  *** minutes of total time was spent for this patient encounter, including preparation, face-to-face counseling with the patient and coordination of care, and documentation of the encounter.  Jeral Pinch, MD  Division of Gynecologic Oncology  Department of Obstetrics and Gynecology  [facility]

## 2021-03-05 ENCOUNTER — Encounter: Payer: Self-pay | Admitting: Hematology and Oncology

## 2021-03-05 ENCOUNTER — Inpatient Hospital Stay: Payer: 59

## 2021-03-05 ENCOUNTER — Other Ambulatory Visit: Payer: 59

## 2021-03-05 ENCOUNTER — Ambulatory Visit: Payer: 59 | Admitting: Hematology and Oncology

## 2021-03-05 ENCOUNTER — Other Ambulatory Visit: Payer: Self-pay

## 2021-03-05 ENCOUNTER — Ambulatory Visit: Payer: 59 | Admitting: Gynecologic Oncology

## 2021-03-05 ENCOUNTER — Telehealth: Payer: Self-pay

## 2021-03-05 DIAGNOSIS — R5383 Other fatigue: Secondary | ICD-10-CM | POA: Insufficient documentation

## 2021-03-05 DIAGNOSIS — Z9221 Personal history of antineoplastic chemotherapy: Secondary | ICD-10-CM | POA: Diagnosis not present

## 2021-03-05 DIAGNOSIS — Z79899 Other long term (current) drug therapy: Secondary | ICD-10-CM | POA: Diagnosis not present

## 2021-03-05 DIAGNOSIS — Z923 Personal history of irradiation: Secondary | ICD-10-CM | POA: Diagnosis not present

## 2021-03-05 DIAGNOSIS — R4589 Other symptoms and signs involving emotional state: Secondary | ICD-10-CM | POA: Insufficient documentation

## 2021-03-05 DIAGNOSIS — F418 Other specified anxiety disorders: Secondary | ICD-10-CM | POA: Diagnosis not present

## 2021-03-05 DIAGNOSIS — Z9079 Acquired absence of other genital organ(s): Secondary | ICD-10-CM | POA: Diagnosis not present

## 2021-03-05 DIAGNOSIS — E538 Deficiency of other specified B group vitamins: Secondary | ICD-10-CM

## 2021-03-05 DIAGNOSIS — C541 Malignant neoplasm of endometrium: Secondary | ICD-10-CM

## 2021-03-05 DIAGNOSIS — Z90722 Acquired absence of ovaries, bilateral: Secondary | ICD-10-CM | POA: Diagnosis not present

## 2021-03-05 DIAGNOSIS — Z8542 Personal history of malignant neoplasm of other parts of uterus: Secondary | ICD-10-CM | POA: Diagnosis not present

## 2021-03-05 DIAGNOSIS — Z90710 Acquired absence of both cervix and uterus: Secondary | ICD-10-CM | POA: Diagnosis not present

## 2021-03-05 LAB — CBC WITH DIFFERENTIAL (CANCER CENTER ONLY)
Abs Immature Granulocytes: 0.01 10*3/uL (ref 0.00–0.07)
Basophils Absolute: 0.1 10*3/uL (ref 0.0–0.1)
Basophils Relative: 1 %
Eosinophils Absolute: 0.4 10*3/uL (ref 0.0–0.5)
Eosinophils Relative: 6 %
HCT: 36.9 % (ref 36.0–46.0)
Hemoglobin: 12.2 g/dL (ref 12.0–15.0)
Immature Granulocytes: 0 %
Lymphocytes Relative: 41 %
Lymphs Abs: 2.7 10*3/uL (ref 0.7–4.0)
MCH: 28.8 pg (ref 26.0–34.0)
MCHC: 33.1 g/dL (ref 30.0–36.0)
MCV: 87 fL (ref 80.0–100.0)
Monocytes Absolute: 0.5 10*3/uL (ref 0.1–1.0)
Monocytes Relative: 7 %
Neutro Abs: 3 10*3/uL (ref 1.7–7.7)
Neutrophils Relative %: 45 %
Platelet Count: 204 10*3/uL (ref 150–400)
RBC: 4.24 MIL/uL (ref 3.87–5.11)
RDW: 14.1 % (ref 11.5–15.5)
WBC Count: 6.6 10*3/uL (ref 4.0–10.5)
nRBC: 0 % (ref 0.0–0.2)

## 2021-03-05 LAB — CMP (CANCER CENTER ONLY)
ALT: 10 U/L (ref 0–44)
AST: 15 U/L (ref 15–41)
Albumin: 4.2 g/dL (ref 3.5–5.0)
Alkaline Phosphatase: 59 U/L (ref 38–126)
Anion gap: 7 (ref 5–15)
BUN: 12 mg/dL (ref 6–20)
CO2: 26 mmol/L (ref 22–32)
Calcium: 9 mg/dL (ref 8.9–10.3)
Chloride: 106 mmol/L (ref 98–111)
Creatinine: 0.85 mg/dL (ref 0.44–1.00)
GFR, Estimated: >60 mL/min (ref >60)
Glucose, Bld: 104 mg/dL — ABNORMAL HIGH (ref 70–99)
Potassium: 3.9 mmol/L (ref 3.5–5.1)
Sodium: 139 mmol/L (ref 135–145)
Total Bilirubin: 0.3 mg/dL (ref 0.3–1.2)
Total Protein: 6.4 g/dL — ABNORMAL LOW (ref 6.5–8.1)

## 2021-03-05 NOTE — Assessment & Plan Note (Signed)
She has complaints of excessive fatigue despite normal CBC I will order thyroid function test to evaluate

## 2021-03-05 NOTE — Progress Notes (Signed)
HEMATOLOGY-ONCOLOGY ELECTRONIC VISIT PROGRESS NOTE  Patient Care Team: [name], MD as PCP - General (Family Medicine)  I connected with the patient via telephone call for virtual visit today  ASSESSMENT & PLAN:  Endometrial cancer [facility]) From the endometrial standpoint, she have no signs or symptoms to suggest cancer recurrence She will continue follow-up with Dr. Berline Lopes and radiation oncologist  Other fatigue She has complaints of excessive fatigue despite normal CBC I will order thyroid function test to evaluate  Vitamin B12 deficiency She has history of borderline B12 deficiency I will order a B12 level in her next visit  Anxiety about health She has significant concern about her health In her opinion, her liver enzymes, her white blood cell count and platelet count are all lower than before I tried to explain to the patient that there is a range of what we considered normal and the result is not fixed I tried to reassure her to the best of my ability We will repeat blood work as discussed  Orders Placed This Encounter  Procedures   Comprehensive metabolic panel    Standing Status:   Future    Standing Expiration Date:   03/05/2022   CBC with Differential/Platelet    Standing Status:   Future    Standing Expiration Date:   03/05/2022   Vitamin B12    Standing Status:   Future    Standing Expiration Date:   03/05/2022   TSH    Standing Status:   Future    Standing Expiration Date:   03/05/2022    INTERVAL HISTORY: Please see below for problem oriented charting. The purpose of today's visit is to review test results She complained of excessive fatigue She has significant concerns over changes to her white blood cell count, platelet count and liver enzymes She felt that her bowel movement has not been normal since surgery She is due to get colonoscopy in the near future She denies recent nausea or abdominal pain No recent infection  SUMMARY OF ONCOLOGIC  HISTORY: Oncology History Overview Note  IHC MMR normal Endometrioid MSI-stable   Endometrial cancer ([place])  02/28/2020 Initial Diagnosis   The patient presented for an exam on 6/22.  At that time she endorsed episodes of postmenopausal bleeding starting in September 05, 2019 after her mother's death.  She had not had a menses in a number of years.  Patient's exam was limited by her intolerance in the position of the cervix.  Pap test was performed on 6/30 showing atypical glandular cells of undetermined significance. 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 She describes about a year long history previously of clear vaginal discharge. She saw her PCP for this complaint and given what was felt to be urethral swelling, she was treated for a UTI (lab testing did not show an infection). She has not been sexually active since her 98s. She endorses increased urinary frequency. After a year, the clear discharge became yellow (and at one point looked green). The patient was seen again and a pap was attempted in the office but she was unable to tolerate this. She performed a vaginal swab on herself that found yeast. She used Monistat multiple times with improvement in her discharge. She then began to have pale pink discharge. At this time, she was seen by a Urogynecologist at [facility] and it sound like she had urodynamic testing. She describes a raw feeling and burning with her urine. She continues to have vaginal  discharge which she notes has an ammonia-like smell. Her vaginal bleeding started again about a month ago. She briefly tried vaginal estrogen after seeing the Urogynecologist but she felt that the bleeding worsened as did the ammonia smell, so she stopped.   03/16/2020 Initial Diagnosis   Endometrial cancer ([place])   03/21/2020 Imaging   1. Abnormally thickened endometrial complex measuring up to 12 mm, presumably related to known history of endometrial  carcinoma. 2. Underlying fibroid uterus as detailed above. 3. Normal sonographic evaluation of the ovaries. No abnormal free fluid. Please note that the appendix appears to be potentially located in close proximity to the right ovary on this exam. As this patient is scheduled to undergo total hysterectomy with bilateral salpingo oophorectomy in the near future, close attention to this region at surgical intervention is recommended.     03/27/2020 Pathology Results   A. CUL DE SAC, ANTERIOR, BIOPSY:  -  Benign fibrovascular tissue with mesothelial hyperplasia  -  No carcinoma identified  -  See comment   B. MONS, RIGHT, BIOPSY:  -  Melanocytic nevus, intradermal type  -  See comment   C. LYMPH NODE, SENTINEL, RIGHT EXTERNAL ILIAC, BIOPSY:  -  No carcinoma identified in one lymph node (0/1)  -  See comment   D. PERITONEUM, LEFT SIDEWALL, BIOPSY:  -  Benign fibrovascular tissue with chronic inflammation  -  No carcinoma identified   E. LYMPH NODE, SENTINEL, LEFT EXTERNAL ILIAC, BIOPSY:  -  No carcinoma identified in one lymph node (0/1)  -  See comment   F. UTERUS, CERVIX, BILATERAL FALLOPIAN TUBES AND OVARIES:   Uterus:  -  Endometrioid carcinoma, FIGO grade 2  -  Leiomyomata (1.6 cm ; largest)  -  Carcinoma present in lymphoid aggregate in serosal adhesions  (lymphovascular space invasion)  -  See oncology table and comment below   Cervix:  -  No carcinoma identified   Bilateral Ovaries:  -  No carcinoma identified   Bilateral Fallopian tubes:  -  Carcinoma within lumen of fallopian tube  (left)   G. PERIURETHRAL, BIOPSY:  -  No carcinoma identified    03/27/2020 Surgery   Robotic-assisted laparoscopic total hysterectomy with bilateral salpingoophorectomy, SLN biopsy   On EUA, very narrow introitus most due to tight hymenal ring. Small mobile uterus. Hyperpigmented 0.59m plaque on right mons, biopsied. Urethral overall normal in appearance, given hyperemic appearance  in clinic, biopsy taken. On intra-abdominal entry, some scarring noted on inferior aspect of the liver, anterior right diaphragm and left lobe of the liver. Omentum normal appearing. Mesentery of the small and large bowel as well as the bowel itself studded with 1-221minflammatory-appearing nodules. Appendix with same studding, otherwise normal in appearance. Uterus 6cm and normal appearing with the exception of inflammatory exudate on posterior aspect of the fundus. Bilateral adnexa normal appearing. Chocolate brown staining within most of the cul-de-sac. Inflammatory appearing nodules studding bilateral pelvic walls, anterior and posterior cul-de-sac c/w endometriosis. No adenopathy. No intra-abdominal or pelvic evidence of disease.   03/2020 Initial Biopsy   EMB - gr 1-2 EMCA   03/27/2020 Cancer Staging   Staging form: Corpus Uteri - Carcinoma and Carcinosarcoma, AJCC 8th Edition - Clinical stage from 03/27/2020: FIGO Stage IIIA (cT3a, cN0(sn), cM0) - Signed by GoHeath LarkMD on 04/24/2020    04/23/2020 Imaging   Status post hysterectomy and bilateral salpingo-oophorectomy.   Mild stranding along the anterior transverse mesocolon. No frank peritoneal nodularity or omental caking. Attention on  follow-up is suggested.   No findings specific for recurrent or metastatic disease.   Bladder is mildly thick-walled although underdistended.     04/27/2020 - 08/17/2020 Chemotherapy   The patient had carboplatin and taxol for chemotherapy treatment.     07/09/2020 - 07/26/2020 Radiation Therapy   VBT: 30 Gy, 5 fractions, HDR   10/04/2020 Imaging   Resolution of omental soft tissue stranding since prior study. No evidence of recurrent or metastatic carcinoma within the abdomen or pelvis     REVIEW OF SYSTEMS:   Constitutional: Denies fevers, chills or abnormal weight loss Eyes: Denies blurriness of vision Ears, nose, mouth, throat, and face: Denies mucositis or sore throat Respiratory: Denies  cough, dyspnea or wheezes Cardiovascular: Denies palpitation, chest discomfort Gastrointestinal:  Denies nausea, heartburn or change in bowel habits Skin: Denies abnormal skin rashes Lymphatics: Denies new lymphadenopathy or easy bruising Neurological:Denies numbness, tingling or new weaknesses Behavioral/Psych: Mood is stable, no new changes  Extremities: No lower extremity edema All other systems were reviewed with the patient and are negative.  I have reviewed the past medical history, past surgical history, social history and family history with the patient and they are unchanged from previous note.  ALLERGIES:  is allergic to ceftin [cefuroxime], crestor [rosuvastatin calcium], and latex.  MEDICATIONS:  Current Outpatient Medications  Medication Sig Dispense Refill   Biotin 10000 MCG TABS Take 10,000 mcg by mouth daily.     Cholecalciferol (VITAMIN D) 125 MCG (5000 UT) CAPS Take 5,000 Units by mouth daily.     conjugated estrogens (PREMARIN) vaginal cream PLACE 1 APPLICATORFUL VAGINALLY THREE TIMES A WEEK. 30 g 1   famotidine (PEPCID) 20 MG tablet Take 20 mg by mouth 2 (two) times daily as needed for heartburn or indigestion.     pantoprazole (PROTONIX) 40 MG tablet Take 40 mg by mouth daily as needed (acid reduction).      No current facility-administered medications for this visit.    PHYSICAL EXAMINATION: ECOG PERFORMANCE STATUS: 1 - Symptomatic but completely ambulatory  LABORATORY DATA:  I have reviewed the data as listed CMP Latest Ref Rng & Units 03/05/2021 01/11/2021 10/03/2020  Glucose 70 - 99 mg/dL 104(H) 112(H) 108(H)  BUN 6 - 20 mg/dL 12 27(H) 29(H)  Creatinine 0.44 - 1.00 mg/dL 0.85 1.02(H) 0.87  Sodium 135 - 145 mmol/L 139 140 141  Potassium 3.5 - 5.1 mmol/L 3.9 3.6 3.7  Chloride 98 - 111 mmol/L 106 104 106  CO2 22 - 32 mmol/L _0 Calcium 8.9 - 10.3 mg/dL 9.0 9.2 9.1  Total Protein 6.5 - 8.1 g/dL 6.4(L) 6.9 7.3  Total Bilirubin 0.3 - 1.2 mg/dL 0.3 <0.2(L)  0.3  Alkaline Phos 38 - 126 U/L 59 66 67  AST 15 - 41 U/L _1 ALT 0 - 44 U/L _2 Lab Results  Component Value Date   WBC 6.6 03/05/2021   HGB 12.2 03/05/2021   HCT 36.9 03/05/2021   MCV 87.0 03/05/2021   PLT 204 03/05/2021   NEUTROABS 3.0 03/05/2021     I discussed the assessment and treatment plan with the patient. The patient was provided an opportunity to ask questions and all were answered. The patient agreed with the plan and demonstrated an understanding of the instructions. The patient was advised to call back or seek an in-person evaluation if the symptoms worsen or if the condition fails to improve as anticipated.    I spent 20 minutes  for the appointment reviewing test results, discuss management and coordination of care.  [name], MD 03/05/2021 12:56 PM

## 2021-03-05 NOTE — Assessment & Plan Note (Signed)
She has significant concern about her health In her opinion, her liver enzymes, her white blood cell count and platelet count are all lower than before I tried to explain to the patient that there is a range of what we considered normal and the result is not fixed I tried to reassure her to the best of my ability We will repeat blood work as discussed

## 2021-03-05 NOTE — Assessment & Plan Note (Signed)
She has history of borderline B12 deficiency I will order a B12 level in her next visit

## 2021-03-05 NOTE — Telephone Encounter (Signed)
Emily [name] called and has rescheduled her appointment with Dr. Berline Lopes from 08/17 to 08/01. Patient is in agreement of new date and time.

## 2021-03-05 NOTE — Assessment & Plan Note (Signed)
From the endometrial standpoint, she have no signs or symptoms to suggest cancer recurrence She will continue follow-up with Dr. Berline Lopes and radiation oncologist

## 2021-03-05 NOTE — Telephone Encounter (Signed)
Received call from [facility] requesting to reschedule her appointment. Her appointment has been rescheduled from 03/05/21 to 04/24/21 per pt. request. Patient is in agreement of new date and time.

## 2021-03-06 ENCOUNTER — Telehealth: Payer: Self-pay | Admitting: Hematology and Oncology

## 2021-03-06 NOTE — Telephone Encounter (Signed)
Scheduled per 6/28 sch msg. Called pt and left a msg

## 2021-03-26 DIAGNOSIS — R350 Frequency of micturition: Secondary | ICD-10-CM | POA: Diagnosis not present

## 2021-03-26 DIAGNOSIS — N393 Stress incontinence (female) (male): Secondary | ICD-10-CM | POA: Diagnosis not present

## 2021-04-02 ENCOUNTER — Telehealth: Payer: Self-pay | Admitting: Oncology

## 2021-04-02 NOTE — Telephone Encounter (Signed)
Left a message to reschedule apt with Dr. Berline Lopes on 04/08/21.

## 2021-04-08 ENCOUNTER — Inpatient Hospital Stay: Payer: 59 | Attending: Gynecologic Oncology | Admitting: Gynecologic Oncology

## 2021-04-08 ENCOUNTER — Other Ambulatory Visit: Payer: Self-pay

## 2021-04-08 ENCOUNTER — Ambulatory Visit: Payer: 59 | Admitting: Gynecologic Oncology

## 2021-04-08 VITALS — BP 114/69 | HR 80 | Temp 97.0°F | Resp 18 | Ht 64.0 in | Wt 156.0 lb

## 2021-04-08 DIAGNOSIS — R102 Pelvic and perineal pain: Secondary | ICD-10-CM

## 2021-04-08 DIAGNOSIS — C541 Malignant neoplasm of endometrium: Secondary | ICD-10-CM

## 2021-04-08 DIAGNOSIS — Z8542 Personal history of malignant neoplasm of other parts of uterus: Secondary | ICD-10-CM

## 2021-04-08 DIAGNOSIS — Z90710 Acquired absence of both cervix and uterus: Secondary | ICD-10-CM | POA: Insufficient documentation

## 2021-04-08 DIAGNOSIS — N898 Other specified noninflammatory disorders of vagina: Secondary | ICD-10-CM | POA: Insufficient documentation

## 2021-04-08 DIAGNOSIS — Z923 Personal history of irradiation: Secondary | ICD-10-CM | POA: Insufficient documentation

## 2021-04-08 DIAGNOSIS — Z9221 Personal history of antineoplastic chemotherapy: Secondary | ICD-10-CM | POA: Insufficient documentation

## 2021-04-08 DIAGNOSIS — Z90722 Acquired absence of ovaries, bilateral: Secondary | ICD-10-CM | POA: Insufficient documentation

## 2021-04-08 NOTE — Progress Notes (Signed)
Gynecologic Oncology Return Clinic Visit  04/08/21  Reason for Visit: surveillance visit in the setting of advanced uterine cancer who completed adjuvant treatment in 08/2020  Treatment History: Oncology History Overview Note  IHC MMR normal Endometrioid MSI-stable   Endometrial cancer ([place])  02/28/2020 Initial Diagnosis   The patient presented for an exam on 6/22.  At that time she endorsed episodes of postmenopausal bleeding starting in 09/03/19 after her mother's death.  She had not had a menses in a number of years.  Patient's exam was limited by her intolerance in the position of the cervix.  Pap test was performed on 6/30 showing atypical glandular cells of undetermined significance. 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 She describes about a year long history previously of clear vaginal discharge. She saw her PCP for this complaint and given what was felt to be urethral swelling, she was treated for a UTI (lab testing did not show an infection). She has not been sexually active since her 53s. She endorses increased urinary frequency. After a year, the clear discharge became yellow (and at one point looked green). The patient was seen again and a pap was attempted in the office but she was unable to tolerate this. She performed a vaginal swab on herself that found yeast. She used Monistat multiple times with improvement in her discharge. She then began to have pale pink discharge. At this time, she was seen by a Urogynecologist at [facility] and it sound like she had urodynamic testing. She describes a raw feeling and burning with her urine. She continues to have vaginal discharge which she notes has an ammonia-like smell. Her vaginal bleeding started again about a month ago. She briefly tried vaginal estrogen after seeing the Urogynecologist but she felt that the bleeding worsened as did the ammonia smell, so she stopped.   03/16/2020  Initial Diagnosis   Endometrial cancer ([place])   03/21/2020 Imaging   1. Abnormally thickened endometrial complex measuring up to 12 mm, presumably related to known history of endometrial carcinoma. 2. Underlying fibroid uterus as detailed above. 3. Normal sonographic evaluation of the ovaries. No abnormal free fluid. Please note that the appendix appears to be potentially located in close proximity to the right ovary on this exam. As this patient is scheduled to undergo total hysterectomy with bilateral salpingo oophorectomy in the near future, close attention to this region at surgical intervention is recommended.     03/27/2020 Pathology Results   A. CUL DE SAC, ANTERIOR, BIOPSY:  -  Benign fibrovascular tissue with mesothelial hyperplasia  -  No carcinoma identified  -  See comment   B. MONS, RIGHT, BIOPSY:  -  Melanocytic nevus, intradermal type  -  See comment   C. LYMPH NODE, SENTINEL, RIGHT EXTERNAL ILIAC, BIOPSY:  -  No carcinoma identified in one lymph node (0/1)  -  See comment   D. PERITONEUM, LEFT SIDEWALL, BIOPSY:  -  Benign fibrovascular tissue with chronic inflammation  -  No carcinoma identified   E. LYMPH NODE, SENTINEL, LEFT EXTERNAL ILIAC, BIOPSY:  -  No carcinoma identified in one lymph node (0/1)  -  See comment   F. UTERUS, CERVIX, BILATERAL FALLOPIAN TUBES AND OVARIES:   Uterus:  -  Endometrioid carcinoma, FIGO grade 2  -  Leiomyomata (1.6 cm ; largest)  -  Carcinoma present in lymphoid aggregate in serosal adhesions  (lymphovascular space invasion)  -  See oncology table and comment  below   Cervix:  -  No carcinoma identified   Bilateral Ovaries:  -  No carcinoma identified   Bilateral Fallopian tubes:  -  Carcinoma within lumen of fallopian tube  (left)   G. PERIURETHRAL, BIOPSY:  -  No carcinoma identified    03/27/2020 Surgery   Robotic-assisted laparoscopic total hysterectomy with bilateral salpingoophorectomy, SLN biopsy   On EUA, very  narrow introitus most due to tight hymenal ring. Small mobile uterus. Hyperpigmented 0.83m plaque on right mons, biopsied. Urethral overall normal in appearance, given hyperemic appearance in clinic, biopsy taken. On intra-abdominal entry, some scarring noted on inferior aspect of the liver, anterior right diaphragm and left lobe of the liver. Omentum normal appearing. Mesentery of the small and large bowel as well as the bowel itself studded with 1-252minflammatory-appearing nodules. Appendix with same studding, otherwise normal in appearance. Uterus 6cm and normal appearing with the exception of inflammatory exudate on posterior aspect of the fundus. Bilateral adnexa normal appearing. Chocolate brown staining within most of the cul-de-sac. Inflammatory appearing nodules studding bilateral pelvic walls, anterior and posterior cul-de-sac c/w endometriosis. No adenopathy. No intra-abdominal or pelvic evidence of disease.   03/2020 Initial Biopsy   EMB - gr 1-2 EMCA   03/27/2020 Cancer Staging   Staging form: Corpus Uteri - Carcinoma and Carcinosarcoma, AJCC 8th Edition - Clinical stage from 03/27/2020: FIGO Stage IIIA (cT3a, cN0(sn), cM0) - Signed by GoHeath LarkMD on 04/24/2020    04/23/2020 Imaging   Status post hysterectomy and bilateral salpingo-oophorectomy.   Mild stranding along the anterior transverse mesocolon. No frank peritoneal nodularity or omental caking. Attention on follow-up is suggested.   No findings specific for recurrent or metastatic disease.   Bladder is mildly thick-walled although underdistended.     04/27/2020 - 08/17/2020 Chemotherapy   The patient had carboplatin and taxol for chemotherapy treatment.     07/09/2020 - 07/26/2020 Radiation Therapy   VBT: 30 Gy, 5 fractions, HDR   10/04/2020 Imaging   Resolution of omental soft tissue stranding since prior study. No evidence of recurrent or metastatic carcinoma within the abdomen or pelvis     Interval History: The  patient presents today for surveillance follow-up after completing adjuvant treatment for endometrial cancer at the end of last year.  She saw Dr. KiSondra Comeor follow-up last on 3/31, was NED at that time. Saw Dr. GoAlvy Bimlern 6/28.   Due to discharge that she described as green swab was done on 4/11 and negative for BV, trich and candida.  Since her last visit with me, she has also been seen by [facility].  She had a cystoscopy recently which was normal per her report.  Her urethral symptoms are overall better.  She is continuing to use estrogen twice a week and using her vaginal dilator twice a week as well.  She denies any vaginal bleeding.  She continues to have some vaginal discharge but notes that it is less now that she is using less estrogen and it is more yellow than green.  She reports regular bowel and bladder function.  She is gained about 20 pounds since finishing treatment which she attributes mostly to not having the energy to exercise.  She is back at work and struggling with working nights in terms of her sleep habits.  Past Medical/Surgical History: Past Medical History:  Diagnosis Date   Abnormal EKG 07/16/2015   Anxiety and depression    pt denies 7/16/   Bruises easily  Chest pain    Chest pain with moderate risk of acute coronary syndrome    Chest tightness    Difficult intubation    throat feels small pt has acid reflux ? scars due to acid    Elevated cholesterol    Endometrial cancer (HCC)    Family history of premature CAD Aug 06, 2015   Father had MI 54's, died at 60 of an MI    Fatigue    Fibroid    Fx sacrum/coccyx-closed ([place])    Gastritis    GERD (gastroesophageal reflux disease)    History of radiation therapy 07/09/2020-07/26/2020   vaginal HDR brachytherapy    Dr Gery Pray   Hyperlipidemia 08-06-2015   Osteopenia    Pigmented skin lesions    Pre-diabetes    Sinus problem    SOB (shortness of breath) on exertion    Urethra disorder    currently  swollen per pt    Vitamin D deficiency    Weight gain     Past Surgical History:  Procedure Laterality Date   ADENOIDECTOMY     COLONOSCOPY     ROBOTIC ASSISTED TOTAL HYSTERECTOMY WITH BILATERAL SALPINGO OOPHERECTOMY Bilateral 03/27/2020   Procedure: XI ROBOTIC ASSISTED TOTAL HYSTERECTOMY WITH BILATERAL SALPINGO OOPHORECTOMY;  Surgeon: [name], MD;  Location: [facility];  Service: Gynecology;  Laterality: Bilateral;   SENTINEL NODE BIOPSY N/A 03/27/2020   Procedure: SENTINEL NODE BIOPSY, URETHRAL BIOPSY AND VAGINAL CULTURE;  Surgeon: [name], MD;  Location: [facility];  Service: Gynecology;  Laterality: N/A;   SMALL BOWEL ENTEROSCOPY     TONSILLECTOMY      Family History  Problem Relation Age of Onset   Atrial fibrillation Mother    Hypertension Mother    Stroke Mother    Arthritis Mother    Atrial fibrillation Father    Heart attack Father 36   Hypertension Father    COPD Father    Diabetes Father    Colon cancer Paternal Grandmother    Breast cancer Neg Hx     Social History   Socioeconomic History   Marital status: Single    Spouse name: Not on file   Number of children: 0   Years of education: 16   Highest education level: Not on file  Occupational History   Occupation: Therapist, sports  Tobacco Use   Smoking status: Never   Smokeless tobacco: Never  Vaping Use   Vaping Use: Never used  Substance and Sexual Activity   Alcohol use: Never   Drug use: No   Sexual activity: Not Currently  Other Topics Concern   Not on file  Social History Narrative   Not on file   Social Determinants of Health   Financial Resource Strain: Not on file  Food Insecurity: Not on file  Transportation Needs: Not on file  Physical Activity: Not on file  Stress: Not on file  Social Connections: Not on file    Current Medications:  Current Outpatient Medications:    Biotin 10000 MCG TABS, Take 10,000 mcg by mouth daily., Disp: , Rfl:    Cholecalciferol (VITAMIN D) 125 MCG  (5000 UT) CAPS, Take 5,000 Units by mouth daily., Disp: , Rfl:    conjugated estrogens (PREMARIN) vaginal cream, PLACE 1 APPLICATORFUL VAGINALLY THREE TIMES A WEEK., Disp: 30 g, Rfl: 1   famotidine (PEPCID) 20 MG tablet, Take 20 mg by mouth 2 (two) times daily as needed for heartburn or indigestion., Disp: , Rfl:    pantoprazole (PROTONIX)  40 MG tablet, Take 40 mg by mouth daily as needed (acid reduction). , Disp: , Rfl:   Review of Systems: Pertinent positives include increased appetite, vaginal discharge. Denies fevers, chills, fatigue, unexplained weight changes. Denies hearing loss, neck lumps or masses, mouth sores, ringing in ears or voice changes. Denies cough or wheezing.  Denies shortness of breath. Denies chest pain or palpitations. Denies leg swelling. Denies abdominal distention, pain, blood in stools, constipation, diarrhea, nausea, vomiting, or early satiety. Denies pain with intercourse, dysuria, frequency, hematuria or incontinence. Denies hot flashes, pelvic pain, or vaginal bleeding.   Denies joint pain, back pain or muscle pain/cramps. Denies itching, rash, or wounds. Denies dizziness, headaches, numbness or seizures. Denies swollen lymph nodes or glands, denies easy bruising or bleeding. Denies anxiety, depression, confusion, or decreased concentration.  Physical Exam: BP 114/69 (BP Location: Right Arm, Patient Position: Sitting)   Pulse 80   Temp (!) 97 F (36.1 C) (Tympanic)   Resp 18   Ht 5' 4"  (1.626 m)   Wt 156 lb (70.8 kg)   SpO2 98%   BMI 26.78 kg/m  General: Alert, oriented, no acute distress. HEENT: Atraumatic, normocephalic, sclera anicteric. Chest: Unlabored breathing on room air. Abdomen: Obese, soft, nontender.  Normoactive bowel sounds.  No masses or hepatosplenomegaly appreciated.  Well-healed laparoscopic incisions. Extremities: Grossly normal range of motion.  Warm, well perfused.  No edema bilaterally. Skin: No rashes or lesions  noted. Lymphatics: No cervical, supraclavicular, or inguinal adenopathy. GU: Normal appearing external genitalia without erythema, excoriation, or lesions.  Speculum exam reveals mildly atrophic vaginal mucosa, some radiation changes at the vaginal apex.  No lesions or masses.  No bleeding.  Scant discharge.  Bimanual exam reveals cuff intact, no nodularity or masses.  Rectovaginal exam confirms these findings.  In general, exam is better tolerated today by the patient, some pain along the posterior fourchette.  Laboratory & Radiologic Studies: None new  Assessment & Plan: SHEANNA DAIL is a 60 y.o. 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  Patient is overall doing well and is NED on exam today.  She has had some urology work-up with normal findings per her report.  Her urethral symptoms have significantly improved.  She continues to have some vaginal discharge with estrogen use but now is using less estrogen and has noted improvement of her vaginal discharge.  She continues to have some vaginal discomfort, although significantly less than when I first met her.  She tolerates an exam with a regular size speculum today without much difficulty.  We discussed her point tenderness along the posterior fourchette and I think she could benefit from pelvic floor PT.  She has a lot going on and prefers to wait until her schedules a little bit more predictable.  She will reach out to me if and when she is ready for a referral to pelvic floor PT.  We discussed signs and symptoms that would be concerning for disease recurrence and the patient knows to call me if she develops any of these.  I will see her back for surveillance visit in 6 months.  She is seeing both Dr. Alvy Bimler and Dr. Sondra Come in September for follow-up.  32 minutes of total time was spent for this patient encounter, including preparation, face-to-face counseling with the patient  and coordination of care, and documentation of the encounter.  Jeral Pinch, MD  Division of Gynecologic Oncology  Department of Obstetrics and Gynecology  [facility]  of [facility]

## 2021-04-08 NOTE — Patient Instructions (Signed)
It was great to see you today!  There is no evidence of cancer recurrence on your exam.  Please let me know if you decide you would like to pursue some pelvic floor physical therapy before your next visit with me.  You can call the office here and we will put a referral in.  I will see you back in 6 months.  My schedule is not out past the end of this year.  Please call back in late December or early January to get scheduled to see me for the beginning of March.  As always, if you develop any new or concerning symptoms, such as vaginal bleeding or pelvic pain, before your next visit, please call the clinic to see me sooner.

## 2021-04-10 ENCOUNTER — Other Ambulatory Visit: Payer: Self-pay

## 2021-04-10 ENCOUNTER — Encounter: Payer: Self-pay | Admitting: Hematology and Oncology

## 2021-04-10 DIAGNOSIS — R7303 Prediabetes: Secondary | ICD-10-CM

## 2021-04-19 ENCOUNTER — Encounter: Payer: Self-pay | Admitting: Hematology and Oncology

## 2021-04-20 DIAGNOSIS — M542 Cervicalgia: Secondary | ICD-10-CM | POA: Diagnosis not present

## 2021-04-20 DIAGNOSIS — M25512 Pain in left shoulder: Secondary | ICD-10-CM | POA: Diagnosis not present

## 2021-04-22 ENCOUNTER — Telehealth: Payer: Self-pay | Admitting: Hematology and Oncology

## 2021-04-22 ENCOUNTER — Other Ambulatory Visit (HOSPITAL_COMMUNITY): Payer: Self-pay

## 2021-04-22 MED ORDER — LIDOCAINE 5 % EX PTCH
MEDICATED_PATCH | CUTANEOUS | 0 refills | Status: DC
  Filled 2021-04-22: qty 15, 15d supply, fill #0

## 2021-04-22 MED ORDER — PREDNISONE 5 MG PO TABS
ORAL_TABLET | ORAL | 0 refills | Status: DC
  Filled 2021-04-22: qty 21, 6d supply, fill #0

## 2021-04-22 NOTE — Telephone Encounter (Signed)
Scheduled appt per 8/15 sch msg. Called pt, no answer. Left msg with appt date and time.  

## 2021-04-23 DIAGNOSIS — M25512 Pain in left shoulder: Secondary | ICD-10-CM | POA: Diagnosis not present

## 2021-04-23 DIAGNOSIS — M542 Cervicalgia: Secondary | ICD-10-CM | POA: Diagnosis not present

## 2021-04-24 ENCOUNTER — Other Ambulatory Visit (HOSPITAL_COMMUNITY): Payer: Self-pay

## 2021-04-24 ENCOUNTER — Ambulatory Visit: Payer: 59 | Admitting: Gynecologic Oncology

## 2021-04-24 ENCOUNTER — Telehealth: Payer: Self-pay | Admitting: *Deleted

## 2021-04-24 DIAGNOSIS — M5412 Radiculopathy, cervical region: Secondary | ICD-10-CM | POA: Diagnosis not present

## 2021-04-24 MED ORDER — GABAPENTIN 100 MG PO CAPS
100.0000 mg | ORAL_CAPSULE | Freq: Three times a day (TID) | ORAL | 0 refills | Status: DC | PRN
  Filled 2021-04-24: qty 90, 30d supply, fill #0

## 2021-04-24 NOTE — Telephone Encounter (Signed)
Patient called office. Tentatively scheduled 05/03/21 w/[facility] (Dr. Nelva Bush) for steroid injection for bulging C 6-7 due to pain and experiencing numbness in fingers. Per patient, Dr. Nelva Bush or Levy Pupa, NP will contact Dr. Alvy Bimler for that patient's platelets are ok for procedure to reduce risk of bleeding. Patient states she wants this procedure as soon as scheduled.   This nurse informed patient that Dr. Alvy Bimler out of office today, but message will be sent to Dr. Alvy Bimler and nurse support for review on their return. Patient verbalized understanding. Message routed.   She requested a call from Dr. Alvy Bimler office nurse to confirm Dr. Sherron Flemings received request/call from [facility] and responded.

## 2021-04-25 NOTE — Telephone Encounter (Signed)
Called and given below message. She verbalized understanding. 

## 2021-04-25 NOTE — Telephone Encounter (Signed)
Her platelets are ok, pls proceed

## 2021-05-03 DIAGNOSIS — M5412 Radiculopathy, cervical region: Secondary | ICD-10-CM | POA: Diagnosis not present

## 2021-05-07 ENCOUNTER — Encounter: Payer: Self-pay | Admitting: Hematology and Oncology

## 2021-05-07 ENCOUNTER — Telehealth: Payer: Self-pay | Admitting: *Deleted

## 2021-05-07 ENCOUNTER — Encounter: Payer: Self-pay | Admitting: Radiation Oncology

## 2021-05-07 NOTE — Telephone Encounter (Signed)
CALLED PATIENT TO ASK ABOUT RESCHEDULING FU ON 06-06-21 PER GYN ONC REQUEST, PATIENT AGREED TO COME ON 07-09-21 @ 11 AM

## 2021-05-19 ENCOUNTER — Encounter: Payer: Self-pay | Admitting: Hematology and Oncology

## 2021-05-20 ENCOUNTER — Inpatient Hospital Stay: Payer: 59

## 2021-06-03 ENCOUNTER — Other Ambulatory Visit: Payer: 59

## 2021-06-04 ENCOUNTER — Telehealth: Payer: 59 | Admitting: Hematology and Oncology

## 2021-06-06 ENCOUNTER — Ambulatory Visit: Payer: Self-pay | Admitting: Radiation Oncology

## 2021-07-26 NOTE — Progress Notes (Signed)
Radiation Oncology         [phone] ________________________________  Name: Emily [name] MRN: [id number]  Date: 07/29/2021  DOB: 1961/06/03  Follow-Up Visit Note  CC: [name], MD (Inactive)  [name], MD    ICD-10-CM   1. Endometrial cancer ([place])  C54.1       Diagnosis: Stage IIIA (pT3a, pN0) endometrioid endometrial adenocarcinoma, FIGO grade 2  Interval Since Last Radiation: 1 year and 3 days   HDR brachytherapy dates: 07/09/2020, 07/12/2020, 07/16/2020, 07/19/2020, and 07/26/2020  Narrative:  The patient returns today for routine follow-up, she was last seen for follow-up on 12/06/20. Since her last visit, the patient followed up with Dr. Alvy Bimler on 01/14/21. During which time, the patient was noted to have elevated serum creatinine, likely attributed to inadequate oral fluid intake. The patient also reported abnormal bruising, though coagulation studies and platelet counts performed came back normal. Otherwise, the patient was noted as NED on examination.                        The patient then followed up with Dr. Berline Lopes on 03/05/21. The patient was again noted NED on examination other than some atrophy and expected radiation changes noted to the top of the vagina. The patient also met with Dr. Alvy Bimler on 03/05/21 and was prescribed Bactrim and Diflucan due to abnormal urine study results. Dr. Berline Lopes additionally recommended starting the patient on vaginal estrogen if her urinary symptoms resolve. Per Dr. Charisse March visit note on 03/05/21, the patient voiced significant concern/ anxiety regarding the possibility of developing bladder cancer or uterine cancer. The patient was reassured that there is no evidence to fuel her fears in regards to this.   The patient again followed up with Dr. Berline Lopes on 04/08/21. During which time, the patient reported seeing [facility] recently, where she underwent cytoscopy which revealed normal findings (per the patient). The  patient overall reported her urethral symptoms to have improved, and continued use of her estrogen twice a week, and her vaginal dilator twice a week. The patient also reported continued vaginal discharge but noted that it occurs less than before due to using less estrogen. Patient also reported  improvement of vaginal discomfort and was recommended pelvic floor PT in the near future.   She reports using her vaginal dilator approximately twice a week.  She denies any pain with use of the dilator and denies any bleeding afterward.  She has some very minimal yellowish discharge.  She denies any abdominal bloating or pelvic pain.  She denies any hematuria or rectal bleeding.  Her urinary symptoms of cleared up at this time.           Allergies:  is allergic to ceftin [cefuroxime], crestor [rosuvastatin calcium], and latex.  Meds: Current Outpatient Medications  Medication Sig Dispense Refill   Cholecalciferol (VITAMIN D) 125 MCG (5000 UT) CAPS Take 5,000 Units by mouth daily.     conjugated estrogens (PREMARIN) vaginal cream PLACE 1 APPLICATORFUL VAGINALLY THREE TIMES A WEEK. 30 g 1   pantoprazole (PROTONIX) 40 MG tablet Take 40 mg by mouth daily as needed (acid reduction).      Biotin 10000 MCG TABS Take 10,000 mcg by mouth daily. (Patient not taking: Reported on 07/29/2021)     famotidine (PEPCID) 20 MG tablet Take 20 mg by mouth 2 (two) times daily as needed for heartburn or indigestion. (Patient not taking: Reported on 07/29/2021)     lidocaine (LIDODERM)  5 % APPLY 1 PATCH TO THE SKIN ONCE DAILY (MAY WEAR UP TO 12 HOURS.) (Patient not taking: Reported on 07/29/2021) 15 patch 0   No current facility-administered medications for this encounter.    Physical Findings: The patient is in no acute distress. Patient is alert and oriented.  height is 5' 4" (1.626 m) and weight is 144 lb (65.3 kg). Her temporal temperature is 96.9 F (36.1 C) (abnormal). Her blood pressure is 123/72 and her pulse is  70. Her respiration is 18 and oxygen saturation is 99%. .  No significant changes. Lungs are clear to auscultation bilaterally. Heart has regular rate and rhythm. No palpable cervical, supraclavicular, or axillary adenopathy. Abdomen soft, non-tender, normal bowel sounds.  On pelvic examination the external genitalia were unremarkable.  She is noted to have some mild erythema to the area that was previously biopsied.  A speculum exam was performed. There are no mucosal lesions noted in the vaginal vault.  On bimanual and rectovaginal examination there were no pelvic masses appreciated.  Vaginal cuff intact.  Mild radiation changes noted at the cuff.  Rectal sphincter tone normal on exam.    Lab Findings: Lab Results  Component Value Date   WBC 6.6 03/05/2021   HGB 12.2 03/05/2021   HCT 36.9 03/05/2021   MCV 87.0 03/05/2021   PLT 204 03/05/2021    Radiographic Findings: No results found.  Impression:  Stage IIIA (pT3a, pN0) endometrioid endometrial adenocarcinoma, FIGO grade 2  The patient is doing much better with her symptoms at this time.  No evidence of recurrence on clinical exam today.  Plan: The patient will follow up with Dr. Berline Lopes in 3 months.  Follow-up in radiation oncology in 6 months.   23 minutes of total time was spent for this patient encounter, including preparation, face-to-face counseling with the patient and coordination of care, physical exam, and documentation of the encounter. ____________________________________  Blair Promise, PhD, MD   This document serves as a record of services personally performed by Gery Pray, MD. It was created on his behalf by Roney Mans, a trained medical scribe. The creation of this record is based on the scribe's personal observations and the provider's statements to them. This document has been checked and approved by the attending provider.

## 2021-07-29 ENCOUNTER — Other Ambulatory Visit: Payer: Self-pay

## 2021-07-29 ENCOUNTER — Inpatient Hospital Stay: Payer: 59 | Attending: Hematology and Oncology

## 2021-07-29 ENCOUNTER — Ambulatory Visit
Admission: RE | Admit: 2021-07-29 | Discharge: 2021-07-29 | Disposition: A | Discharge status: Home / Self Care | Payer: 59 | Source: Ambulatory Visit | Attending: Radiation Oncology | Admitting: Radiation Oncology

## 2021-07-29 ENCOUNTER — Encounter: Payer: Self-pay | Admitting: Radiation Oncology

## 2021-07-29 DIAGNOSIS — Z923 Personal history of irradiation: Secondary | ICD-10-CM | POA: Diagnosis not present

## 2021-07-29 DIAGNOSIS — Z8542 Personal history of malignant neoplasm of other parts of uterus: Secondary | ICD-10-CM | POA: Diagnosis not present

## 2021-07-29 DIAGNOSIS — Z79899 Other long term (current) drug therapy: Secondary | ICD-10-CM | POA: Diagnosis not present

## 2021-07-29 DIAGNOSIS — C541 Malignant neoplasm of endometrium: Secondary | ICD-10-CM

## 2021-07-29 DIAGNOSIS — R7989 Other specified abnormal findings of blood chemistry: Secondary | ICD-10-CM | POA: Insufficient documentation

## 2021-07-29 DIAGNOSIS — Z23 Encounter for immunization: Secondary | ICD-10-CM

## 2021-07-29 DIAGNOSIS — Z08 Encounter for follow-up examination after completed treatment for malignant neoplasm: Secondary | ICD-10-CM | POA: Diagnosis not present

## 2021-07-29 NOTE — Progress Notes (Signed)
Emily [name] is here today for follow up post radiation to the pelvic.  They completed their radiation on: 07/26/20  Does the patient complain of any of the following:  Pain: Patient denies pain.  Abdominal bloating: no Diarrhea/Constipation: no Nausea/Vomiting: no Vaginal Discharge: no Blood in Urine or Stool: no Urinary Issues (dysuria/incomplete emptying/ incontinence/ increased frequency/urgency): no Does patient report using vaginal dilator 2-3 times a week and/or sexually active 2-3 weeks: Patient reports using dilator 2 times per week. Post radiation skin changes: skin remains intact.   Additional comments if applicable:   Vitals:   07/29/21 1115  BP: 123/72  Pulse: 70  Resp: 18  Temp: (!) 96.9 F (36.1 C)  TempSrc: Temporal  SpO2: 99%  Weight: 144 lb (65.3 kg)  Height: 5\' 4"  (1.626 m)

## 2021-07-29 NOTE — Progress Notes (Signed)
   Covid-19 Vaccination Clinic  Name:  Emily [name]    MRN: [id number] DOB: Jul 07, 1961  07/29/2021  [name] was observed post Covid-19 immunization for 15 minutes without incident. She was provided with Vaccine Information Sheet and instruction to access the V-Safe system.   [name] was instructed to call 911 with any severe reactions post vaccine: Difficulty breathing  Swelling of face and throat  A fast heartbeat  A bad rash all over body  Dizziness and weakness   Immunizations Administered     Name Date Dose VIS Date Route   PFIZER Comrnaty(Gray TOP) Covid-19 Vaccine 07/29/2021 11:54 AM 0.3 mL 05/08/2021 Intramuscular   Manufacturer: [place]   Lot: [id number]   [place]: [phone]

## 2021-09-16 ENCOUNTER — Telehealth: Payer: Self-pay | Admitting: *Deleted

## 2021-09-16 NOTE — Telephone Encounter (Signed)
Shirley from radiation called and scheduled the patient for a follow up appt. Appt scheduled and Enid Derry will contact the patient.

## 2021-09-16 NOTE — Telephone Encounter (Signed)
CALLED PATIENT TO INFORM OF FU WITH DR. Berline Lopes ON 11-01-21- ARRIVAL TIME- 12:45 PM, LVM FOR A RETURN CALL

## 2021-09-17 ENCOUNTER — Telehealth: Payer: Self-pay | Admitting: *Deleted

## 2021-09-17 NOTE — Telephone Encounter (Signed)
RETURNED PATIENT'S PHONE CALL, LVM FOR A RETURN CALL 

## 2021-09-18 ENCOUNTER — Telehealth: Payer: Self-pay | Admitting: *Deleted

## 2021-09-18 NOTE — Telephone Encounter (Signed)
Returned patient's phone call, informed patient that the appt. with Dr. Berline Lopes has been moved to March 2 - arrival time- 12:45 pm, spoke with patient and she is aware of this appt.

## 2021-09-18 NOTE — Telephone Encounter (Signed)
Emily [name] from radiation called and the patient needs an appt on a Wednesday or Thursday. Appt moved from Friday 2/24 to Thursday 3/2.  Emily [name] will contact the patient

## 2021-11-01 ENCOUNTER — Ambulatory Visit: Payer: 59 | Admitting: Gynecologic Oncology

## 2021-11-07 ENCOUNTER — Other Ambulatory Visit: Payer: Self-pay

## 2021-11-07 ENCOUNTER — Other Ambulatory Visit (HOSPITAL_COMMUNITY): Payer: Self-pay

## 2021-11-07 ENCOUNTER — Inpatient Hospital Stay: Payer: 59 | Attending: Gynecologic Oncology | Admitting: Gynecologic Oncology

## 2021-11-07 ENCOUNTER — Encounter: Payer: Self-pay | Admitting: Gynecologic Oncology

## 2021-11-07 ENCOUNTER — Inpatient Hospital Stay: Payer: 59

## 2021-11-07 VITALS — BP 108/71 | HR 94 | Temp 97.8°F | Resp 16 | Ht 63.78 in | Wt 127.1 lb

## 2021-11-07 DIAGNOSIS — K219 Gastro-esophageal reflux disease without esophagitis: Secondary | ICD-10-CM | POA: Diagnosis not present

## 2021-11-07 DIAGNOSIS — Z9221 Personal history of antineoplastic chemotherapy: Secondary | ICD-10-CM | POA: Insufficient documentation

## 2021-11-07 DIAGNOSIS — R630 Anorexia: Secondary | ICD-10-CM | POA: Insufficient documentation

## 2021-11-07 DIAGNOSIS — E538 Deficiency of other specified B group vitamins: Secondary | ICD-10-CM

## 2021-11-07 DIAGNOSIS — Z923 Personal history of irradiation: Secondary | ICD-10-CM | POA: Insufficient documentation

## 2021-11-07 DIAGNOSIS — Z8542 Personal history of malignant neoplasm of other parts of uterus: Secondary | ICD-10-CM | POA: Diagnosis not present

## 2021-11-07 DIAGNOSIS — Z90722 Acquired absence of ovaries, bilateral: Secondary | ICD-10-CM | POA: Insufficient documentation

## 2021-11-07 DIAGNOSIS — R5383 Other fatigue: Secondary | ICD-10-CM

## 2021-11-07 DIAGNOSIS — C541 Malignant neoplasm of endometrium: Secondary | ICD-10-CM

## 2021-11-07 DIAGNOSIS — N898 Other specified noninflammatory disorders of vagina: Secondary | ICD-10-CM | POA: Insufficient documentation

## 2021-11-07 DIAGNOSIS — R634 Abnormal weight loss: Secondary | ICD-10-CM | POA: Diagnosis not present

## 2021-11-07 DIAGNOSIS — Z90710 Acquired absence of both cervix and uterus: Secondary | ICD-10-CM | POA: Insufficient documentation

## 2021-11-07 LAB — CBC (CANCER CENTER ONLY)
HCT: 40.8 % (ref 36.0–46.0)
Hemoglobin: 13.8 g/dL (ref 12.0–15.0)
MCH: 29.3 pg (ref 26.0–34.0)
MCHC: 33.8 g/dL (ref 30.0–36.0)
MCV: 86.6 fL (ref 80.0–100.0)
Platelet Count: 259 10*3/uL (ref 150–400)
RBC: 4.71 MIL/uL (ref 3.87–5.11)
RDW: 13.3 % (ref 11.5–15.5)
WBC Count: 7.7 10*3/uL (ref 4.0–10.5)
nRBC: 0 % (ref 0.0–0.2)

## 2021-11-07 LAB — CMP (CANCER CENTER ONLY)
ALT: 9 U/L (ref 0–44)
AST: 13 U/L — ABNORMAL LOW (ref 15–41)
Albumin: 5 g/dL (ref 3.5–5.0)
Alkaline Phosphatase: 74 U/L (ref 38–126)
Anion gap: 8 (ref 5–15)
BUN: 15 mg/dL (ref 8–23)
CO2: 28 mmol/L (ref 22–32)
Calcium: 10.3 mg/dL (ref 8.9–10.3)
Chloride: 103 mmol/L (ref 98–111)
Creatinine: 0.85 mg/dL (ref 0.44–1.00)
GFR, Estimated: >60 mL/min (ref >60)
Glucose, Bld: 135 mg/dL — ABNORMAL HIGH (ref 70–99)
Potassium: 3.5 mmol/L (ref 3.5–5.1)
Sodium: 139 mmol/L (ref 135–145)
Total Bilirubin: 0.5 mg/dL (ref 0.3–1.2)
Total Protein: 7.7 g/dL (ref 6.5–8.1)

## 2021-11-07 LAB — TSH: TSH: 1.679 u[IU]/mL (ref 0.308–3.960)

## 2021-11-07 LAB — VITAMIN B12: Vitamin B-12: 220 pg/mL (ref 180–914)

## 2021-11-07 MED ORDER — FAMOTIDINE 20 MG PO TABS
20.0000 mg | ORAL_TABLET | Freq: Two times a day (BID) | ORAL | 3 refills | Status: DC | PRN
  Filled 2021-11-07: qty 60, 30d supply, fill #0
  Filled 2022-04-16: qty 60, 30d supply, fill #1

## 2021-11-07 NOTE — Progress Notes (Signed)
Gynecologic Oncology Return Clinic Visit  11/07/21  Reason for Visit: surveillance visit in the setting of advanced uterine cancer who completed adjuvant treatment in 08/2020  Treatment History: Oncology History Overview Note  IHC MMR normal Endometrioid MSI-stable   Endometrial cancer ([place])  02/28/2020 Initial Diagnosis   The patient presented for an exam on 6/22.  At that time she endorsed episodes of postmenopausal bleeding starting in 09/03/2019 after her mother's death.  She had not had a menses in a number of years.  Patient's exam was limited by her intolerance in the position of the cervix.  Pap test was performed on 6/30 showing atypical glandular cells of undetermined significance. 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 She describes about a year long history previously of clear vaginal discharge. She saw her PCP for this complaint and given what was felt to be urethral swelling, she was treated for a UTI (lab testing did not show an infection). She has not been sexually active since her 70s. She endorses increased urinary frequency. After a year, the clear discharge became yellow (and at one point looked green). The patient was seen again and a pap was attempted in the office but she was unable to tolerate this. She performed a vaginal swab on herself that found yeast. She used Monistat multiple times with improvement in her discharge. She then began to have pale pink discharge. At this time, she was seen by a Urogynecologist at [facility] and it sound like she had urodynamic testing. She describes a raw feeling and burning with her urine. She continues to have vaginal discharge which she notes has an ammonia-like smell. Her vaginal bleeding started again about a month ago. She briefly tried vaginal estrogen after seeing the Urogynecologist but she felt that the bleeding worsened as did the ammonia smell, so she stopped.   03/16/2020  Initial Diagnosis   Endometrial cancer ([place])   03/21/2020 Imaging   1. Abnormally thickened endometrial complex measuring up to 12 mm, presumably related to known history of endometrial carcinoma. 2. Underlying fibroid uterus as detailed above. 3. Normal sonographic evaluation of the ovaries. No abnormal free fluid. Please note that the appendix appears to be potentially located in close proximity to the right ovary on this exam. As this patient is scheduled to undergo total hysterectomy with bilateral salpingo oophorectomy in the near future, close attention to this region at surgical intervention is recommended.     03/27/2020 Pathology Results   A. CUL DE SAC, ANTERIOR, BIOPSY:  -  Benign fibrovascular tissue with mesothelial hyperplasia  -  No carcinoma identified  -  See comment   B. MONS, RIGHT, BIOPSY:  -  Melanocytic nevus, intradermal type  -  See comment   C. LYMPH NODE, SENTINEL, RIGHT EXTERNAL ILIAC, BIOPSY:  -  No carcinoma identified in one lymph node (0/1)  -  See comment   D. PERITONEUM, LEFT SIDEWALL, BIOPSY:  -  Benign fibrovascular tissue with chronic inflammation  -  No carcinoma identified   E. LYMPH NODE, SENTINEL, LEFT EXTERNAL ILIAC, BIOPSY:  -  No carcinoma identified in one lymph node (0/1)  -  See comment   F. UTERUS, CERVIX, BILATERAL FALLOPIAN TUBES AND OVARIES:   Uterus:  -  Endometrioid carcinoma, FIGO grade 2  -  Leiomyomata (1.6 cm ; largest)  -  Carcinoma present in lymphoid aggregate in serosal adhesions  (lymphovascular space invasion)  -  See oncology table and comment  below   Cervix:  -  No carcinoma identified   Bilateral Ovaries:  -  No carcinoma identified   Bilateral Fallopian tubes:  -  Carcinoma within lumen of fallopian tube  (left)   G. PERIURETHRAL, BIOPSY:  -  No carcinoma identified    03/27/2020 Surgery   Robotic-assisted laparoscopic total hysterectomy with bilateral salpingoophorectomy, SLN biopsy   On EUA, very  narrow introitus most due to tight hymenal ring. Small mobile uterus. Hyperpigmented 0.97m plaque on right mons, biopsied. Urethral overall normal in appearance, given hyperemic appearance in clinic, biopsy taken. On intra-abdominal entry, some scarring noted on inferior aspect of the liver, anterior right diaphragm and left lobe of the liver. Omentum normal appearing. Mesentery of the small and large bowel as well as the bowel itself studded with 1-266minflammatory-appearing nodules. Appendix with same studding, otherwise normal in appearance. Uterus 6cm and normal appearing with the exception of inflammatory exudate on posterior aspect of the fundus. Bilateral adnexa normal appearing. Chocolate brown staining within most of the cul-de-sac. Inflammatory appearing nodules studding bilateral pelvic walls, anterior and posterior cul-de-sac c/w endometriosis. No adenopathy. No intra-abdominal or pelvic evidence of disease.   03/2020 Initial Biopsy   EMB - gr 1-2 EMCA   03/27/2020 Cancer Staging   Staging form: Corpus Uteri - Carcinoma and Carcinosarcoma, AJCC 8th Edition - Clinical stage from 03/27/2020: FIGO Stage IIIA (cT3a, cN0(sn), cM0) - Signed by GoHeath LarkMD on 04/24/2020    04/23/2020 Imaging   Status post hysterectomy and bilateral salpingo-oophorectomy.   Mild stranding along the anterior transverse mesocolon. No frank peritoneal nodularity or omental caking. Attention on follow-up is suggested.   No findings specific for recurrent or metastatic disease.   Bladder is mildly thick-walled although underdistended.     04/27/2020 - 08/17/2020 Chemotherapy   The patient had carboplatin and taxol for chemotherapy treatment.     07/09/2020 - 07/26/2020 Radiation Therapy   VBT: 30 Gy, 5 fractions, HDR   10/04/2020 Imaging   Resolution of omental soft tissue stranding since prior study. No evidence of recurrent or metastatic carcinoma within the abdomen or pelvis     Interval History: She  saw Dr. GoAlvy Bimlern 04/25/21 and Dr. KiSondra Comen 11/21. She was overall doing well at that visit. She noted improvement of her urethral symptoms as well as vaginal discharge.   Today, patient reports 1 to 2 months of fatigue and worsening GERD.  She struggles with GERD at baseline but thinks that it has been worse recently due to diet changes.  She denies any abdominal or pelvic pain.  She denies any vaginal bleeding.  She continues to have some vaginal discharge that she describes as yellow.  She is not using the estrogen vaginal cream as frequently (less than once a week now) that she feels like that she makes the vaginal odor she has worse.  She endorses regular bowel function.  She notes some urinary symptoms including irritation which she thinks is because she has been drinking more Diet Coke.  Notes decreased appetite, which she attributes to her GERD symptoms.  Thinks that she is lost about 20 pounds in the last few months.  There is not a weight from her November visit but she is down about 30 pounds from August.  Past Medical/Surgical History: Past Medical History:  Diagnosis Date   Abnormal EKG 07/16/2015   Anxiety and depression    pt denies 7/16/   Bruises easily    Chest pain  Chest pain with moderate risk of acute coronary syndrome    Chest tightness    Difficult intubation    throat feels small pt has acid reflux ? scars due to acid    Elevated cholesterol    Endometrial cancer (HCC)    Family history of premature CAD 07/27/15   Father had MI 17's, died at 38 of an MI    Fatigue    Fibroid    Fx sacrum/coccyx-closed ([place])    Gastritis    GERD (gastroesophageal reflux disease)    History of radiation therapy 07/09/2020-07/26/2020   vaginal HDR brachytherapy    Dr Gery Pray   Hyperlipidemia 07/27/2015   Osteopenia    Pigmented skin lesions    Pre-diabetes    Sinus problem    SOB (shortness of breath) on exertion    Urethra disorder    currently swollen per pt     Vitamin D deficiency    Weight gain     Past Surgical History:  Procedure Laterality Date   ADENOIDECTOMY     COLONOSCOPY     ROBOTIC ASSISTED TOTAL HYSTERECTOMY WITH BILATERAL SALPINGO OOPHERECTOMY Bilateral 03/27/2020   Procedure: XI ROBOTIC ASSISTED TOTAL HYSTERECTOMY WITH BILATERAL SALPINGO OOPHORECTOMY;  Surgeon: [name], MD;  Location: [facility];  Service: Gynecology;  Laterality: Bilateral;   SENTINEL NODE BIOPSY N/A 03/27/2020   Procedure: SENTINEL NODE BIOPSY, URETHRAL BIOPSY AND VAGINAL CULTURE;  Surgeon: [name], MD;  Location: [facility];  Service: Gynecology;  Laterality: N/A;   SMALL BOWEL ENTEROSCOPY     TONSILLECTOMY      Family History  Problem Relation Age of Onset   Atrial fibrillation Mother    Hypertension Mother    Stroke Mother    Arthritis Mother    Atrial fibrillation Father    Heart attack Father 56   Hypertension Father    COPD Father    Diabetes Father    Colon cancer Paternal Grandmother    Breast cancer Neg Hx     Social History   Socioeconomic History   Marital status: Single    Spouse name: Not on file   Number of children: 0   Years of education: 16   Highest education level: Not on file  Occupational History   Occupation: Therapist, sports  Tobacco Use   Smoking status: Never   Smokeless tobacco: Never  Vaping Use   Vaping Use: Never used  Substance and Sexual Activity   Alcohol use: Never   Drug use: No   Sexual activity: Not Currently  Other Topics Concern   Not on file  Social History Narrative   Not on file   Social Determinants of Health   Financial Resource Strain: Not on file  Food Insecurity: Not on file  Transportation Needs: Not on file  Physical Activity: Not on file  Stress: Not on file  Social Connections: Not on file    Current Medications:  Current Outpatient Medications:    Cholecalciferol (VITAMIN D) 125 MCG (5000 UT) CAPS, Take 5,000 Units by mouth daily., Disp: , Rfl:    Biotin 10000 MCG TABS,  Take 10,000 mcg by mouth daily. (Patient not taking: Reported on 07/29/2021), Disp: , Rfl:    famotidine (PEPCID) 20 MG tablet, Take 1 tablet (20 mg total) by mouth 2 (two) times daily as needed for heartburn or indigestion., Disp: 60 tablet, Rfl: 3   lidocaine (LIDODERM) 5 %, APPLY 1 PATCH TO THE SKIN ONCE DAILY (MAY WEAR UP TO 12  HOURS.) (Patient not taking: Reported on 07/29/2021), Disp: 15 patch, Rfl: 0   pantoprazole (PROTONIX) 40 MG tablet, Take 40 mg by mouth daily as needed (acid reduction).  (Patient not taking: Reported on 11/07/2021), Disp: , Rfl:   Review of Systems: + Fatigue, voice changes, urinary frequency, discharge. Denies appetite changes, fevers, chills, unexplained weight changes. Denies hearing loss, neck lumps or masses, mouth sores, ringing in ears. Denies cough or wheezing.  Denies shortness of breath. Denies chest pain or palpitations. Denies leg swelling. Denies abdominal distention, pain, blood in stools, constipation, diarrhea, nausea, vomiting, or early satiety. Denies pain with intercourse, dysuria, hematuria or incontinence. Denies hot flashes, pelvic pain, vaginal bleeding.   Denies joint pain, back pain or muscle pain/cramps. Denies itching, rash, or wounds. Denies dizziness, headaches, numbness or seizures. Denies swollen lymph nodes or glands, denies easy bruising or bleeding. Denies anxiety, depression, confusion, or decreased concentration.  Physical Exam: BP 108/71 (BP Location: Left Arm, Patient Position: Sitting)    Pulse 94    Temp 97.8 F (36.6 C) (Oral)    Resp 16    Ht 5' 3.78" (1.62 m)    Wt 127 lb 1.6 oz (57.7 kg)    SpO2 99%    BMI 21.97 kg/m  General: Alert, oriented, no acute distress. HEENT: Normocephalic, atraumatic, sclera anicteric.  Voice is hoarse. Chest: Clear to auscultation bilaterally.  No wheezes or rhonchi. Cardiovascular: Regular rate and rhythm, no murmurs. Abdomen: soft, nontender.  Normoactive bowel sounds.  No masses or  hepatosplenomegaly appreciated.  Well-healed incisions. Extremities: Grossly normal range of motion.  Warm, well perfused.  No edema bilaterally. Skin: No rashes or lesions noted. Lymphatics: No cervical, supraclavicular, or inguinal adenopathy. GU: Normal appearing external genitalia without erythema, excoriation, or lesions.  Speculum exam reveals mildly atrophic vaginal mucosa, minimal yellow-tinged discharge at the vaginal apex, no lesions noted.  Bimanual exam reveals vaginal cuff is smooth, no nodularity or masses.  Rectovaginal exam confirms these findings.  Laboratory & Radiologic Studies: None new  Assessment & Plan: REBIE PEALE is a 61 y.o. 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  Completed adjuvant chemotherapy and vaginal brachytherapy in 08/2020. MSI stable.   Although the patient is NED on exam today, she is having a number of symptoms that raise my concern for possible cancer recurrence.  I suggested that we get basic lab work today and move forward with scheduling CT scan.  Patient was amenable to this.  I will call her once I have the results.  If no evidence of cancer recurrence, then we will continue with surveillance visits every 3 months alternating with radiation oncology.  Patient requested a prescription for Pepcid be sent to her pharmacy, which was done today.  32 minutes of total time was spent for this patient encounter, including preparation, face-to-face counseling with the patient and coordination of care, and documentation of the encounter.  Jeral Pinch, MD  Division of Gynecologic Oncology  Department of Obstetrics and Gynecology  [facility]

## 2021-11-07 NOTE — Patient Instructions (Addendum)
It was good to see you today.  I am sorry you have not been feeling well.  I will release your lab test to you in my chart once they are back later today.  I will call you when I get the CT results.  I have also sent in a prescription for Pepcid to the outpatient pharmacy. ? ? ?

## 2021-11-08 ENCOUNTER — Encounter: Payer: Self-pay | Admitting: Gynecologic Oncology

## 2021-11-14 ENCOUNTER — Ambulatory Visit (HOSPITAL_COMMUNITY): Payer: 59

## 2021-11-15 ENCOUNTER — Ambulatory Visit (HOSPITAL_COMMUNITY): Payer: 59

## 2022-01-24 NOTE — Progress Notes (Signed)
Radiation Oncology         ([phone]) [phone] ________________________________  Name: Emily [name] MRN: [id number]  Date: 01/27/2022  DOB: 02-Oct-1960  Follow-Up Visit Note  CC: [name], MD  [name], MD    ICD-10-CM   1. Endometrial cancer ([place])  C54.1       Diagnosis:  Stage IIIA (pT3a, pN0) endometrioid endometrial adenocarcinoma, FIGO grade 2  Interval Since Last Radiation:  1 year, 6 months, and 4 days   HDR brachytherapy dates: 07/09/2020, 07/12/2020, 07/16/2020, 07/19/2020, and 07/26/2020  Narrative:  The patient returns today for routine 6 month follow-up, she was last seen here for follow up on 07/29/22. Since her last visit,  the patient followed up with Dr. Berline Lopes on 11/07/21. During which time, the patient reported 1 to 2 months of fatigue, worsening GERD and subsequent decreased appetite with a 20 pound weight loss.  She struggles with GERD at baseline but noted that it had been worse due to diet changes.  She denied any abdominal pain, pelvic pain, or vaginal bleeding.  She however endorsed some yellow vaginal discharge, and stated that she had not been using the estrogen vaginal cream as frequently (less than once a week) given that it makes the vaginal odor she has worse.  She also noted some urinary symptoms including irritation which she attributed to drinking more Diet Coke. Although the patient was NED on examination, Dr. Berline Lopes noted concern for disease recurrence due to her symptoms, and recommended labs and repeat imaging. CMP and CBC findings were normal. The patient opted to cancel her CT scan given that she attributes her weight loss to GERD, and pepcid seems to help her. (Dr. Berline Lopes had instructed the patient call in if she is not able to gain weight with improvement of GERD).  Otherwise, no significant interval history since the patient was last seen.   On evaluation she continues to feel fatigued but is working her usual schedule as an Therapist, sports.  She  reports requiring more sleep at this time.  She did undergo extensive blood work in early March which did not give any explanation for these issues.  Patient's GERD is better after restarting Pepcid.  She feels this works better than longer acting product such as Nexium.  A CT scan of the abdomen and pelvis was ordered by Dr. Berline Lopes but this has not been scheduled.  She will talk with Dr. Berline Lopes about scheduling an this during her week of vacation in July so as not to interfere with her work.  On evaluation today the patient continues to have a yellowish vaginal discharge.  She reports no vaginal bleeding or strong odor to the discharge.  She denies any hematuria or rectal bleeding.  She denies any pelvic pain or abdominal bloating.                           Allergies:  is allergic to ceftin [cefuroxime], crestor [rosuvastatin calcium], and latex.  Meds: Current Outpatient Medications  Medication Sig Dispense Refill   Cholecalciferol (VITAMIN D) 125 MCG (5000 UT) CAPS Take 5,000 Units by mouth daily.     famotidine (PEPCID) 20 MG tablet Take 1 tablet (20 mg total) by mouth 2 (two) times daily as needed for heartburn or indigestion. 60 tablet 3   pantoprazole (PROTONIX) 40 MG tablet Take 40 mg by mouth daily as needed (acid reduction).     No current facility-administered medications for this encounter.  Physical Findings: The patient is in no acute distress. Patient is alert and oriented.  height is 5' 3.75" (1.619 m) and weight is 124 lb 4 oz (56.4 kg). Her temporal temperature is 96.9 F (36.1 C) (abnormal). Her blood pressure is 117/77 and her pulse is 84. Her respiration is 18 and oxygen saturation is 100%. .    Lungs are clear to auscultation bilaterally. Heart has regular rate and rhythm. No palpable cervical, supraclavicular, or axillary adenopathy. Abdomen soft, non-tender, normal bowel sounds.  On pelvic examination the external genitalia were unremarkable. A speculum exam was  performed.  A mild yellowish vaginal discharge was noted at the vaginal cuff.  This was removed with Fox swab showing no suspicious lesions along the mucosal surface.  On bimanual and rectovaginal examination there are no pelvic masses appreciated.  Vaginal cuff intact.  Rectal sphincter tone normal.   Lab Findings: Lab Results  Component Value Date   WBC 7.7 11/07/2021   HGB 13.8 11/07/2021   HCT 40.8 11/07/2021   MCV 86.6 11/07/2021   PLT 259 11/07/2021    Radiographic Findings: No results found.  Impression: Stage IIIA (pT3a, pN0) endometrioid endometrial adenocarcinoma, FIGO grade 2  No evidence of recurrence on clinical exam today.  Weight loss issues and fatigue as above.  She will pursue her CT scans in early July during her 1 week vacation.  Plan: She will return for routine follow-up in radiation oncology in 6 months.  She will follow-up with Dr. Berline Lopes in 3 months.   25 minutes of total time was spent for this patient encounter, including preparation, face-to-face counseling with the patient and coordination of care, physical exam, and documentation of the encounter. ____________________________________  Blair Promise, PhD, MD  This document serves as a record of services personally performed by Gery Pray, MD. It was created on his behalf by Roney Mans, a trained medical scribe. The creation of this record is based on the scribe's personal observations and the provider's statements to them. This document has been checked and approved by the attending provider.

## 2022-01-27 ENCOUNTER — Ambulatory Visit
Admission: RE | Admit: 2022-01-27 | Discharge: 2022-01-27 | Disposition: A | Discharge status: Home / Self Care | Payer: 59 | Source: Ambulatory Visit | Attending: Radiation Oncology | Admitting: Radiation Oncology

## 2022-01-27 ENCOUNTER — Encounter: Payer: Self-pay | Admitting: Radiation Oncology

## 2022-01-27 ENCOUNTER — Other Ambulatory Visit: Payer: Self-pay

## 2022-01-27 DIAGNOSIS — K219 Gastro-esophageal reflux disease without esophagitis: Secondary | ICD-10-CM | POA: Insufficient documentation

## 2022-01-27 DIAGNOSIS — R5383 Other fatigue: Secondary | ICD-10-CM | POA: Insufficient documentation

## 2022-01-27 DIAGNOSIS — C541 Malignant neoplasm of endometrium: Secondary | ICD-10-CM

## 2022-01-27 DIAGNOSIS — Z8542 Personal history of malignant neoplasm of other parts of uterus: Secondary | ICD-10-CM | POA: Diagnosis not present

## 2022-01-27 DIAGNOSIS — Z923 Personal history of irradiation: Secondary | ICD-10-CM | POA: Diagnosis not present

## 2022-01-27 DIAGNOSIS — R634 Abnormal weight loss: Secondary | ICD-10-CM | POA: Insufficient documentation

## 2022-01-27 NOTE — Progress Notes (Signed)
Emily [name] is here today for follow up post radiation to the pelvic.  They completed their radiation on: 07/26/20  Does the patient complain of any of the following:  Pain:No Abdominal bloating: No Diarrhea/Constipation: No Nausea/Vomiting: No Vaginal Discharge: Yes, yellow discharge, patient states she thinks it is from estrogen.  Blood in Urine or Stool: No Urinary Issues (dysuria/incomplete emptying/ incontinence/ increased frequency/urgency): No Does patient report using vaginal dilator 2-3 times a week and/or sexually active 2-3 weeks: No, patient not using dilators or being sexually active.  Post radiation skin changes: No   Additional comments if applicable:  BP 361/44 (BP Location: Right Arm, Patient Position: Sitting)   Pulse 84   Temp (!) 96.9 F (36.1 C) (Temporal)   Resp 18   Ht 5' 3.75" (1.619 m)   Wt 124 lb 4 oz (56.4 kg)   SpO2 100%   BMI 21.50 kg/m

## 2022-01-29 ENCOUNTER — Telehealth: Payer: Self-pay | Admitting: *Deleted

## 2022-01-29 NOTE — Telephone Encounter (Signed)
Appt made for pt per Emily [name] rad onc rn for 04/11/22 @ 2:15.

## 2022-03-25 ENCOUNTER — Other Ambulatory Visit: Payer: Self-pay | Admitting: Family Medicine

## 2022-03-25 DIAGNOSIS — Z1231 Encounter for screening mammogram for malignant neoplasm of breast: Secondary | ICD-10-CM

## 2022-04-02 DIAGNOSIS — R7303 Prediabetes: Secondary | ICD-10-CM | POA: Diagnosis not present

## 2022-04-02 DIAGNOSIS — E785 Hyperlipidemia, unspecified: Secondary | ICD-10-CM | POA: Diagnosis not present

## 2022-04-02 DIAGNOSIS — K219 Gastro-esophageal reflux disease without esophagitis: Secondary | ICD-10-CM | POA: Diagnosis not present

## 2022-04-02 DIAGNOSIS — Z0000 Encounter for general adult medical examination without abnormal findings: Secondary | ICD-10-CM | POA: Diagnosis not present

## 2022-04-02 DIAGNOSIS — E559 Vitamin D deficiency, unspecified: Secondary | ICD-10-CM | POA: Diagnosis not present

## 2022-04-02 DIAGNOSIS — Z8249 Family history of ischemic heart disease and other diseases of the circulatory system: Secondary | ICD-10-CM | POA: Diagnosis not present

## 2022-04-03 ENCOUNTER — Ambulatory Visit: Payer: 59

## 2022-04-03 ENCOUNTER — Other Ambulatory Visit (HOSPITAL_COMMUNITY): Payer: Self-pay

## 2022-04-03 DIAGNOSIS — E559 Vitamin D deficiency, unspecified: Secondary | ICD-10-CM | POA: Diagnosis not present

## 2022-04-03 DIAGNOSIS — R7303 Prediabetes: Secondary | ICD-10-CM | POA: Diagnosis not present

## 2022-04-03 DIAGNOSIS — Z5181 Encounter for therapeutic drug level monitoring: Secondary | ICD-10-CM | POA: Diagnosis not present

## 2022-04-03 DIAGNOSIS — E785 Hyperlipidemia, unspecified: Secondary | ICD-10-CM | POA: Diagnosis not present

## 2022-04-03 MED ORDER — PANTOPRAZOLE SODIUM 40 MG PO TBEC
40.0000 mg | DELAYED_RELEASE_TABLET | Freq: Every day | ORAL | 3 refills | Status: DC | PRN
  Filled 2022-04-03: qty 90, 90d supply, fill #0

## 2022-04-11 ENCOUNTER — Other Ambulatory Visit (HOSPITAL_COMMUNITY): Payer: Self-pay

## 2022-04-11 ENCOUNTER — Ambulatory Visit: Payer: 59 | Admitting: Gynecologic Oncology

## 2022-04-16 ENCOUNTER — Other Ambulatory Visit (HOSPITAL_COMMUNITY): Payer: Self-pay

## 2022-04-16 ENCOUNTER — Ambulatory Visit
Admission: RE | Admit: 2022-04-16 | Discharge: 2022-04-16 | Disposition: A | Discharge status: Home / Self Care | Payer: 59 | Source: Ambulatory Visit | Attending: Family Medicine | Admitting: Family Medicine

## 2022-04-16 DIAGNOSIS — Z1231 Encounter for screening mammogram for malignant neoplasm of breast: Secondary | ICD-10-CM

## 2022-04-30 ENCOUNTER — Encounter: Payer: Self-pay | Admitting: Gynecologic Oncology

## 2022-05-01 ENCOUNTER — Inpatient Hospital Stay: Payer: 59 | Attending: Gynecologic Oncology | Admitting: Gynecologic Oncology

## 2022-05-01 ENCOUNTER — Other Ambulatory Visit: Payer: Self-pay

## 2022-05-01 ENCOUNTER — Encounter: Payer: Self-pay | Admitting: Gynecologic Oncology

## 2022-05-01 VITALS — BP 101/68 | HR 98 | Temp 97.7°F | Resp 16 | Ht 63.0 in | Wt 137.0 lb

## 2022-05-01 DIAGNOSIS — Z8542 Personal history of malignant neoplasm of other parts of uterus: Secondary | ICD-10-CM | POA: Diagnosis not present

## 2022-05-01 DIAGNOSIS — Z923 Personal history of irradiation: Secondary | ICD-10-CM | POA: Insufficient documentation

## 2022-05-01 DIAGNOSIS — Z9221 Personal history of antineoplastic chemotherapy: Secondary | ICD-10-CM | POA: Insufficient documentation

## 2022-05-01 DIAGNOSIS — K5909 Other constipation: Secondary | ICD-10-CM | POA: Diagnosis not present

## 2022-05-01 DIAGNOSIS — Z90710 Acquired absence of both cervix and uterus: Secondary | ICD-10-CM | POA: Insufficient documentation

## 2022-05-01 DIAGNOSIS — Z90722 Acquired absence of ovaries, bilateral: Secondary | ICD-10-CM | POA: Diagnosis not present

## 2022-05-01 DIAGNOSIS — C541 Malignant neoplasm of endometrium: Secondary | ICD-10-CM

## 2022-05-01 NOTE — Progress Notes (Signed)
Gynecologic Oncology Return Clinic Visit  05/01/2022  Reason for Visit: surveillance visit in the setting of advanced uterine cancer who completed adjuvant treatment in 08/2020  Treatment History: Oncology History Overview Note  IHC MMR normal Endometrioid MSI-stable   Endometrial cancer ([place])  02/28/2020 Initial Diagnosis   The patient presented for an exam on 6/22.  At that time she endorsed episodes of postmenopausal bleeding starting in 09-02-2019 after her mother's death.  She had not had a menses in a number of years.  Patient's exam was limited by her intolerance in the position of the cervix.  Pap test was performed on 6/30 showing atypical glandular cells of undetermined significance. 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 She describes about a year long history previously of clear vaginal discharge. She saw her PCP for this complaint and given what was felt to be urethral swelling, she was treated for a UTI (lab testing did not show an infection). She has not been sexually active since her 89s. She endorses increased urinary frequency. After a year, the clear discharge became yellow (and at one point looked green). The patient was seen again and a pap was attempted in the office but she was unable to tolerate this. She performed a vaginal swab on herself that found yeast. She used Monistat multiple times with improvement in her discharge. She then began to have pale pink discharge. At this time, she was seen by a Urogynecologist at [facility] and it sound like she had urodynamic testing. She describes a raw feeling and burning with her urine. She continues to have vaginal discharge which she notes has an ammonia-like smell. Her vaginal bleeding started again about a month ago. She briefly tried vaginal estrogen after seeing the Urogynecologist but she felt that the bleeding worsened as did the ammonia smell, so she stopped.   03/16/2020  Initial Diagnosis   Endometrial cancer ([place])   03/21/2020 Imaging   1. Abnormally thickened endometrial complex measuring up to 12 mm, presumably related to known history of endometrial carcinoma. 2. Underlying fibroid uterus as detailed above. 3. Normal sonographic evaluation of the ovaries. No abnormal free fluid. Please note that the appendix appears to be potentially located in close proximity to the right ovary on this exam. As this patient is scheduled to undergo total hysterectomy with bilateral salpingo oophorectomy in the near future, close attention to this region at surgical intervention is recommended.     03/27/2020 Pathology Results   A. CUL DE SAC, ANTERIOR, BIOPSY:  -  Benign fibrovascular tissue with mesothelial hyperplasia  -  No carcinoma identified  -  See comment   B. MONS, RIGHT, BIOPSY:  -  Melanocytic nevus, intradermal type  -  See comment   C. LYMPH NODE, SENTINEL, RIGHT EXTERNAL ILIAC, BIOPSY:  -  No carcinoma identified in one lymph node (0/1)  -  See comment   D. PERITONEUM, LEFT SIDEWALL, BIOPSY:  -  Benign fibrovascular tissue with chronic inflammation  -  No carcinoma identified   E. LYMPH NODE, SENTINEL, LEFT EXTERNAL ILIAC, BIOPSY:  -  No carcinoma identified in one lymph node (0/1)  -  See comment   F. UTERUS, CERVIX, BILATERAL FALLOPIAN TUBES AND OVARIES:   Uterus:  -  Endometrioid carcinoma, FIGO grade 2  -  Leiomyomata (1.6 cm ; largest)  -  Carcinoma present in lymphoid aggregate in serosal adhesions  (lymphovascular space invasion)  -  See oncology table and comment  below   Cervix:  -  No carcinoma identified   Bilateral Ovaries:  -  No carcinoma identified   Bilateral Fallopian tubes:  -  Carcinoma within lumen of fallopian tube  (left)   G. PERIURETHRAL, BIOPSY:  -  No carcinoma identified    03/27/2020 Surgery   Robotic-assisted laparoscopic total hysterectomy with bilateral salpingoophorectomy, SLN biopsy   On EUA, very  narrow introitus most due to tight hymenal ring. Small mobile uterus. Hyperpigmented 0.42m plaque on right mons, biopsied. Urethral overall normal in appearance, given hyperemic appearance in clinic, biopsy taken. On intra-abdominal entry, some scarring noted on inferior aspect of the liver, anterior right diaphragm and left lobe of the liver. Omentum normal appearing. Mesentery of the small and large bowel as well as the bowel itself studded with 1-239minflammatory-appearing nodules. Appendix with same studding, otherwise normal in appearance. Uterus 6cm and normal appearing with the exception of inflammatory exudate on posterior aspect of the fundus. Bilateral adnexa normal appearing. Chocolate brown staining within most of the cul-de-sac. Inflammatory appearing nodules studding bilateral pelvic walls, anterior and posterior cul-de-sac c/w endometriosis. No adenopathy. No intra-abdominal or pelvic evidence of disease.   03/2020 Initial Biopsy   EMB - gr 1-2 EMCA   03/27/2020 Cancer Staging   Staging form: Corpus Uteri - Carcinoma and Carcinosarcoma, AJCC 8th Edition - Clinical stage from 03/27/2020: FIGO Stage IIIA (cT3a, cN0(sn), cM0) - Signed by GoHeath LarkMD on 04/24/2020   04/23/2020 Imaging   Status post hysterectomy and bilateral salpingo-oophorectomy.   Mild stranding along the anterior transverse mesocolon. No frank peritoneal nodularity or omental caking. Attention on follow-up is suggested.   No findings specific for recurrent or metastatic disease.   Bladder is mildly thick-walled although underdistended.     04/27/2020 - 08/17/2020 Chemotherapy   The patient had carboplatin and taxol for chemotherapy treatment.     07/09/2020 - 07/26/2020 Radiation Therapy   VBT: 30 Gy, 5 fractions, HDR   10/04/2020 Imaging   Resolution of omental soft tissue stranding since prior study. No evidence of recurrent or metastatic carcinoma within the abdomen or pelvis     Interval History: She saw  Dr. KiSondra Comen 5/22.  At that time patient endorsed fatigue.  GERD symptoms were improved after restarting Pepcid.  Reports feeling much better than she was last time I saw her.  Struggling some with constipation and gas.  Is using MiraLAX as needed with some relief.  Denies any abdominal or pelvic pain.  GERD symptoms are under much better control with Pepcid.  She is also being careful not to lay in bed around the time of eating.  She continues to have some fatigue although improved.  Endorses a good appetite without nausea or emesis.  Has not been using her estrogen as much, vaginal discharge is unchanged.  Denies any vaginal bleeding.  Past Medical/Surgical History: Past Medical History:  Diagnosis Date   Abnormal EKG 1111-13-16 Anxiety and depression    pt denies 7/16/   Bruises easily    Chest pain    Chest pain with moderate risk of acute coronary syndrome    Chest tightness    Difficult intubation    throat feels small pt has acid reflux ? scars due to acid    Elevated cholesterol    Endometrial cancer (HCC)    Family history of premature CAD 1111-13-16 Father had MI 4079'sdied at 5030f an MI    Fatigue  Fibroid    Fx sacrum/coccyx-closed ([place])    Gastritis    GERD (gastroesophageal reflux disease)    History of radiation therapy 07/09/2020-07/26/2020   vaginal HDR brachytherapy    Dr Gery Pray   Hyperlipidemia 07/16/2015   Osteopenia    Pigmented skin lesions    Pre-diabetes    Sinus problem    SOB (shortness of breath) on exertion    Urethra disorder    currently swollen per pt    Vitamin D deficiency    Weight gain     Past Surgical History:  Procedure Laterality Date   ADENOIDECTOMY     COLONOSCOPY     ROBOTIC ASSISTED TOTAL HYSTERECTOMY WITH BILATERAL SALPINGO OOPHERECTOMY Bilateral 03/27/2020   Procedure: XI ROBOTIC ASSISTED TOTAL HYSTERECTOMY WITH BILATERAL SALPINGO OOPHORECTOMY;  Surgeon: [name], MD;  Location: [facility];  Service: Gynecology;   Laterality: Bilateral;   SENTINEL NODE BIOPSY N/A 03/27/2020   Procedure: SENTINEL NODE BIOPSY, URETHRAL BIOPSY AND VAGINAL CULTURE;  Surgeon: [name], MD;  Location: [facility];  Service: Gynecology;  Laterality: N/A;   SMALL BOWEL ENTEROSCOPY     TONSILLECTOMY      Family History  Problem Relation Age of Onset   Atrial fibrillation Mother    Hypertension Mother    Stroke Mother    Arthritis Mother    Atrial fibrillation Father    Heart attack Father 16   Hypertension Father    COPD Father    Diabetes Father    Colon cancer Paternal Grandmother    Breast cancer Neg Hx     Social History   Socioeconomic History   Marital status: Single    Spouse name: Not on file   Number of children: 0   Years of education: 16   Highest education level: Not on file  Occupational History   Occupation: Therapist, sports  Tobacco Use   Smoking status: Never   Smokeless tobacco: Never  Vaping Use   Vaping Use: Never used  Substance and Sexual Activity   Alcohol use: Never   Drug use: No   Sexual activity: Not Currently  Other Topics Concern   Not on file  Social History Narrative   Not on file   Social Determinants of Health   Financial Resource Strain: Not on file  Food Insecurity: Not on file  Transportation Needs: Not on file  Physical Activity: Not on file  Stress: Not on file  Social Connections: Not on file    Current Medications:  Current Outpatient Medications:    Cholecalciferol (VITAMIN D) 125 MCG (5000 UT) CAPS, Take 5,000 Units by mouth daily., Disp: , Rfl:    polyethylene glycol (MIRALAX / GLYCOLAX) 17 g packet, Take 17 g by mouth daily., Disp: , Rfl:    pantoprazole (PROTONIX) 40 MG tablet, Take 40 mg by mouth daily as needed (acid reduction). (Patient not taking: Reported on 04/30/2022), Disp: , Rfl:   Review of Systems: + constipation, bloating Denies appetite changes, fevers, chills, fatigue, unexplained weight changes. Denies hearing loss, neck lumps or masses,  mouth sores, ringing in ears or voice changes. Denies cough or wheezing.  Denies shortness of breath. Denies chest pain or palpitations. Denies leg swelling. Denies abdominal pain, blood in stools, diarrhea, nausea, vomiting, or early satiety. Denies pain with intercourse, dysuria, frequency, hematuria or incontinence. Denies hot flashes, pelvic pain, vaginal bleeding or vaginal discharge.   Denies joint pain, back pain or muscle pain/cramps. Denies itching, rash, or wounds. Denies dizziness, headaches,  numbness or seizures. Denies swollen lymph nodes or glands, denies easy bruising or bleeding. Denies anxiety, depression, confusion, or decreased concentration.  Physical Exam: BP 101/68 (BP Location: Left Arm, Patient Position: Sitting)   Pulse 98   Temp 97.7 F (36.5 C) (Oral)   Resp 16   Ht 5' 3" (1.6 m)   Wt 137 lb (62.1 kg)   SpO2 98%   BMI 24.27 kg/m  General: Alert, oriented, no acute distress. HEENT: Normocephalic, atraumatic sclera anicteric. Chest: Clear to auscultation bilaterally.  No wheezes or rhonchi. Cardiovascular: Regular rate and rhythm, no murmurs. Abdomen: soft, nontender.  Normoactive bowel sounds.  No masses or hepatosplenomegaly appreciated.  Well-healed incisions. Extremities: Grossly normal range of motion.  Warm, well perfused.  No edema bilaterally. Skin: No rashes or lesions noted. Lymphatics: No cervical, supraclavicular, or inguinal adenopathy. GU: Normal appearing external genitalia without erythema, excoriation, or lesions.  Speculum exam reveals moderately atrophic vaginal mucosa, no discharge or bleeding noted.  No masses.  Bimanual exam reveals no masses or nodularity.  Rectovaginal exam confirms these findings.  Laboratory & Radiologic Studies: None new  Assessment & Plan: Emily [name] is a 60 y.o. 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  Completed adjuvant chemotherapy and  vaginal brachytherapy in 08/2020. MSI stable.   Patient is overall doing well and is NED on exam today.  She continues to have some symptoms although is feeling much better than she was when I saw her 6 months ago.  She is also gained about 10 pounds in the interim and her GERD symptoms are much better controlled.  I voiced my concern about her continued constipation and gas.  She is wanting to delay colonoscopy evaluation at this point.  I made recommendation for CT scan, she would like to hold off on this now.  I stressed the importance of calling if she does not notice continued improvement or if she has any worsening of her symptoms.  From a cancer standpoint, we will continue with surveillance visits every 3 months until 2-3 years out from completion of adjuvant therapy at which point we will transition to visits every 6 months.  We reviewed signs and symptoms that would be concerning for cancer recurrence and I stressed the importance of calling if she develops any of these between visits.  She will see Dr. Sondra Come in November and I have asked her to call to schedule a visit with me after the new year in February.  22 minutes of total time was spent for this patient encounter, including preparation, face-to-face counseling with the patient and coordination of care, and documentation of the encounter.  Jeral Pinch, MD  Division of Gynecologic Oncology  Department of Obstetrics and Gynecology  [facility]

## 2022-05-01 NOTE — Patient Instructions (Addendum)
It was good to see you today.  I do not see or feel any evidence of cancer on your exam.  Please let me know if you do not have continued improvement with your constipation or if you develop any new symptoms.  Your next appointment with Dr. Sondra Come is in November.  I will see you in February.  Please call back sometime in December or after the new year to schedule this visit.  As always, please call if you develop any new and concerning symptoms between visits.

## 2022-07-16 ENCOUNTER — Other Ambulatory Visit (HOSPITAL_COMMUNITY): Payer: Self-pay

## 2022-07-16 DIAGNOSIS — M26609 Unspecified temporomandibular joint disorder, unspecified side: Secondary | ICD-10-CM | POA: Diagnosis not present

## 2022-07-16 DIAGNOSIS — R52 Pain, unspecified: Secondary | ICD-10-CM | POA: Diagnosis not present

## 2022-07-16 DIAGNOSIS — R591 Generalized enlarged lymph nodes: Secondary | ICD-10-CM | POA: Diagnosis not present

## 2022-07-16 DIAGNOSIS — H6591 Unspecified nonsuppurative otitis media, right ear: Secondary | ICD-10-CM | POA: Diagnosis not present

## 2022-07-16 MED ORDER — NAPROXEN 250 MG PO TABS
250.0000 mg | ORAL_TABLET | Freq: Two times a day (BID) | ORAL | 0 refills | Status: DC | PRN
  Filled 2022-07-16: qty 60, 30d supply, fill #0

## 2022-07-16 MED ORDER — FLUTICASONE PROPIONATE 50 MCG/ACT NA SUSP
1.0000 | Freq: Every day | NASAL | 5 refills | Status: DC
  Filled 2022-07-16: qty 16, 60d supply, fill #0

## 2022-07-17 ENCOUNTER — Other Ambulatory Visit (HOSPITAL_COMMUNITY): Payer: Self-pay

## 2022-08-07 ENCOUNTER — Ambulatory Visit
Admission: RE | Admit: 2022-08-07 | Discharge: 2022-08-07 | Disposition: A | Discharge status: Home / Self Care | Payer: 59 | Source: Ambulatory Visit | Attending: Radiation Oncology | Admitting: Radiation Oncology

## 2022-08-07 ENCOUNTER — Encounter: Payer: Self-pay | Admitting: Radiation Oncology

## 2022-08-07 VITALS — BP 110/74 | HR 86 | Temp 97.9°F | Resp 18 | Ht 63.0 in | Wt 148.4 lb

## 2022-08-07 DIAGNOSIS — R350 Frequency of micturition: Secondary | ICD-10-CM

## 2022-08-07 DIAGNOSIS — C541 Malignant neoplasm of endometrium: Secondary | ICD-10-CM | POA: Diagnosis not present

## 2022-08-07 DIAGNOSIS — Z8542 Personal history of malignant neoplasm of other parts of uterus: Secondary | ICD-10-CM | POA: Insufficient documentation

## 2022-08-07 LAB — URINALYSIS, COMPLETE (UACMP) WITH MICROSCOPIC
Bilirubin Urine: NEGATIVE
Glucose, UA: NEGATIVE mg/dL
Ketones, ur: NEGATIVE mg/dL
Leukocytes,Ua: NEGATIVE
Nitrite: NEGATIVE
Protein, ur: NEGATIVE mg/dL
Specific Gravity, Urine: 1.009 (ref 1.005–1.030)
pH: 6 (ref 5.0–8.0)

## 2022-08-07 NOTE — Progress Notes (Signed)
Radiation Oncology         [phone] ________________________________  Name: Emily [name] MRN: [id number]  Date: 08/07/2022  DOB: 04-07-1961  Follow-Up Visit Note  CC: Saintclair Halsted, FNP  [name], MD    ICD-10-CM   1. Endometrial cancer ([place])  C54.1     2. Frequency of urination  R35.0 Urinalysis, Complete w Microscopic    Urine culture      Diagnosis: Stage IIIA (pT3a, pN0) endometrioid endometrial adenocarcinoma, FIGO grade 2   Interval Since Last Radiation: 2 years and 12 days   HDR brachytherapy dates: 07/09/2020, 07/12/2020, 07/16/2020, 07/19/2020, and 07/26/2020   Narrative:  The patient returns today for routine 6 month follow-up, she was last seen here for follow-up on 01/27/22. Since her last visit, the patient followed up with Dr. Berline Lopes on 05/01/22. During which time, the patient reported improvement in her fatigue and improvement in her GERD symptoms since started pepcid (endorsed both during our last visit). She however endorsed continued constipation and gas, for which Dr. Berline Lopes advised a CT scan. However, the patient opted against further workup. Otherwise, the patient denied any concerning symptoms and was noted as NED on exam.                 Pertinent imaging performed in the interval includes a bilateral screening mammogram on 04/16/22 which showed no evidence of malignancy in either breast.       On evaluation today patient continues to have some problems with abdominal bloating.  We discussed proceeding with CT scan as recommended by Dr. Berline Lopes but she wishes to hold off on the study.  She has had problems with TMJ pain and recently saw her dentist concerning this issue.  She has had some pain along the right face and is concerned about some swelling in this area related to TMJ.  She denies any pain within the pelvis area.  She occasionally will have mild yellowish vaginal discharge.  She denies any vaginal bleeding or rectal bleeding.  She  has had a strong odor to her urine.  She will be sent to the lab for urinalysis culture and sensitivity to evaluate this issue further.  She uses her vaginal dilator approximately once a week.  She denies any blood with use of this equipment.  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  Meds: Current Outpatient Medications  Medication Sig Dispense Refill   Cholecalciferol (VITAMIN D) 125 MCG (5000 UT) CAPS Take 5,000 Units by mouth daily.     fluticasone (FLONASE) 50 MCG/ACT nasal spray Place 1 spray into both nostrils daily. 16 g 5   naproxen (NAPROSYN) 250 MG tablet Take 1 tablet (250 mg total) by mouth 2 (two) times daily as needed with food or milk 60 tablet 0   pantoprazole (PROTONIX) 40 MG tablet Take 40 mg by mouth daily as needed (acid reduction). (Patient not taking: Reported on 04/30/2022)     No current facility-administered medications for this encounter.    Physical Findings: The patient is in no acute distress. Patient is alert and oriented.  height is '5\' 3"'$  (1.6 m) and weight is 148 lb 6.4 oz (67.3 kg). Her temperature is 97.9 F (36.6 C). Her blood pressure is 110/74 and her pulse is 86. Her respiration is 18 and oxygen saturation is 100%. .   Lungs are  clear to auscultation bilaterally. Heart has regular rate and rhythm. No palpable cervical, supraclavicular, or axillary adenopathy. Abdomen soft, non-tender, normal bowel sounds.  Examination of the face and neck area reveals no suspicious adenopathy.  Mild swelling noted along the region of the right temporomandibular joint.  The oral cavity is free of secondary infection.  Mucosa somewhat dry.  On pelvic examination the external genitalia were unremarkable.  A erythematous lesion is noted along the perineum.  This measures approximately centimeter in greatest dimension.  A speculum exam was  performed. There are no mucosal lesions noted in the vaginal vault.  Very mild radiation changes noted at the vaginal cuff.  On bimanual and rectovaginal examination there were no pelvic masses appreciated.  Vaginal cuff intact.  Rectal sphincter tone normal.    Lab Findings: Lab Results  Component Value Date   WBC 7.7 11/07/2021   HGB 13.8 11/07/2021   HCT 40.8 11/07/2021   MCV 86.6 11/07/2021   PLT 259 11/07/2021    Radiographic Findings: No results found.  Impression:  Stage IIIA (pT3a, pN0) endometrioid endometrial adenocarcinoma, FIGO grade 2   No evidence of recurrence on clinical exam today.  Recommend she get hydrocortisone to the erythematous lesion along the perineum which she admits is mildly pruritic. If This does not clear recommended she follow-up with Dr. Berline Lopes or dermatologist concerning this issue.  Plan: Routine follow-up in 6 months.  She will follow-up with Dr. Berline Lopes in 3 months.   25 minutes of total time was spent for this patient encounter, including preparation, face-to-face counseling with the patient and coordination of care, physical exam, and documentation of the encounter. ____________________________________  Blair Promise, PhD, MD  This document serves as a record of services personally performed by Gery Pray, MD. It was created on his behalf by Roney Mans, a trained medical scribe. The creation of this record is based on the scribe's personal observations and the provider's statements to them. This document has been checked and approved by the attending provider.

## 2022-08-07 NOTE — Progress Notes (Signed)
Emily [name] is here today for follow up post radiation to the pelvic.  They completed their radiation on: 07/26/20  Does the patient complain of any of the following:  Pain:No Abdominal bloating: Yes, patient reports increased gas. Diarrhea/Constipation: Constipation Nausea/Vomiting: No Vaginal Discharge: Yes, Yellow drainage with odor.  Blood in Urine or Stool: No Urinary Issues (dysuria/incomplete emptying/ incontinence/ increased frequency/urgency): Yes, urinary frequency.  Does patient report using vaginal dilator 2-3 times a week and/or sexually active 2-3 weeks: Yes, using dilators at times.  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 Patient concerned about possible lymph node involvement. Patient was seen at the dentist today.    BP 110/74 (BP Location: Right Arm, Patient Position: Sitting, Cuff Size: Normal)   Pulse 86   Temp 97.9 F (36.6 C)   Resp 18   Ht '5\' 3"'$  (1.6 m)   Wt 148 lb 6.4 oz (67.3 kg)   SpO2 100%   BMI 26.29 kg/m

## 2022-08-08 LAB — URINE CULTURE: Culture: NO GROWTH

## 2022-09-15 ENCOUNTER — Telehealth: Payer: Self-pay | Admitting: *Deleted

## 2022-09-15 NOTE — Telephone Encounter (Signed)
Enid Derry from radiation called and scheduled the patient for a follow up appt with  Dr Berline Lopes on 2/16 at 4:15 pm.  Appt scheduled and Enid Derry will contact the patient with the appt date/time.

## 2022-09-15 NOTE — Telephone Encounter (Signed)
Called patient to inform of fu appt. with Dr. Berline Lopes on 10-24-22 - arrival time- 3:45 pm, lvm for a return call

## 2022-09-25 ENCOUNTER — Telehealth: Payer: Self-pay | Admitting: *Deleted

## 2022-09-25 NOTE — Telephone Encounter (Signed)
Returned patient's phone call, lvm for a return call 

## 2022-09-26 ENCOUNTER — Telehealth: Payer: Self-pay

## 2022-09-26 ENCOUNTER — Telehealth: Payer: Self-pay | Admitting: *Deleted

## 2022-09-26 NOTE — Telephone Encounter (Signed)
LM in Shirley's vm that Ms Tolliver' appointment was r/s to 12-04-22 at 0845 as this is the first available Thursday appointment.  Dr. Berline Lopes is in [facility] every other week.

## 2022-09-26 NOTE — Telephone Encounter (Signed)
CALLED PATIENT TO INFORM OF APPT. WITH DR. Berline [name] BEING MOVED TO 12-04-22- ARRIVAL TIME- 8:30 AM @ [facility], LVM FOR A RETURN CALL

## 2022-10-24 ENCOUNTER — Ambulatory Visit: Payer: Commercial Managed Care - PPO | Admitting: Gynecologic Oncology

## 2022-11-06 ENCOUNTER — Ambulatory Visit: Payer: Commercial Managed Care - PPO | Admitting: Sports Medicine

## 2022-11-14 ENCOUNTER — Other Ambulatory Visit (HOSPITAL_COMMUNITY): Payer: Self-pay

## 2022-11-14 ENCOUNTER — Other Ambulatory Visit: Payer: Self-pay | Admitting: Gynecologic Oncology

## 2022-11-14 DIAGNOSIS — C541 Malignant neoplasm of endometrium: Secondary | ICD-10-CM

## 2022-11-17 ENCOUNTER — Other Ambulatory Visit (HOSPITAL_COMMUNITY): Payer: Self-pay

## 2022-12-03 NOTE — Progress Notes (Unsigned)
Gynecologic Oncology Return Clinic Visit  12/03/22  Reason for Visit: surveillance visit in the setting of advanced uterine cancer who completed adjuvant treatment in 08/2020   Treatment History: Oncology History Overview Note  IHC MMR normal Endometrioid MSI-stable   Endometrial cancer ([place])  02/28/2020 Initial Diagnosis   The patient presented for an exam on 6/22.  At that time she endorsed episodes of postmenopausal bleeding starting in 2019/08/29 after her mother's death.  She had not had a menses in a number of years.  Patient's exam was limited by her intolerance in the position of the cervix.  Pap test was performed on 6/30 showing atypical glandular cells of undetermined significance. 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 She describes about a year long history previously of clear vaginal discharge. She saw her PCP for this complaint and given what was felt to be urethral swelling, she was treated for a UTI (lab testing did not show an infection). She has not been sexually active since her 17s. She endorses increased urinary frequency. After a year, the clear discharge became yellow (and at one point looked green). The patient was seen again and a pap was attempted in the office but she was unable to tolerate this. She performed a vaginal swab on herself that found yeast. She used Monistat multiple times with improvement in her discharge. She then began to have pale pink discharge. At this time, she was seen by a Urogynecologist at [facility] and it sound like she had urodynamic testing. She describes a raw feeling and burning with her urine. She continues to have vaginal discharge which she notes has an ammonia-like smell. Her vaginal bleeding started again about a month ago. She briefly tried vaginal estrogen after seeing the Urogynecologist but she felt that the bleeding worsened as did the ammonia smell, so she stopped.   03/16/2020  Initial Diagnosis   Endometrial cancer ([place])   03/21/2020 Imaging   1. Abnormally thickened endometrial complex measuring up to 12 mm, presumably related to known history of endometrial carcinoma. 2. Underlying fibroid uterus as detailed above. 3. Normal sonographic evaluation of the ovaries. No abnormal free fluid. Please note that the appendix appears to be potentially located in close proximity to the right ovary on this exam. As this patient is scheduled to undergo total hysterectomy with bilateral salpingo oophorectomy in the near future, close attention to this region at surgical intervention is recommended.     03/27/2020 Pathology Results   A. CUL DE SAC, ANTERIOR, BIOPSY:  -  Benign fibrovascular tissue with mesothelial hyperplasia  -  No carcinoma identified  -  See comment   B. MONS, RIGHT, BIOPSY:  -  Melanocytic nevus, intradermal type  -  See comment   C. LYMPH NODE, SENTINEL, RIGHT EXTERNAL ILIAC, BIOPSY:  -  No carcinoma identified in one lymph node (0/1)  -  See comment   D. PERITONEUM, LEFT SIDEWALL, BIOPSY:  -  Benign fibrovascular tissue with chronic inflammation  -  No carcinoma identified   E. LYMPH NODE, SENTINEL, LEFT EXTERNAL ILIAC, BIOPSY:  -  No carcinoma identified in one lymph node (0/1)  -  See comment   F. UTERUS, CERVIX, BILATERAL FALLOPIAN TUBES AND OVARIES:   Uterus:  -  Endometrioid carcinoma, FIGO grade 2  -  Leiomyomata (1.6 cm ; largest)  -  Carcinoma present in lymphoid aggregate in serosal adhesions  (lymphovascular space invasion)  -  See oncology table and  comment below   Cervix:  -  No carcinoma identified   Bilateral Ovaries:  -  No carcinoma identified   Bilateral Fallopian tubes:  -  Carcinoma within lumen of fallopian tube  (left)   G. PERIURETHRAL, BIOPSY:  -  No carcinoma identified    03/27/2020 Surgery   Robotic-assisted laparoscopic total hysterectomy with bilateral salpingoophorectomy, SLN biopsy   On EUA, very  narrow introitus most due to tight hymenal ring. Small mobile uterus. Hyperpigmented 0.5mm plaque on right mons, biopsied. Urethral overall normal in appearance, given hyperemic appearance in clinic, biopsy taken. On intra-abdominal entry, some scarring noted on inferior aspect of the liver, anterior right diaphragm and left lobe of the liver. Omentum normal appearing. Mesentery of the small and large bowel as well as the bowel itself studded with 1-46mm inflammatory-appearing nodules. Appendix with same studding, otherwise normal in appearance. Uterus 6cm and normal appearing with the exception of inflammatory exudate on posterior aspect of the fundus. Bilateral adnexa normal appearing. Chocolate brown staining within most of the cul-de-sac. Inflammatory appearing nodules studding bilateral pelvic walls, anterior and posterior cul-de-sac c/w endometriosis. No adenopathy. No intra-abdominal or pelvic evidence of disease.   03/2020 Initial Biopsy   EMB - gr 1-2 EMCA   03/27/2020 Cancer Staging   Staging form: Corpus Uteri - Carcinoma and Carcinosarcoma, AJCC 8th Edition - Clinical stage from 03/27/2020: FIGO Stage IIIA (cT3a, cN0(sn), cM0) - Signed by [name], MD on 04/24/2020   04/23/2020 Imaging   Status post hysterectomy and bilateral salpingo-oophorectomy.   Mild stranding along the anterior transverse mesocolon. No frank peritoneal nodularity or omental caking. Attention on follow-up is suggested.   No findings specific for recurrent or metastatic disease.   Bladder is mildly thick-walled although underdistended.     04/27/2020 - 08/17/2020 Chemotherapy   The patient had carboplatin and taxol for chemotherapy treatment.     07/09/2020 - 07/26/2020 Radiation Therapy   VBT: 30 Gy, 5 fractions, HDR   10/04/2020 Imaging   Resolution of omental soft tissue stranding since prior study. No evidence of recurrent or metastatic carcinoma within the abdomen or pelvis     Interval  History: ***  Past Medical/Surgical History: Past Medical History:  Diagnosis Date   Abnormal EKG 07-28-2015   Anxiety and depression    pt denies 7/16/   Bruises easily    Chest pain    Chest pain with moderate risk of acute coronary syndrome    Chest tightness    Difficult intubation    throat feels small pt has acid reflux ? scars due to acid    Elevated cholesterol    Endometrial cancer (HCC)    Family history of premature CAD 07/28/15   Father had MI 93's, died at 29 of an MI    Fatigue    Fibroid    Fx sacrum/coccyx-closed ([place])    Gastritis    GERD (gastroesophageal reflux disease)    History of radiation therapy 07/09/2020-07/26/2020   vaginal HDR brachytherapy    Dr Gery Pray   Hyperlipidemia Jul 28, 2015   Osteopenia    Pigmented skin lesions    Pre-diabetes    Sinus problem    SOB (shortness of breath) on exertion    Urethra disorder    currently swollen per pt    Vitamin D deficiency    Weight gain     Past Surgical History:  Procedure Laterality Date   ADENOIDECTOMY     COLONOSCOPY     ROBOTIC ASSISTED TOTAL  HYSTERECTOMY WITH BILATERAL SALPINGO OOPHERECTOMY Bilateral 03/27/2020   Procedure: XI ROBOTIC ASSISTED TOTAL HYSTERECTOMY WITH BILATERAL SALPINGO OOPHORECTOMY;  Surgeon: [name], MD;  Location: [facility];  Service: Gynecology;  Laterality: Bilateral;   SENTINEL NODE BIOPSY N/A 03/27/2020   Procedure: SENTINEL NODE BIOPSY, URETHRAL BIOPSY AND VAGINAL CULTURE;  Surgeon: [name], MD;  Location: [facility];  Service: Gynecology;  Laterality: N/A;   SMALL BOWEL ENTEROSCOPY     TONSILLECTOMY      Family History  Problem Relation Age of Onset   Atrial fibrillation Mother    Hypertension Mother    Stroke Mother    Arthritis Mother    Atrial fibrillation Father    Heart attack Father 24   Hypertension Father    COPD Father    Diabetes Father    Colon cancer Paternal Grandmother    Breast cancer Neg Hx     Social History    Socioeconomic History   Marital status: Single    Spouse name: Not on file   Number of children: 0   Years of education: 16   Highest education level: Not on file  Occupational History   Occupation: Therapist, sports  Tobacco Use   Smoking status: Never   Smokeless tobacco: Never  Vaping Use   Vaping Use: Never used  Substance and Sexual Activity   Alcohol use: Never   Drug use: No   Sexual activity: Not Currently  Other Topics Concern   Not on file  Social History Narrative   Not on file   Social Determinants of Health   Financial Resource Strain: Not on file  Food Insecurity: Not on file  Transportation Needs: Not on file  Physical Activity: Not on file  Stress: Not on file  Social Connections: Not on file    Current Medications:  Current Outpatient Medications:    Cholecalciferol (VITAMIN D) 125 MCG (5000 UT) CAPS, Take 5,000 Units by mouth daily., Disp: , Rfl:   Review of Systems: Denies appetite changes, fevers, chills, fatigue, unexplained weight changes. Denies hearing loss, neck lumps or masses, mouth sores, ringing in ears or voice changes. Denies cough or wheezing.  Denies shortness of breath. Denies chest pain or palpitations. Denies leg swelling. Denies abdominal distention, pain, blood in stools, constipation, diarrhea, nausea, vomiting, or early satiety. Denies pain with intercourse, dysuria, frequency, hematuria or incontinence. Denies hot flashes, pelvic pain, vaginal bleeding or vaginal discharge.   Denies joint pain, back pain or muscle pain/cramps. Denies itching, rash, or wounds. Denies dizziness, headaches, numbness or seizures. Denies swollen lymph nodes or glands, denies easy bruising or bleeding. Denies anxiety, depression, confusion, or decreased concentration.  Physical Exam: There were no vitals taken for this visit. General: ***Alert, oriented, no acute distress. HEENT: ***Posterior oropharynx clear, sclera anicteric. Chest: ***Clear to  auscultation bilaterally.  ***Port site clean. Cardiovascular: ***Regular rate and rhythm, no murmurs. Abdomen: ***Obese, soft, nontender.  Normoactive bowel sounds.  No masses or hepatosplenomegaly appreciated.  ***Well-healed scar. Extremities: ***Grossly normal range of motion.  Warm, well perfused.  No edema bilaterally. Skin: ***No rashes or lesions noted. Lymphatics: ***No cervical, supraclavicular, or inguinal adenopathy. GU: Normal appearing external genitalia without erythema, excoriation, or lesions.  Speculum exam reveals ***.  Bimanual exam reveals ***.  ***Rectovaginal exam  confirms ___.  Laboratory & Radiologic Studies: ***  Assessment & Plan: Emily [name] is a 62 y.o. 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  Completed adjuvant chemotherapy and vaginal brachytherapy in 08/2020. MSI stable.   Patient is overall doing well and is NED on exam today.  She continues to have some symptoms although is feeling much better than she was when I saw her 6 months ago.  She is also gained about 10 pounds in the interim and her GERD symptoms are much better controlled.  I voiced my concern about her continued constipation and gas.  She is wanting to delay colonoscopy evaluation at this point.  I made recommendation for CT scan, she would like to hold off on this now.  I stressed the importance of calling if she does not notice continued improvement or if she has any worsening of her symptoms.   From a cancer standpoint, we will continue with surveillance visits every 3 months until 2-3 years out from completion of adjuvant therapy at which point we will transition to visits every 6 months.  We reviewed signs and symptoms that would be concerning for cancer recurrence and I stressed the importance of calling if she develops any of these between visits.  She will see Dr. Sondra Come in November and I have asked her to call  to schedule a visit with me after the new year in February.  *** minutes of total time was spent for this patient encounter, including preparation, face-to-face counseling with the patient and coordination of care, and documentation of the encounter.  Jeral Pinch, MD  Division of Gynecologic Oncology  Department of Obstetrics and Gynecology  [facility]

## 2022-12-04 ENCOUNTER — Inpatient Hospital Stay: Payer: Commercial Managed Care - PPO | Attending: Gynecologic Oncology | Admitting: Gynecologic Oncology

## 2022-12-04 ENCOUNTER — Other Ambulatory Visit (HOSPITAL_COMMUNITY): Payer: Self-pay

## 2022-12-04 VITALS — BP 130/78 | HR 77 | Resp 18 | Ht 63.0 in | Wt 154.0 lb

## 2022-12-04 DIAGNOSIS — Z90710 Acquired absence of both cervix and uterus: Secondary | ICD-10-CM | POA: Diagnosis not present

## 2022-12-04 DIAGNOSIS — Z9079 Acquired absence of other genital organ(s): Secondary | ICD-10-CM | POA: Diagnosis not present

## 2022-12-04 DIAGNOSIS — Z90722 Acquired absence of ovaries, bilateral: Secondary | ICD-10-CM | POA: Diagnosis not present

## 2022-12-04 DIAGNOSIS — Z9221 Personal history of antineoplastic chemotherapy: Secondary | ICD-10-CM | POA: Insufficient documentation

## 2022-12-04 DIAGNOSIS — Z923 Personal history of irradiation: Secondary | ICD-10-CM | POA: Diagnosis not present

## 2022-12-04 DIAGNOSIS — K219 Gastro-esophageal reflux disease without esophagitis: Secondary | ICD-10-CM

## 2022-12-04 DIAGNOSIS — Z8542 Personal history of malignant neoplasm of other parts of uterus: Secondary | ICD-10-CM | POA: Insufficient documentation

## 2022-12-04 DIAGNOSIS — C541 Malignant neoplasm of endometrium: Secondary | ICD-10-CM

## 2022-12-04 MED ORDER — FAMOTIDINE 20 MG PO TABS
20.0000 mg | ORAL_TABLET | Freq: Every day | ORAL | 2 refills | Status: DC | PRN
  Filled 2022-12-04 – 2022-12-22 (×2): qty 30, 30d supply, fill #0

## 2022-12-04 NOTE — Patient Instructions (Addendum)
It was good to see you today.  I do not see or feel any evidence of cancer recurrence on your exam.  We will see you for follow-up in 6 months. Please call [phone] sometime after June (or after your follow-up with Dr. Sondra Come) to schedule a visit to see Dr. Berline Lopes in September or October.  As always, if you develop any new and concerning symptoms before your next visit, please call to see me sooner.  Symptoms to report to your health care team include vaginal bleeding, rectal bleeding, bloating, weight loss without effort, new and persistent pain, new and  persistent fatigue, new leg swelling, new masses (i.e., bumps in your neck or groin), new and persistent cough, new and persistent nausea and vomiting, change in bowel or bladder habits, and any other concerns.

## 2022-12-04 NOTE — Progress Notes (Signed)
Gynecologic Oncology Return Clinic Visit  12/04/2022  Reason for Visit: surveillance visit in the setting of advanced uterine cancer who completed adjuvant treatment in 08/2020  Treatment History: Oncology History Overview Note  IHC MMR normal Endometrioid MSI-stable   Endometrial cancer ([place])  02/28/2020 Initial Diagnosis   The patient presented for an exam on 6/22.  At that time she endorsed episodes of postmenopausal bleeding starting in 14-Aug-2019 after her mother's death.  She had not had a menses in a number of years.  Patient's exam was limited by her intolerance in the position of the cervix.  Pap test was performed on 6/30 showing atypical glandular cells of undetermined significance. 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 She describes about a year long history previously of clear vaginal discharge. She saw her PCP for this complaint and given what was felt to be urethral swelling, she was treated for a UTI (lab testing did not show an infection). She has not been sexually active since her 41s. She endorses increased urinary frequency. After a year, the clear discharge became yellow (and at one point looked green). The patient was seen again and a pap was attempted in the office but she was unable to tolerate this. She performed a vaginal swab on herself that found yeast. She used Monistat multiple times with improvement in her discharge. She then began to have pale pink discharge. At this time, she was seen by a Urogynecologist at [facility] and it sound like she had urodynamic testing. She describes a raw feeling and burning with her urine. She continues to have vaginal discharge which she notes has an ammonia-like smell. Her vaginal bleeding started again about a month ago. She briefly tried vaginal estrogen after seeing the Urogynecologist but she felt that the bleeding worsened as did the ammonia smell, so she stopped.   03/16/2020  Initial Diagnosis   Endometrial cancer ([place])   03/21/2020 Imaging   1. Abnormally thickened endometrial complex measuring up to 12 mm, presumably related to known history of endometrial carcinoma. 2. Underlying fibroid uterus as detailed above. 3. Normal sonographic evaluation of the ovaries. No abnormal free fluid. Please note that the appendix appears to be potentially located in close proximity to the right ovary on this exam. As this patient is scheduled to undergo total hysterectomy with bilateral salpingo oophorectomy in the near future, close attention to this region at surgical intervention is recommended.     03/27/2020 Pathology Results   A. CUL DE SAC, ANTERIOR, BIOPSY:  -  Benign fibrovascular tissue with mesothelial hyperplasia  -  No carcinoma identified  -  See comment   B. MONS, RIGHT, BIOPSY:  -  Melanocytic nevus, intradermal type  -  See comment   C. LYMPH NODE, SENTINEL, RIGHT EXTERNAL ILIAC, BIOPSY:  -  No carcinoma identified in one lymph node (0/1)  -  See comment   D. PERITONEUM, LEFT SIDEWALL, BIOPSY:  -  Benign fibrovascular tissue with chronic inflammation  -  No carcinoma identified   E. LYMPH NODE, SENTINEL, LEFT EXTERNAL ILIAC, BIOPSY:  -  No carcinoma identified in one lymph node (0/1)  -  See comment   F. UTERUS, CERVIX, BILATERAL FALLOPIAN TUBES AND OVARIES:   Uterus:  -  Endometrioid carcinoma, FIGO grade 2  -  Leiomyomata (1.6 cm ; largest)  -  Carcinoma present in lymphoid aggregate in serosal adhesions  (lymphovascular space invasion)  -  See oncology table and comment  below   Cervix:  -  No carcinoma identified   Bilateral Ovaries:  -  No carcinoma identified   Bilateral Fallopian tubes:  -  Carcinoma within lumen of fallopian tube  (left)   G. PERIURETHRAL, BIOPSY:  -  No carcinoma identified    03/27/2020 Surgery   Robotic-assisted laparoscopic total hysterectomy with bilateral salpingoophorectomy, SLN biopsy   On EUA, very  narrow introitus most due to tight hymenal ring. Small mobile uterus. Hyperpigmented 0.52mm plaque on right mons, biopsied. Urethral overall normal in appearance, given hyperemic appearance in clinic, biopsy taken. On intra-abdominal entry, some scarring noted on inferior aspect of the liver, anterior right diaphragm and left lobe of the liver. Omentum normal appearing. Mesentery of the small and large bowel as well as the bowel itself studded with 1-62mm inflammatory-appearing nodules. Appendix with same studding, otherwise normal in appearance. Uterus 6cm and normal appearing with the exception of inflammatory exudate on posterior aspect of the fundus. Bilateral adnexa normal appearing. Chocolate brown staining within most of the cul-de-sac. Inflammatory appearing nodules studding bilateral pelvic walls, anterior and posterior cul-de-sac c/w endometriosis. No adenopathy. No intra-abdominal or pelvic evidence of disease.   03/2020 Initial Biopsy   EMB - gr 1-2 EMCA   03/27/2020 Cancer Staging   Staging form: Corpus Uteri - Carcinoma and Carcinosarcoma, AJCC 8th Edition - Clinical stage from 03/27/2020: FIGO Stage IIIA (cT3a, cN0(sn), cM0) - Signed by [name], MD on 04/24/2020   04/23/2020 Imaging   Status post hysterectomy and bilateral salpingo-oophorectomy.   Mild stranding along the anterior transverse mesocolon. No frank peritoneal nodularity or omental caking. Attention on follow-up is suggested.   No findings specific for recurrent or metastatic disease.   Bladder is mildly thick-walled although underdistended.     04/27/2020 - 08/17/2020 Chemotherapy   The patient had carboplatin and taxol for chemotherapy treatment.     07/09/2020 - 07/26/2020 Radiation Therapy   VBT: 30 Gy, 5 fractions, HDR   10/04/2020 Imaging   Resolution of omental soft tissue stranding since prior study. No evidence of recurrent or metastatic carcinoma within the abdomen or pelvis     Interval History: Patient  presents today to the office for continued follow-up/surveillance examination.  She reports overall doing well since her last visit.  She does state that morning appointments are challenging for her given her routine, working night shift.  She feels like the older she gets, it is harder for her to adjust to that routine.  She states she is able to sleep during the day.  She finds herself snacking more and notes that her weight is up to 154.  She has tried to increase her activity which has been causing more joint pain.  She states her reflux symptoms are better since she is up moving more.  She was eating and then lying down in the bed which triggered the reflux.  She is using Pepcid as needed.    She states the abdominal bloating is better, still up and down.  She denies pain. She feels her bowels are functioning better.  No rectal bleeding or change in bowel habits reported.  She continues to have yellow vaginal discharge.  She is not using the vaginal estrogen and is not frequently using the vaginal dilator.  She is emptying her bladder without difficulty and feels she is not drinking enough water.  She states she holds her urine while at work but seems to urinate frequently when at home. Last mammogram in 04/2022. States  she is overdue for a screening colonoscopy. Of note, she saw Dr. Sondra Come on 08/07/2022.    Past Medical/Surgical History: Past Medical History:  Diagnosis Date   Abnormal EKG 08/10/15   Anxiety and depression    pt denies 7/16/   Bruises easily    Chest pain    Chest pain with moderate risk of acute coronary syndrome    Chest tightness    Difficult intubation    throat feels small pt has acid reflux ? scars due to acid    Elevated cholesterol    Endometrial cancer (HCC)    Family history of premature CAD 10-Aug-2015   Father had MI 31's, died at 21 of an MI    Fatigue    Fibroid    Fx sacrum/coccyx-closed ([place])    Gastritis    GERD (gastroesophageal reflux disease)     History of radiation therapy 07/09/2020-07/26/2020   vaginal HDR brachytherapy    Dr Gery Pray   Hyperlipidemia 2015/08/10   Osteopenia    Pigmented skin lesions    Pre-diabetes    Sinus problem    SOB (shortness of breath) on exertion    Urethra disorder    currently swollen per pt    Vitamin D deficiency    Weight gain     Past Surgical History:  Procedure Laterality Date   ADENOIDECTOMY     COLONOSCOPY     ROBOTIC ASSISTED TOTAL HYSTERECTOMY WITH BILATERAL SALPINGO OOPHERECTOMY Bilateral 03/27/2020   Procedure: XI ROBOTIC ASSISTED TOTAL HYSTERECTOMY WITH BILATERAL SALPINGO OOPHORECTOMY;  Surgeon: [name], MD;  Location: [facility];  Service: Gynecology;  Laterality: Bilateral;   SENTINEL NODE BIOPSY N/A 03/27/2020   Procedure: SENTINEL NODE BIOPSY, URETHRAL BIOPSY AND VAGINAL CULTURE;  Surgeon: [name], MD;  Location: [facility];  Service: Gynecology;  Laterality: N/A;   SMALL BOWEL ENTEROSCOPY     TONSILLECTOMY      Family History  Problem Relation Age of Onset   Atrial fibrillation Mother    Hypertension Mother    Stroke Mother    Arthritis Mother    Atrial fibrillation Father    Heart attack Father 14   Hypertension Father    COPD Father    Diabetes Father    Colon cancer Paternal Grandmother    Breast cancer Neg Hx     Social History   Socioeconomic History   Marital status: Single    Spouse name: Not on file   Number of children: 0   Years of education: 16   Highest education level: Not on file  Occupational History   Occupation: Therapist, sports  Tobacco Use   Smoking status: Never   Smokeless tobacco: Never  Vaping Use   Vaping Use: Never used  Substance and Sexual Activity   Alcohol use: Never   Drug use: No   Sexual activity: Not Currently  Other Topics Concern   Not on file  Social History Narrative   Not on file   Social Determinants of Health   Financial Resource Strain: Not on file  Food Insecurity: Not on file  Transportation  Needs: Not on file  Physical Activity: Not on file  Stress: Not on file  Social Connections: Not on file    Current Medications:  Current Outpatient Medications:    Cholecalciferol (VITAMIN D) 125 MCG (5000 UT) CAPS, Take 5,000 Units by mouth daily., Disp: , Rfl:    famotidine (PEPCID) 20 MG tablet, Take 1 tablet (20 mg total) by mouth daily  as needed for heartburn or indigestion., Disp: 30 tablet, Rfl: 2  Review of Systems: +weight gain. See interval. Denies fevers, chills, fatigue, unexplained weight changes. Denies hearing loss, neck lumps or masses, mouth sores, ringing in ears or voice changes. Denies cough or wheezing.  Denies shortness of breath. Denies chest pain or palpitations. Denies leg swelling. Denies abdominal pain, blood in stools, diarrhea, nausea, vomiting, or early satiety. Denies pain with intercourse, dysuria, frequency, hematuria or incontinence. Denies hot flashes, pelvic pain, vaginal bleeding. +yellow vaginal discharge   Denies joint pain, back pain or muscle pain/cramps. Denies itching, rash, or wounds. Denies dizziness, headaches, numbness or seizures. Denies swollen lymph nodes or glands, denies easy bruising or bleeding. Denies new symptoms of anxiety, depression, confusion, or decreased concentration.  Physical Exam: BP 130/78 (BP Location: Right Arm, Patient Position: Sitting)   Pulse 77   Resp 18   Ht 5\' 3"  (1.6 m)   Wt 154 lb (69.9 kg)   SpO2 99%   BMI 27.28 kg/m  General: Well developed, well nourished female in no acute distress. Alert and oriented x 3.  HEENT: Normocephalic, atraumatic sclera anicteric. Neck: Supple without any enlargements.  Lymph node survey: No cervical, supraclavicular, or inguinal adenopathy.  Cardiovascular: Regular rate and rhythm. S1 and S2 normal. No murmurs. Lungs: Clear to auscultation bilaterally. No wheezes/crackles/rhonchi noted.  Skin: No rashes or lesions present. Back: No CVA tenderness.  Abdomen:  Abdomen soft, non-tender. Active bowel sounds in all quadrants. No evidence of a fluid wave or abdominal masses. No masses or hepatosplenomegaly appreciated.  Well-healed incisions. Genitourinary:  GU: Normal appearing external genitalia without erythema, excoriation, or lesions.  Speculum exam reveals moderately atrophic vaginal mucosa, no discharge or bleeding noted.  No masses.  Bimanual exam reveals no masses or nodularity.  Rectovaginal exam confirms these findings. Extremities: No bilateral cyanosis, edema, or clubbing. Grossly normal range of motion.  Warm, well perfused.  Laboratory & Radiologic Studies: None new  Assessment & Plan: Emily [name] is a 62 y.o. 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 She completed adjuvant chemotherapy and vaginal brachytherapy in 08/2020. MSI stable.   Patient is overall doing well and is NED on exam today.    From a cancer standpoint, we will continue with surveillance visits every 3 months until 2-3 years out from completion of adjuvant therapy at which point we will transition to visits every 6 months.  We reviewed signs and symptoms that would be concerning for cancer recurrence and handout on AVS given. She is advised to call if she develops any of these between visits.  She will see Dr. Sondra Come in June 2024 and our office in September 2024 or sooner if needed.  20 minutes of total time was spent for this patient encounter, including preparation, face-to-face counseling with the patient and coordination of care, and documentation of the encounter.  [facility]

## 2022-12-19 ENCOUNTER — Other Ambulatory Visit (HOSPITAL_COMMUNITY): Payer: Self-pay

## 2022-12-22 ENCOUNTER — Other Ambulatory Visit (HOSPITAL_COMMUNITY): Payer: Self-pay

## 2023-01-29 ENCOUNTER — Ambulatory Visit: Payer: Self-pay | Admitting: Radiation Oncology

## 2023-01-29 ENCOUNTER — Telehealth: Payer: Self-pay | Admitting: *Deleted

## 2023-01-29 NOTE — Telephone Encounter (Signed)
RETURNED PATIENT'S PHONE CALL, SPOKE WITH PATIENT. ?

## 2023-02-12 ENCOUNTER — Encounter: Payer: Commercial Managed Care - PPO | Admitting: Sports Medicine

## 2023-02-17 ENCOUNTER — Ambulatory Visit (INDEPENDENT_AMBULATORY_CARE_PROVIDER_SITE_OTHER): Payer: Commercial Managed Care - PPO | Admitting: Sports Medicine

## 2023-02-17 VITALS — BP 122/70 | Ht 64.0 in | Wt 160.0 lb

## 2023-02-17 DIAGNOSIS — M25471 Effusion, right ankle: Secondary | ICD-10-CM

## 2023-02-18 NOTE — Progress Notes (Signed)
   Subjective:    Patient ID: Emily [name], female    DOB: 01-20-1961, 62 y.o.   MRN: [id number]  HPI chief complaint: Right ankle swelling and need for new orthotics  [name] presents today requesting new custom orthotics.  Her old custom orthotics are still in good shape but have quite a bit of wear.  We made them for her in 2020.  She is describing some swelling along the anterior lateral right ankle as well.  This is accompanied by a feeling of fullness in the right ankle.  No recent trauma.   Review of Systems As above    Objective:   Physical Exam  Well-developed, well-nourished.  No acute distress  Examination of the right foot and ankle shows some soft tissue swelling in the sinus tarsi area which may be consistent with a ganglion cyst.  No tenderness to palpation.  Nonerythematous.  Not warm to touch.  She has bilateral pes cavus.  Decreased transverse arch bilaterally as well.  Good pulses.      Assessment & Plan:   Right ankle swelling possibly secondary to small ganglion cyst versus sinus tarsi syndrome Cavus foot  New custom orthotics were created as below.  Gait was noted to be neutral with orthotics in place.  She found them to be comfortable prior to leaving.  For her right ankle swelling we will try a body helix compression sleeve.  We also discussed formal physical therapy for some overall lower extremity weakness which is the result of treatment for endometrial cancer.  She will be leaving soon for vacation but will give this some serious thought upon returning to [place].  Otherwise, she will follow-up with me as needed.  Patient was fitted for a : standard, cushioned, semi-rigid orthotic. The orthotic was heated and afterward the patient stood on the orthotic blank positioned on the orthotic stand. The patient was positioned in subtalar neutral position and 10 degrees of ankle dorsiflexion in a weight bearing stance. After completion of molding, a stable  base was applied to the orthotic blank. The blank was ground to a stable position for weight bearing. Size: 7 Base: Blue EVA Posting: None Additional orthotic padding: None  This note was dictated using Dragon naturally speaking software and may contain errors in syntax, spelling, or content which have not been identified prior to signing this note.

## 2023-02-19 ENCOUNTER — Encounter: Payer: Commercial Managed Care - PPO | Admitting: Sports Medicine

## 2023-03-02 ENCOUNTER — Ambulatory Visit: Payer: Commercial Managed Care - PPO | Admitting: Radiation Oncology

## 2023-03-04 NOTE — Progress Notes (Signed)
  Radiation Oncology         [phone]) [phone] ________________________________  Name: Emily [name] MRN: [id number]  Date: 03/05/2023  DOB: 11/27/1960  Follow-Up Visit Note  CC: Camie Patience, FNP  Carver Fila, MD  No diagnosis found.  Diagnosis: Stage IIIA (pT3a, pN0) endometrioid endometrial adenocarcinoma, FIGO grade 2   Interval Since Last Radiation: 2 years, 7 months, and 9 days   HDR brachytherapy dates: 07/09/2020, 07/12/2020, 07/16/2020, 07/19/2020, and 07/26/2020   Narrative:  The patient returns today for routine follow-up. She was last seen here for follow-up on 08/07/22. Since her last visit, the patient followed up with NP Cross (Gyn-Onc) on 12/04/22. During which time, the patient reported ongoing yellowish vaginal discharge, and intermittent but improving abdominal bloating. She was also noted to not be using her vaginal estrogen and rarely using her vaginal dilator. She was otherwise noted as NED on examination and denied any symptoms concerning for disease recurrence.  No other significant interval history since the patient was last seen for follow-up.   ***                                Allergies:  is allergic to ceftin [cefuroxime], crestor [rosuvastatin calcium], and latex.  Meds: Current Outpatient Medications  Medication Sig Dispense Refill   Cholecalciferol (VITAMIN D) 125 MCG (5000 UT) CAPS Take 5,000 Units by mouth daily.     famotidine (PEPCID) 20 MG tablet Take 1 tablet (20 mg total) by mouth daily as needed for heartburn or indigestion. 30 tablet 2   No current facility-administered medications for this encounter.    Physical Findings: The patient is in no acute distress. Patient is alert and oriented.  vitals were not taken for this visit. .  No significant changes. Lungs are clear to auscultation bilaterally. Heart has regular rate and rhythm. No palpable cervical, supraclavicular, or axillary adenopathy. Abdomen soft, non-tender, normal  bowel sounds.  On pelvic examination the external genitalia were unremarkable. A speculum exam was performed. There are no mucosal lesions noted in the vaginal vault. A Pap smear was obtained of the proximal vagina. On bimanual and rectovaginal examination there were no pelvic masses appreciated. ***   Lab Findings: Lab Results  Component Value Date   WBC 7.7 11/07/2021   HGB 13.8 11/07/2021   HCT 40.8 11/07/2021   MCV 86.6 11/07/2021   PLT 259 11/07/2021    Radiographic Findings: No results found.  Impression: Stage IIIA (pT3a, pN0) endometrioid endometrial adenocarcinoma, FIGO grade 2   The patient is recovering from the effects of radiation.  ***  Plan:  ***   *** minutes of total time was spent for this patient encounter, including preparation, face-to-face counseling with the patient and coordination of care, physical exam, and documentation of the encounter. ____________________________________  Billie Lade, PhD, MD  This document serves as a record of services personally performed by Antony Blackbird, MD. It was created on his behalf by Neena Rhymes, a trained medical scribe. The creation of this record is based on the scribe's personal observations and the provider's statements to them. This document has been checked and approved by the attending provider.

## 2023-03-05 ENCOUNTER — Other Ambulatory Visit: Payer: Self-pay

## 2023-03-05 ENCOUNTER — Encounter: Payer: Self-pay | Admitting: Radiation Oncology

## 2023-03-05 ENCOUNTER — Ambulatory Visit
Admission: RE | Admit: 2023-03-05 | Discharge: 2023-03-05 | Disposition: A | Discharge status: Home / Self Care | Payer: Commercial Managed Care - PPO | Source: Ambulatory Visit | Attending: Radiation Oncology | Admitting: Radiation Oncology

## 2023-03-05 VITALS — BP 134/80 | HR 91 | Temp 97.5°F | Resp 18 | Ht 64.0 in | Wt 156.2 lb

## 2023-03-05 DIAGNOSIS — Z923 Personal history of irradiation: Secondary | ICD-10-CM | POA: Insufficient documentation

## 2023-03-05 DIAGNOSIS — Z8542 Personal history of malignant neoplasm of other parts of uterus: Secondary | ICD-10-CM | POA: Insufficient documentation

## 2023-03-05 DIAGNOSIS — C541 Malignant neoplasm of endometrium: Secondary | ICD-10-CM

## 2023-03-05 NOTE — Progress Notes (Signed)
Emily [name] is here today for follow up post radiation to the pelvic.  They completed their radiation on: 07/26/20   Does the patient complain of any of the following:  Pain:Denies pelvic pain reports right leg swelling and discomfort. Which she was told was a ganglion cyst.  Abdominal bloating: Yes, improving.  Diarrhea/Constipation: Constipation Nausea/Vomiting: No Vaginal Discharge: No Blood in Urine or Stool: No Urinary Issues (dysuria/incomplete emptying/ incontinence/ increased frequency/urgency): Reports vaginal odor when urinating.  Does patient report using vaginal dilator 2-3 times a week and/or sexually active 2-3 weeks: No Post radiation skin changes: No   Additional comments if applicable:    BP 134/80 (BP Location: Left Arm, Patient Position: Sitting)   Pulse 91   Temp (!) 97.5 F (36.4 C) (Temporal)   Resp 18   Ht 5\' 4"  (1.626 m)   Wt 156 lb 4 oz (70.9 kg)   SpO2 99%   BMI 26.82 kg/m

## 2023-04-03 ENCOUNTER — Telehealth: Payer: Self-pay | Admitting: Internal Medicine

## 2023-04-03 NOTE — Telephone Encounter (Signed)
Pt was calling to ask the names of our 2 peripheral vascular Doctors in our practice.   She said her PCP is considering referring her to a PV Doctor for vascular issues post-radiation, and she wanted to ask the name of our PV Doctors, so that she could endorse to her PCP at the next visit, if referral is indicated.  Informed the pt that our 2 PV Providers are Dr. Kirke Corin and Dr. Allyson Sabal.  She is aware both Providers see PV patients at our [facility] location.  Pt noted the names and will endorse to her PCP.  Pt thanked me for the call back and was very appreciative.

## 2023-04-03 NOTE — Telephone Encounter (Signed)
Past patient is calling to get advice and speak to [facility], RN in regards to reestablishing as a patient.

## 2023-04-08 DIAGNOSIS — Z9109 Other allergy status, other than to drugs and biological substances: Secondary | ICD-10-CM | POA: Diagnosis not present

## 2023-04-08 DIAGNOSIS — C541 Malignant neoplasm of endometrium: Secondary | ICD-10-CM | POA: Diagnosis not present

## 2023-04-08 DIAGNOSIS — Z0000 Encounter for general adult medical examination without abnormal findings: Secondary | ICD-10-CM | POA: Diagnosis not present

## 2023-04-08 DIAGNOSIS — F418 Other specified anxiety disorders: Secondary | ICD-10-CM | POA: Diagnosis not present

## 2023-04-08 DIAGNOSIS — K219 Gastro-esophageal reflux disease without esophagitis: Secondary | ICD-10-CM | POA: Diagnosis not present

## 2023-04-08 DIAGNOSIS — R7303 Prediabetes: Secondary | ICD-10-CM | POA: Diagnosis not present

## 2023-04-08 DIAGNOSIS — E559 Vitamin D deficiency, unspecified: Secondary | ICD-10-CM | POA: Diagnosis not present

## 2023-04-08 DIAGNOSIS — E538 Deficiency of other specified B group vitamins: Secondary | ICD-10-CM | POA: Diagnosis not present

## 2023-04-08 DIAGNOSIS — Z1211 Encounter for screening for malignant neoplasm of colon: Secondary | ICD-10-CM | POA: Diagnosis not present

## 2023-04-08 DIAGNOSIS — E785 Hyperlipidemia, unspecified: Secondary | ICD-10-CM | POA: Diagnosis not present

## 2023-04-09 DIAGNOSIS — E559 Vitamin D deficiency, unspecified: Secondary | ICD-10-CM | POA: Diagnosis not present

## 2023-04-09 DIAGNOSIS — E785 Hyperlipidemia, unspecified: Secondary | ICD-10-CM | POA: Diagnosis not present

## 2023-04-09 DIAGNOSIS — R7303 Prediabetes: Secondary | ICD-10-CM | POA: Diagnosis not present

## 2023-04-09 DIAGNOSIS — E538 Deficiency of other specified B group vitamins: Secondary | ICD-10-CM | POA: Diagnosis not present

## 2023-06-18 ENCOUNTER — Telehealth: Payer: Self-pay | Admitting: *Deleted

## 2023-06-18 NOTE — Telephone Encounter (Signed)
Talbert Forest from (RAD ONC) called office.  Pt is scheduled for a follow up on December 6th  at 0845 with Dr. Pricilla Holm.  Talbert Forest to notify pt of appointment date and time.

## 2023-06-18 NOTE — Telephone Encounter (Signed)
Called patient to inform of fu appt. with Dr. Pricilla Holm on 08-14-23- arrival time- 8:30 am , lvm for a return call

## 2023-06-24 ENCOUNTER — Other Ambulatory Visit (HOSPITAL_BASED_OUTPATIENT_CLINIC_OR_DEPARTMENT_OTHER): Payer: Self-pay

## 2023-06-24 DIAGNOSIS — R12 Heartburn: Secondary | ICD-10-CM | POA: Diagnosis not present

## 2023-06-24 DIAGNOSIS — R29898 Other symptoms and signs involving the musculoskeletal system: Secondary | ICD-10-CM | POA: Diagnosis not present

## 2023-06-24 DIAGNOSIS — R9431 Abnormal electrocardiogram [ECG] [EKG]: Secondary | ICD-10-CM | POA: Diagnosis not present

## 2023-06-24 DIAGNOSIS — L84 Corns and callosities: Secondary | ICD-10-CM | POA: Diagnosis not present

## 2023-06-24 DIAGNOSIS — R0789 Other chest pain: Secondary | ICD-10-CM | POA: Diagnosis not present

## 2023-06-24 MED ORDER — FAMOTIDINE 20 MG PO TABS
20.0000 mg | ORAL_TABLET | Freq: Every evening | ORAL | 1 refills | Status: AC | PRN
  Filled 2023-06-24: qty 90, 90d supply, fill #0

## 2023-07-03 ENCOUNTER — Other Ambulatory Visit (HOSPITAL_BASED_OUTPATIENT_CLINIC_OR_DEPARTMENT_OTHER): Payer: Self-pay

## 2023-07-15 ENCOUNTER — Telehealth: Payer: Self-pay

## 2023-07-15 NOTE — Telephone Encounter (Signed)
Opened in error

## 2023-07-23 ENCOUNTER — Encounter: Payer: Self-pay | Admitting: Gynecologic Oncology

## 2023-07-23 ENCOUNTER — Ambulatory Visit: Payer: Self-pay | Admitting: Radiation Oncology

## 2023-07-23 ENCOUNTER — Other Ambulatory Visit (HOSPITAL_BASED_OUTPATIENT_CLINIC_OR_DEPARTMENT_OTHER): Payer: Self-pay

## 2023-07-23 ENCOUNTER — Inpatient Hospital Stay: Payer: Commercial Managed Care - PPO | Attending: Gynecologic Oncology | Admitting: Gynecologic Oncology

## 2023-07-23 VITALS — BP 107/79 | HR 81 | Temp 97.8°F | Resp 20 | Wt 161.0 lb

## 2023-07-23 DIAGNOSIS — Z9221 Personal history of antineoplastic chemotherapy: Secondary | ICD-10-CM | POA: Insufficient documentation

## 2023-07-23 DIAGNOSIS — Z90722 Acquired absence of ovaries, bilateral: Secondary | ICD-10-CM | POA: Diagnosis not present

## 2023-07-23 DIAGNOSIS — Z9079 Acquired absence of other genital organ(s): Secondary | ICD-10-CM | POA: Insufficient documentation

## 2023-07-23 DIAGNOSIS — Z923 Personal history of irradiation: Secondary | ICD-10-CM | POA: Diagnosis not present

## 2023-07-23 DIAGNOSIS — K219 Gastro-esophageal reflux disease without esophagitis: Secondary | ICD-10-CM | POA: Insufficient documentation

## 2023-07-23 DIAGNOSIS — C541 Malignant neoplasm of endometrium: Secondary | ICD-10-CM

## 2023-07-23 DIAGNOSIS — Z90710 Acquired absence of both cervix and uterus: Secondary | ICD-10-CM | POA: Diagnosis not present

## 2023-07-23 DIAGNOSIS — Z8542 Personal history of malignant neoplasm of other parts of uterus: Secondary | ICD-10-CM | POA: Diagnosis not present

## 2023-07-23 MED ORDER — PANTOPRAZOLE SODIUM 20 MG PO TBEC
20.0000 mg | DELAYED_RELEASE_TABLET | Freq: Every day | ORAL | 1 refills | Status: AC
  Filled 2023-07-23: qty 15, 15d supply, fill #0

## 2023-07-23 NOTE — Progress Notes (Signed)
Gynecologic Oncology Return Clinic Visit  07/23/23  Reason for Visit: surveillance visit  Treatment History: Oncology History Overview Note  IHC MMR normal Endometrioid MSI-stable   Endometrial cancer (HCC)  02/28/2020 Initial Diagnosis   The patient presented for an exam on 6/22.  At that time she endorsed episodes of postmenopausal bleeding starting in 18-Dec-2020after her mother's death.  She had not had a menses in a number of years.  Patient's exam was limited by her intolerance in the position of the cervix.  Pap test was performed on 6/30 showing atypical glandular cells of undetermined significance. 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 She describes about a year long history previously of clear vaginal discharge. She saw her PCP for this complaint and given what was felt to be urethral swelling, she was treated for a UTI (lab testing did not show an infection). She has not been sexually active since her 30s. She endorses increased urinary frequency. After a year, the clear discharge became yellow (and at one point looked green). The patient was seen again and a pap was attempted in the office but she was unable to tolerate this. She performed a vaginal swab on herself that found yeast. She used Monistat multiple times with improvement in her discharge. She then began to have pale pink discharge. At this time, she was seen by a Urogynecologist at [facility] and it sound like she had urodynamic testing. She describes a raw feeling and burning with her urine. She continues to have vaginal discharge which she notes has an ammonia-like smell. Her vaginal bleeding started again about a month ago. She briefly tried vaginal estrogen after seeing the Urogynecologist but she felt that the bleeding worsened as did the ammonia smell, so she stopped.   03/16/2020 Initial Diagnosis   Endometrial cancer (HCC)   03/21/2020 Imaging   1. Abnormally  thickened endometrial complex measuring up to 12 mm, presumably related to known history of endometrial carcinoma. 2. Underlying fibroid uterus as detailed above. 3. Normal sonographic evaluation of the ovaries. No abnormal free fluid. Please note that the appendix appears to be potentially located in close proximity to the right ovary on this exam. As this patient is scheduled to undergo total hysterectomy with bilateral salpingo oophorectomy in the near future, close attention to this region at surgical intervention is recommended.     03/27/2020 Pathology Results   A. CUL DE SAC, ANTERIOR, BIOPSY:  -  Benign fibrovascular tissue with mesothelial hyperplasia  -  No carcinoma identified  -  See comment   B. MONS, RIGHT, BIOPSY:  -  Melanocytic nevus, intradermal type  -  See comment   C. LYMPH NODE, SENTINEL, RIGHT EXTERNAL ILIAC, BIOPSY:  -  No carcinoma identified in one lymph node (0/1)  -  See comment   D. PERITONEUM, LEFT SIDEWALL, BIOPSY:  -  Benign fibrovascular tissue with chronic inflammation  -  No carcinoma identified   E. LYMPH NODE, SENTINEL, LEFT EXTERNAL ILIAC, BIOPSY:  -  No carcinoma identified in one lymph node (0/1)  -  See comment   F. UTERUS, CERVIX, BILATERAL FALLOPIAN TUBES AND OVARIES:   Uterus:  -  Endometrioid carcinoma, FIGO grade 2  -  Leiomyomata (1.6 cm ; largest)  -  Carcinoma present in lymphoid aggregate in serosal adhesions  (lymphovascular space invasion)  -  See oncology table and comment below   Cervix:  -  No carcinoma identified   Bilateral  Ovaries:  -  No carcinoma identified   Bilateral Fallopian tubes:  -  Carcinoma within lumen of fallopian tube  (left)   G. PERIURETHRAL, BIOPSY:  -  No carcinoma identified    03/27/2020 Surgery   Robotic-assisted laparoscopic total hysterectomy with bilateral salpingoophorectomy, SLN biopsy   On EUA, very narrow introitus most due to tight hymenal ring. Small mobile uterus. Hyperpigmented  0.42mm plaque on right mons, biopsied. Urethral overall normal in appearance, given hyperemic appearance in clinic, biopsy taken. On intra-abdominal entry, some scarring noted on inferior aspect of the liver, anterior right diaphragm and left lobe of the liver. Omentum normal appearing. Mesentery of the small and large bowel as well as the bowel itself studded with 1-42mm inflammatory-appearing nodules. Appendix with same studding, otherwise normal in appearance. Uterus 6cm and normal appearing with the exception of inflammatory exudate on posterior aspect of the fundus. Bilateral adnexa normal appearing. Chocolate brown staining within most of the cul-de-sac. Inflammatory appearing nodules studding bilateral pelvic walls, anterior and posterior cul-de-sac c/w endometriosis. No adenopathy. No intra-abdominal or pelvic evidence of disease.   03/2020 Initial Biopsy   EMB - gr 1-2 EMCA   03/27/2020 Cancer Staging   Staging form: Corpus Uteri - Carcinoma and Carcinosarcoma, AJCC 8th Edition - Clinical stage from 03/27/2020: FIGO Stage IIIA (cT3a, cN0(sn), cM0) - Signed by Artis Delay, MD on 04/24/2020   04/23/2020 Imaging   Status post hysterectomy and bilateral salpingo-oophorectomy.   Mild stranding along the anterior transverse mesocolon. No frank peritoneal nodularity or omental caking. Attention on follow-up is suggested.   No findings specific for recurrent or metastatic disease.   Bladder is mildly thick-walled although underdistended.     04/27/2020 - 08/17/2020 Chemotherapy   The patient had carboplatin and taxol for chemotherapy treatment.     07/09/2020 - 07/26/2020 Radiation Therapy   VBT: 30 Gy, 5 fractions, HDR   10/04/2020 Imaging   Resolution of omental soft tissue stranding since prior study. No evidence of recurrent or metastatic carcinoma within the abdomen or pelvis     Interval History: Patient reports overall doing well.  She denies any abdominal or pelvic pain.  Reports  baseline bowel bladder function.  Has been using vaginal estrogen along with her dilator.  Past Medical/Surgical History: Past Medical History:  Diagnosis Date   Abnormal EKG 2015/07/29   Anxiety and depression    pt denies 7/16/   Bruises easily    Chest pain    Chest pain with moderate risk of acute coronary syndrome    Chest tightness    Difficult intubation    throat feels small pt has acid reflux ? scars due to acid    Elevated cholesterol    Endometrial cancer (HCC)    Family history of premature CAD 07-29-15   Father had MI 17's, died at 61 of an MI    Fatigue    Fibroid    Fx sacrum/coccyx-closed (HCC)    Gastritis    GERD (gastroesophageal reflux disease)    History of radiation therapy 07/09/2020-07/26/2020   vaginal HDR brachytherapy    Dr Antony Blackbird   Hyperlipidemia July 29, 2015   Osteopenia    Pigmented skin lesions    Pre-diabetes    Sinus problem    SOB (shortness of breath) on exertion    Urethra disorder    currently swollen per pt    Vitamin D deficiency    Weight gain     Past Surgical History:  Procedure Laterality Date  ADENOIDECTOMY     COLONOSCOPY     ROBOTIC ASSISTED TOTAL HYSTERECTOMY WITH BILATERAL SALPINGO OOPHERECTOMY Bilateral 03/27/2020   Procedure: XI ROBOTIC ASSISTED TOTAL HYSTERECTOMY WITH BILATERAL SALPINGO OOPHORECTOMY;  Surgeon: Carver Fila, MD;  Location: [facility];  Service: Gynecology;  Laterality: Bilateral;   SENTINEL NODE BIOPSY N/A 03/27/2020   Procedure: SENTINEL NODE BIOPSY, URETHRAL BIOPSY AND VAGINAL CULTURE;  Surgeon: Carver Fila, MD;  Location: [facility];  Service: Gynecology;  Laterality: N/A;   SMALL BOWEL ENTEROSCOPY     TONSILLECTOMY      Family History  Problem Relation Age of Onset   Atrial fibrillation Mother    Hypertension Mother    Stroke Mother    Arthritis Mother    Atrial fibrillation Father    Heart attack Father 37   Hypertension Father    COPD Father    Diabetes Father    Colon cancer  Paternal Grandmother    Breast cancer Neg Hx     Social History   Socioeconomic History   Marital status: Single    Spouse name: Not on file   Number of children: 0   Years of education: 16   Highest education level: Not on file  Occupational History   Occupation: Charity fundraiser  Tobacco Use   Smoking status: Never   Smokeless tobacco: Never  Vaping Use   Vaping status: Never Used  Substance and Sexual Activity   Alcohol use: Never   Drug use: No   Sexual activity: Not Currently  Other Topics Concern   Not on file  Social History Narrative   Not on file   Social Determinants of Health   Financial Resource Strain: Not on file  Food Insecurity: Not on file  Transportation Needs: Not on file  Physical Activity: Not on file  Stress: Not on file  Social Connections: Not on file    Current Medications:  Current Outpatient Medications:    Cholecalciferol (VITAMIN D) 125 MCG (5000 UT) CAPS, Take 5,000 Units by mouth daily., Disp: , Rfl:    famotidine (PEPCID AC MAXIMUM STRENGTH) 20 MG tablet, Take 1 tablet (20 mg total) by mouth at bedtime as needed., Disp: 90 tablet, Rfl: 1   pantoprazole (PROTONIX) 20 MG tablet, Take 1 tablet (20 mg total) by mouth daily., Disp: 15 tablet, Rfl: 1  Review of Systems: Denies appetite changes, fevers, chills, fatigue, unexplained weight changes. Denies hearing loss, neck lumps or masses, mouth sores, ringing in ears or voice changes. Denies cough or wheezing.  Denies shortness of breath. Denies chest pain or palpitations. Denies leg swelling. Denies abdominal distention, pain, blood in stools, constipation, diarrhea, nausea, vomiting, or early satiety. Denies pain with intercourse, dysuria, frequency, hematuria or incontinence. Denies hot flashes, pelvic pain, vaginal bleeding or vaginal discharge.   Denies joint pain, back pain or muscle pain/cramps. Denies itching, rash, or wounds. Denies dizziness, headaches, numbness or seizures. Denies swollen  lymph nodes or glands, denies easy bruising or bleeding. Denies anxiety, depression, confusion, or decreased concentration.  Physical Exam: BP 107/79 (BP Location: Left Arm, Patient Position: Sitting)   Pulse 81   Temp 97.8 F (36.6 C) (Oral)   Resp 20   Wt 161 lb (73 kg)   SpO2 98%   BMI 27.64 kg/m  General: Alert, oriented, no acute distress. HEENT: Normocephalic, atraumatic sclera anicteric. Chest: Clear to auscultation bilaterally.  No wheezes or rhonchi. Cardiovascular: Regular rate and rhythm, no murmurs. Abdomen: soft, nontender.  Normoactive bowel sounds.  No masses  or hepatosplenomegaly appreciated.  Well-healed incisions. Extremities: Grossly normal range of motion.  Warm, well perfused.  No edema bilaterally. Skin: No rashes or lesions noted. Lymphatics: No cervical, supraclavicular, or inguinal adenopathy. GU: Normal appearing external genitalia without erythema, excoriation, or lesions.  Speculum exam reveals moderately atrophic vaginal mucosa, no discharge or bleeding noted.  Some radiation changes noted.  No masses.  Bimanual exam reveals no masses or nodularity.  Rectovaginal exam confirms these findings.  Laboratory & Radiologic Studies: None new  Assessment & Plan: Emily [name] is a 62 y.o. 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 She completed adjuvant chemotherapy and vaginal brachytherapy in 08/2020. MSI stable.   Patient is overall doing well and is NED on exam today.     From a cancer standpoint, we will continue with surveillance visits every 6 months.  We reviewed signs and symptoms that would be concerning for cancer recurrence. She is advised to call if she develops any of these between visits.  She will see Dr. Roselind Messier in June 2025 and our office in November 2025 or sooner if needed.  Patient requested prescription for pantoprazole for GERD symptoms when Pepcid is not sufficient.   Prescription sent to her pharmacy.  22 minutes of total time was spent for this patient encounter, including preparation, face-to-face counseling with the patient and coordination of care, and documentation of the encounter.  Eugene Garnet, MD  Division of Gynecologic Oncology  Department of Obstetrics and Gynecology  [facility]

## 2023-07-23 NOTE — Patient Instructions (Signed)
It was good to see you today.  I do not see or feel any evidence of cancer recurrence on your exam.  I will see you for follow-up in 12 months.  Please call the office sometime next summer to schedule visit to see me in November 2025.  As always, if you develop any new and concerning symptoms before your next visit, please call to see me sooner.

## 2023-07-30 ENCOUNTER — Other Ambulatory Visit (HOSPITAL_BASED_OUTPATIENT_CLINIC_OR_DEPARTMENT_OTHER): Payer: Self-pay

## 2023-08-14 ENCOUNTER — Ambulatory Visit: Payer: Commercial Managed Care - PPO | Admitting: Gynecologic Oncology

## 2023-09-21 ENCOUNTER — Ambulatory Visit: Payer: Commercial Managed Care - PPO | Attending: Cardiovascular Disease | Admitting: Cardiovascular Disease

## 2023-09-21 ENCOUNTER — Other Ambulatory Visit (HOSPITAL_BASED_OUTPATIENT_CLINIC_OR_DEPARTMENT_OTHER): Payer: Self-pay

## 2023-09-21 ENCOUNTER — Encounter: Payer: Self-pay | Admitting: Cardiovascular Disease

## 2023-09-21 VITALS — BP 118/78 | HR 67 | Ht 64.0 in | Wt 161.2 lb

## 2023-09-21 DIAGNOSIS — M79604 Pain in right leg: Secondary | ICD-10-CM | POA: Diagnosis not present

## 2023-09-21 DIAGNOSIS — M79605 Pain in left leg: Secondary | ICD-10-CM | POA: Diagnosis not present

## 2023-09-21 DIAGNOSIS — E7800 Pure hypercholesterolemia, unspecified: Secondary | ICD-10-CM

## 2023-09-21 DIAGNOSIS — R9431 Abnormal electrocardiogram [ECG] [EKG]: Secondary | ICD-10-CM

## 2023-09-21 DIAGNOSIS — M79606 Pain in leg, unspecified: Secondary | ICD-10-CM | POA: Insufficient documentation

## 2023-09-21 MED ORDER — ATORVASTATIN CALCIUM 20 MG PO TABS
20.0000 mg | ORAL_TABLET | Freq: Every day | ORAL | 3 refills | Status: AC
  Filled 2023-09-21: qty 90, 90d supply, fill #0

## 2023-09-21 NOTE — Assessment & Plan Note (Signed)
 Apparently patient's mother had PAD in her 17s.  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  She does complain of some pain mostly around her and is worried about PAD.  She has 2+ pedal pulses bilaterally.  I am going to get ABIs to further evaluate.

## 2023-09-21 NOTE — Assessment & Plan Note (Signed)
 History of hyperlipidemia not on statin therapy with lipid profile performed 04/09/2023 revealing total cholesterol 245, LDL 163 and HDL of 66.  Given her coronary calcium  score of 0 with CTA that showed no CAD back in 2017 her LDL goal should be in 100.  Will start her on atorvastatin  20 mg a day and we will recheck a lipid liver profile in 3 months.

## 2023-09-21 NOTE — Patient Instructions (Signed)
 Medication Instructions:  Your physician has recommended you make the following change in your medication:   -Start atorvastatin  (lipitor) 20mg  once daily in the evening.  *If you need a refill on your cardiac medications before your next appointment, please call your pharmacy*   Lab Work: Your physician recommends that you return for lab work in: 3 months for FASTING lipid/liver panel  If you have labs (blood work) drawn today and your tests are completely normal, you will receive your results only by: MyChart Message (if you have MyChart) OR A paper copy in the mail If you have any lab test that is abnormal or we need to change your treatment, we will call you to review the results.   Testing/Procedures: Your physician has requested that you have an ankle brachial index (ABI). During this test an ultrasound and blood pressure cuff are used to evaluate the arteries that supply the arms and legs with blood. Allow thirty minutes for this exam. There are no restrictions or special instructions. This will take place at [address], [address].    Please note: We ask at that you not bring children with you during ultrasound (echo/ vascular) testing. Due to room size and safety concerns, children are not allowed in the ultrasound rooms during exams. Our front office staff cannot provide observation of children in our lobby area while testing is being conducted. An adult accompanying a patient to their appointment will only be allowed in the ultrasound room at the discretion of the ultrasound technician under special circumstances. We apologize for any inconvenience.    Follow-Up: At [facility], you and your health needs are our priority.  As part of our continuing mission to provide you with exceptional heart care, we have created designated Provider Care Teams.  These Care Teams include your primary Cardiologist (physician) and Advanced Practice Providers (APPs -  Physician  Assistants and Nurse Practitioners) who all work together to provide you with the care you need, when you need it.  We recommend signing up for the patient portal called MyChart.  Sign up information is provided on this After Visit Summary.  MyChart is used to connect with patients for Virtual Visits (Telemedicine).  Patients are able to view lab/test results, encounter notes, upcoming appointments, etc.  Non-urgent messages can be sent to your provider as well.   To learn more about what you can do with MyChart, go to [email or web address].    Your next appointment:   We will see you on an as needed basis  Provider:   Dorn Lesches, MD

## 2023-09-21 NOTE — Progress Notes (Signed)
 09/21/2023 Emily [name]   1961-08-13  [id number]  Primary Physician [name], FNP Primary Cardiologist: Dorn JINNY Lesches MD [facility], [place], [place]  HPI:  Emily [name] is a 63 y.o. 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  She has seen Dr. Maranda and Dr. Waddell in the past for atypical chest pain and an abnormal EKG with anterolateral T wave inversion.  She has had a coronary CTA performed 10/11/2015 that showed coronary calcium  score of 0 with no evidence of CAD.  Her risk factors include untreated hyperlipidemia and family history of CABG.  Her biggest complaint is pain in her legs when she walks pacifically her right leg.  Her mother apparently had PAD.   Current Meds  Medication Sig   Cholecalciferol (VITAMIN D) 125 MCG (5000 UT) CAPS Take 5,000 Units by mouth daily.   famotidine  (PEPCID  AC MAXIMUM STRENGTH) 20 MG tablet Take 1 tablet (20 mg total) by mouth at bedtime as needed.   fluticasone  (FLONASE ) 50 MCG/ACT nasal spray Place 2 sprays into both nostrils as needed.   pantoprazole  (PROTONIX ) 20 MG tablet Take 1 tablet (20 mg total) by mouth daily. (Patient taking differently: Take 20 mg by mouth as needed.)     Allergies  Allergen Reactions   Ceftin [Cefuroxime]     Causes yeast in throat    Crestor  [Rosuvastatin  Calcium ] Other (See Comments)    Causes calf pain   Latex Itching, Rash and Other (See Comments)    Social History   Socioeconomic History   Marital status: Single    Spouse name: Not on file   Number of children: 0   Years of education: 16   Highest education level: Not on file  Occupational History   Occupation: RN  Tobacco Use   Smoking status: Never   Smokeless tobacco: Never  Vaping Use   Vaping status: Never Used  Substance and Sexual Activity   Alcohol use: Never   Drug use: No   Sexual activity: Not Currently  Other Topics Concern   Not  on file  Social History Narrative   Not on file   Social Drivers of Health   Financial Resource Strain: Not on file  Food Insecurity: Not on file  Transportation Needs: Not on file  Physical Activity: Not on file  Stress: Not on file  Social Connections: Not on file  Intimate Partner Violence: Not on file     Review of Systems: General: negative for chills, fever, night sweats or weight changes.  Cardiovascular: negative for chest pain, dyspnea on exertion, edema, orthopnea, palpitations, paroxysmal nocturnal dyspnea or shortness of breath Dermatological: negative for rash Respiratory: negative for cough or wheezing Urologic: negative for hematuria Abdominal: negative for nausea, vomiting, diarrhea, bright red blood per rectum, melena, or hematemesis Neurologic: negative for visual changes, syncope, or dizziness All other systems reviewed and are otherwise negative except as noted above.    Blood pressure 118/78, pulse 67, height 5' 4 (1.626 m), weight 161 lb 3.2 oz (73.1 kg), SpO2 98%.  General appearance: alert and no distress Neck: no adenopathy, no carotid bruit, no JVD, supple, symmetrical, trachea midline, and thyroid  not enlarged, symmetric, no tenderness/mass/nodules Lungs: clear to auscultation bilaterally Heart: regular rate and rhythm, S1, S2 normal, no murmur, click, rub or gallop Extremities: extremities normal, atraumatic, no cyanosis or edema Pulses: 2+ and symmetric Skin: Skin color, texture, turgor  normal. No rashes or lesions Neurologic: Grossly normal  EKG EKG Interpretation Date/Time:  Monday September 21 2023 14:59:23 EST Ventricular Rate:  67 PR Interval:  136 QRS Duration:  80 QT Interval:  390 QTC Calculation: 412 R Axis:   87  Text Interpretation: Normal sinus rhythm ST & T wave abnormality, consider inferior ischemia ST & T wave abnormality, consider anterolateral ischemia No previous ECGs available Confirmed by Court Carrier [phone]) on  09/21/2023 3:20:58 PM    ASSESSMENT AND PLAN:   Elevated cholesterol History of hyperlipidemia not on statin therapy with lipid profile performed 04/09/2023 revealing total cholesterol 245, LDL 163 and HDL of 66.  Given her coronary calcium  score of 0 with CTA that showed no CAD back in 2017 her LDL goal should be in 100.  Will start her on atorvastatin  20 mg a day and we will recheck a lipid liver profile in 3 months.  Nonspecific abnormal electrocardiogram (ECG) (EKG) EKG shows anterolateral T wave inversion.  She had the same changes in 2017 and 2020.  She saw Dr. Waddell and Dr. Maranda for this.  She did have a coronary calcium  score of 0 and a CTA that showed no evidence of CAD.  I believe this is her normal.  No further evaluation is necessary at this time.  Leg pain Apparently patient's mother had PAD in her 19s.  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  She does complain of some pain mostly around her and is worried about PAD.  She has 2+ pedal pulses bilaterally.  I am going to get ABIs to further evaluate.     Carrier DOROTHA Court MD FACP,FACC,FAHA, [facility] 09/21/2023 3:35 PM

## 2023-09-21 NOTE — Assessment & Plan Note (Signed)
 EKG shows anterolateral T wave inversion.  She had the same changes in 2017 and 2020.  She saw Dr. Waddell and Dr. Maranda for this.  She did have a coronary calcium  score of 0 and a CTA that showed no evidence of CAD.  I believe this is her normal.  No further evaluation is necessary at this time.

## 2023-10-08 ENCOUNTER — Ambulatory Visit (HOSPITAL_COMMUNITY)
Admission: RE | Admit: 2023-10-08 | Discharge: 2023-10-08 | Disposition: A | Discharge status: Home / Self Care | Payer: Commercial Managed Care - PPO | Source: Ambulatory Visit | Attending: Cardiovascular Disease | Admitting: Cardiovascular Disease

## 2023-10-08 DIAGNOSIS — M79605 Pain in left leg: Secondary | ICD-10-CM | POA: Diagnosis not present

## 2023-10-08 DIAGNOSIS — M79604 Pain in right leg: Secondary | ICD-10-CM | POA: Diagnosis not present

## 2023-10-08 NOTE — Progress Notes (Signed)
Ankle-brachial index completed. Please see CV Procedures for preliminary results.  Shona Simpson, RVT 10/08/23 12:44 PM

## 2023-10-09 ENCOUNTER — Encounter: Payer: Self-pay | Admitting: *Deleted

## 2023-10-09 ENCOUNTER — Encounter (HOSPITAL_COMMUNITY): Payer: Commercial Managed Care - PPO

## 2023-10-09 LAB — VAS US ABI WITH/WO TBI
Left ABI: 1.12
Right ABI: 1.11

## 2023-10-14 ENCOUNTER — Encounter (HOSPITAL_BASED_OUTPATIENT_CLINIC_OR_DEPARTMENT_OTHER): Payer: Commercial Managed Care - PPO

## 2023-12-28 ENCOUNTER — Telehealth: Payer: Self-pay | Admitting: Cardiovascular Disease

## 2023-12-28 NOTE — Telephone Encounter (Signed)
 Called patient and made her aware per Dr. Katheryne Pane  her ABIs were completely normal.  Made patient aware per Dr. Katheryne Pane she does not have PAD. Patient has a an scheduled 5/8 with Sherline Distel, NP. Patient made aware of new location and verbalized.

## 2023-12-28 NOTE — Telephone Encounter (Signed)
 Pt c/o medication issue:  1. Name of Medication:  atorvastatin  (LIPITOR) 20 MG tablet   2. How are you currently taking this medication (dosage and times per day)?  As prescribed   3. Are you having a reaction (difficulty breathing--STAT)?   4. What is your medication issue?   Patient says she has been having right sided coldness, toes/feet burning. She says she completely stopped taking the medication. Patient also mentions she is late on having labs, but plans to have them done some time this week.

## 2023-12-28 NOTE — Telephone Encounter (Signed)
 Patient says she has been having right sided of leg cold, toes/feet burning that comes and goes. She says she completely stopped taking Atorvastatin  20 mg daily. Patient report she will have labs done this week. Patient is requesting to have an A1C done. Patient verbalizes she is in process of finding another provider. Will make  Cardiologist provider aware. Advise patient if symptoms persist call 911 or go to Emergency Room. Advise patient call office back for any office cancellations. Patient verbalized understanding

## 2023-12-30 DIAGNOSIS — E7800 Pure hypercholesterolemia, unspecified: Secondary | ICD-10-CM | POA: Diagnosis not present

## 2023-12-30 NOTE — Telephone Encounter (Signed)
 Attempted to call patient, no answer left message requesting a call back.

## 2023-12-30 NOTE — Telephone Encounter (Signed)
 Patient called back inform patient that Dr Katheryne Pane said levels were normal. Patients is still requesting to have A1C check before of what been going on with her. Please advise

## 2023-12-31 LAB — HEPATIC FUNCTION PANEL
ALT: 11 IU/L (ref 0–32)
AST: 19 IU/L (ref 0–40)
Albumin: 4.7 g/dL (ref 3.9–4.9)
Alkaline Phosphatase: 80 IU/L (ref 44–121)
Bilirubin Total: 0.6 mg/dL (ref 0.0–1.2)
Bilirubin, Direct: 0.21 mg/dL (ref 0.00–0.40)
Total Protein: 6.3 g/dL (ref 6.0–8.5)

## 2023-12-31 LAB — LIPID PANEL
Chol/HDL Ratio: 2.9 ratio (ref 0.0–4.4)
Cholesterol, Total: 177 mg/dL (ref 100–199)
HDL: 61 mg/dL (ref >39)
LDL Chol Calc (NIH): 102 mg/dL — ABNORMAL HIGH (ref 0–99)
Triglycerides: 72 mg/dL (ref 0–149)
VLDL Cholesterol Cal: 14 mg/dL (ref 5–40)

## 2023-12-31 NOTE — Telephone Encounter (Signed)
 Spoke to patient she stated she already had A1c done.She will keep appointment with Marlana Silvan NP 5/8 at 2:45 pm.

## 2024-01-06 ENCOUNTER — Ambulatory Visit: Admitting: Student in an Organized Health Care Education/Training Program

## 2024-01-06 ENCOUNTER — Ambulatory Visit (INDEPENDENT_AMBULATORY_CARE_PROVIDER_SITE_OTHER)

## 2024-01-06 ENCOUNTER — Ambulatory Visit: Payer: Self-pay

## 2024-01-06 ENCOUNTER — Ambulatory Visit: Admitting: Podiatry

## 2024-01-06 DIAGNOSIS — M7751 Other enthesopathy of right foot: Secondary | ICD-10-CM

## 2024-01-06 DIAGNOSIS — M79673 Pain in unspecified foot: Secondary | ICD-10-CM | POA: Diagnosis not present

## 2024-01-06 DIAGNOSIS — R8289 Other abnormal findings on cytological and histological examination of urine: Secondary | ICD-10-CM | POA: Diagnosis not present

## 2024-01-06 DIAGNOSIS — E785 Hyperlipidemia, unspecified: Secondary | ICD-10-CM | POA: Diagnosis not present

## 2024-01-06 DIAGNOSIS — M7752 Other enthesopathy of left foot: Secondary | ICD-10-CM

## 2024-01-06 DIAGNOSIS — G5792 Unspecified mononeuropathy of left lower limb: Secondary | ICD-10-CM | POA: Diagnosis not present

## 2024-01-06 DIAGNOSIS — E559 Vitamin D deficiency, unspecified: Secondary | ICD-10-CM | POA: Diagnosis not present

## 2024-01-06 DIAGNOSIS — R8290 Unspecified abnormal findings in urine: Secondary | ICD-10-CM | POA: Diagnosis not present

## 2024-01-06 DIAGNOSIS — R52 Pain, unspecified: Secondary | ICD-10-CM | POA: Diagnosis not present

## 2024-01-06 DIAGNOSIS — G5791 Unspecified mononeuropathy of right lower limb: Secondary | ICD-10-CM | POA: Diagnosis not present

## 2024-01-06 NOTE — Telephone Encounter (Signed)
  Chief Complaint: bilateral feet pain Symptoms: warmth, redness and pain in bilateral feet/toes Frequency: x 1.5-2 weeks Pertinent Negatives: Patient denies overuse/exercise or injury, fever Disposition: [] ED /[] Urgent Care (no appt availability in office) / [x] Appointment(In office/virtual)/ []  [facility]/ [] Home Care/ [] Refused Recommended Disposition /[] [facility]/ []  Follow-up with PCP Additional Notes: Patient states she overslept, missed appt this morning. She states she has an appointment with podiatry today. Patient states she has been rubbing her feet with diabetic lotion and Vitamin E oil, using OTC orthopedic memory insoles. She was seen in the past by sports medicine and told she has plantar fascitis. Advised patient to keep her appointment today with podiatry and patient states she will also reach out to her previous PCP. Patient's new patient appt rescheduled to next available with Emily [name].  Copied from CRM [id number]. Topic: Clinical - Red Word Triage >> Jan 06, 2024 10:09 AM Emily [name] wrote: Emily [name] that prompted transfer to Nurse Triage: Pain in feet going up legs, feet feel like they are on fire started getting worse in the last week, patient had an appointment scheduled for today and missed it. Reason for Disposition  [1] Looks infected (spreading redness, pus) AND [2] large red area (> 2 in. or 5 cm)  Answer Assessment - Initial Assessment Questions 1. ONSET: "When did the pain start?"      1.5 to 2 weeks ago, after clipping toenails.  2. LOCATION: "Where is the pain located?"      Both feet and worse in right foot.  3. PAIN: "How bad is the pain?"    (Scale 1-10; or mild, moderate, severe)  - MILD (1-3): doesn't interfere with normal activities.   - MODERATE (4-7): interferes with normal activities (e.g., work or school) or awakens from sleep, limping.   - SEVERE (8-10): excruciating pain, unable to do any normal activities, unable to walk.       5/10, she states it can get up to a 10.  4. WORK OR EXERCISE: "Has there been any recent work or exercise that involved this part of the body?"      Denies.  5. CAUSE: "What do you think is causing the foot pain?"     Patient states in the past she was diagnosed with plantar fascitis. She states she is concerned it could be gout, states she does not have a history of it but thought it could be related. Patient states she also has neuropathy.  6. OTHER SYMPTOMS: "Do you have any other symptoms?" (e.g., leg pain, rash, fever, numbness)     Warmth and redness in feet and radiating up both legs.  7. PREGNANCY: "Is there any chance you are pregnant?" "When was your last menstrual period?"     N/A.  Protocols used: Foot Pain-A-AH

## 2024-01-06 NOTE — Progress Notes (Signed)
 Subjective:  Patient ID: Emily [name], female    DOB: 07-27-1961,   MRN: [id number]  No chief complaint on file.   63 y.o. female presents for concern of numbness and burning in her legs. This has been ongoing for a while. She has a history of cancer and been on chemotherapy. She has been taking many vitamins and was started on lipitor and symptoms started progressive after this. Relates pain starting in the ball of the foot through heel and up her legs. Relates burning in nature. Describes the feeling of thickness and swelling in her feet with nothing present.  Has seen vascular and had ABIs and the symptoms started after this. She is pre-diabetic.  Aaron Aas Denies any other pedal complaints. Denies n/v/f/c.   Past Medical History:  Diagnosis Date   Abnormal EKG 06-Aug-2015   Anxiety and depression    pt denies 7/16/   Bruises easily    Chest pain    Chest pain with moderate risk of acute coronary syndrome    Chest tightness    Difficult intubation    throat feels small pt has acid reflux ? scars due to acid    Elevated cholesterol    Endometrial cancer (HCC)    Family history of premature CAD August 06, 2015   Father had MI 61's, died at 30 of an MI    Fatigue    Fibroid    Fx sacrum/coccyx-closed (HCC)    Gastritis    GERD (gastroesophageal reflux disease)    History of radiation therapy 07/09/2020-07/26/2020   vaginal HDR brachytherapy    Dr Retta Caster   Hyperlipidemia 08/06/15   Osteopenia    Pigmented skin lesions    Pre-diabetes    Sinus problem    SOB (shortness of breath) on exertion    Urethra disorder    currently swollen per pt    Vitamin D deficiency    Weight gain     Objective:  Physical Exam: Vascular: DP/PT pulses 2/4 bilateral. CFT <3 seconds. Normal hair growth on digits. No edema.  Skin. No lacerations or abrasions bilateral feet.  Musculoskeletal: MMT 5/5 bilateral lower extremities in DF, PF, Inversion and Eversion. Deceased ROM in DF of ankle joint. No  major tenderness to palpation about the foot. Negative tinel. No major deformites noted not pain with ROM of joints throughout foot. No pain to medial calcaneal tubercle.  Neurological: Sensation intact to light touch.      LOWER EXTREMITY DOPPLER STUDY      ABI Findings: +--------+------------------+-----+--------+--------+ Right   Rt Pressure (mmHg)IndexWaveformComment  +--------+------------------+-----+--------+--------+ [id number]                                     +--------+------------------+-----+--------+--------+ PTA     143               1.06 biphasic         +--------+------------------+-----+--------+--------+ DP      150               1.11 biphasic         +--------+------------------+-----+--------+--------+  +--------+------------------+-----+---------+-------+ Left    Lt Pressure (mmHg)IndexWaveform Comment +--------+------------------+-----+---------+-------+ [id number]                                     +--------+------------------+-----+---------+-------+ PTA     143  1.06 biphasic         +--------+------------------+-----+---------+-------+ DP      151               1.12 triphasic        +--------+------------------+-----+---------+-------+  +-------+-----------+-----------+------------+------------+ ABI/TBIToday's ABIToday's TBIPrevious ABIPrevious TBI +-------+-----------+-----------+------------+------------+ Right  1.11                  1.3                      +-------+-----------+-----------+------------+------------+ Left   1.12                  1.2         1.08         +-------+-----------+-----------+------------+------------+        Bilateral ABIs appear essentially unchanged compared to prior study on 08/31/19.   Summary: Right: Resting right ankle-brachial index is within normal range.  Left: Resting left ankle-brachial index is within normal  range.   *See table(s) above for measurements and observations.    Assessment:   1. Capsulitis of metatarsophalangeal (MTP) joints of both feet   2. Neuritis of right foot   3. Neuritis of left foot      Plan:  Patient was evaluated and treated and all questions answered. X-rays reviewed and discussed with patient. No acute fractures or dislocations noted.  Discussed neuropathy vs neuritis and etiology as well as treatment with patient.  Radiographs reviewed and discussed with patient.  -Discussed and educated patient on  foot care, especially with  regards to the vascular, neurological and musculoskeletal systems.  -Stressed the importance of good glycemic control and the detriment of not  controlling glucose levels in relation to the foot. -Discussed supportive shoes at all times and checking feet regularly.  -Diuscussed MRI to further evaluate and rule out any mechanical issues.  -Referral to neurology for possible NCV as this seems to be a nerve related problem and no obvious mechanical problem noted.  -Patient to return after MRI    Jennefer Moats, DPM

## 2024-01-08 ENCOUNTER — Encounter: Payer: Self-pay | Admitting: Podiatry

## 2024-01-11 DIAGNOSIS — M79673 Pain in unspecified foot: Secondary | ICD-10-CM | POA: Diagnosis not present

## 2024-01-11 DIAGNOSIS — R52 Pain, unspecified: Secondary | ICD-10-CM | POA: Diagnosis not present

## 2024-01-11 DIAGNOSIS — R8289 Other abnormal findings on cytological and histological examination of urine: Secondary | ICD-10-CM | POA: Diagnosis not present

## 2024-01-11 DIAGNOSIS — R8290 Unspecified abnormal findings in urine: Secondary | ICD-10-CM | POA: Diagnosis not present

## 2024-01-11 DIAGNOSIS — E559 Vitamin D deficiency, unspecified: Secondary | ICD-10-CM | POA: Diagnosis not present

## 2024-01-14 ENCOUNTER — Ambulatory Visit: Admitting: Student in an Organized Health Care Education/Training Program

## 2024-01-14 ENCOUNTER — Ambulatory Visit: Admitting: Nurse Practitioner

## 2024-01-14 ENCOUNTER — Ambulatory Visit: Admitting: Internal Medicine

## 2024-01-21 ENCOUNTER — Encounter: Payer: Self-pay | Admitting: Neurology

## 2024-01-25 ENCOUNTER — Encounter: Payer: Self-pay | Admitting: Cardiovascular Disease

## 2024-01-25 ENCOUNTER — Ambulatory Visit
Admission: RE | Admit: 2024-01-25 | Discharge: 2024-01-25 | Disposition: A | Discharge status: Home / Self Care | Source: Ambulatory Visit | Attending: Podiatry | Admitting: Podiatry

## 2024-01-25 DIAGNOSIS — R9431 Abnormal electrocardiogram [ECG] [EKG]: Secondary | ICD-10-CM

## 2024-01-25 DIAGNOSIS — G8929 Other chronic pain: Secondary | ICD-10-CM | POA: Diagnosis not present

## 2024-01-25 DIAGNOSIS — G5791 Unspecified mononeuropathy of right lower limb: Secondary | ICD-10-CM

## 2024-01-25 DIAGNOSIS — G5792 Unspecified mononeuropathy of left lower limb: Secondary | ICD-10-CM

## 2024-01-25 DIAGNOSIS — M79671 Pain in right foot: Secondary | ICD-10-CM | POA: Diagnosis not present

## 2024-01-25 DIAGNOSIS — M79672 Pain in left foot: Secondary | ICD-10-CM | POA: Diagnosis not present

## 2024-01-25 DIAGNOSIS — R7989 Other specified abnormal findings of blood chemistry: Secondary | ICD-10-CM

## 2024-01-25 NOTE — Telephone Encounter (Signed)
 Please review and advise.

## 2024-01-27 ENCOUNTER — Telehealth: Payer: Self-pay | Admitting: Podiatry

## 2024-01-27 NOTE — Telephone Encounter (Signed)
 Pt is in MyChart but cannot see results.Wondering why its states "resulted" but she cannot see results. If no answer leave a detailed message.

## 2024-02-02 ENCOUNTER — Telehealth: Payer: Self-pay | Admitting: Podiatry

## 2024-02-02 NOTE — Telephone Encounter (Signed)
 Pt called in today and on 5/12 and Hasn't received a call back. She wants to know the results of her MRI. She asks how long does it normally take for MRI's to be read? When should she expect to hear form doctor or assistant about this ?

## 2024-02-03 ENCOUNTER — Other Ambulatory Visit (HOSPITAL_COMMUNITY): Payer: Self-pay | Admitting: Family Medicine

## 2024-02-04 ENCOUNTER — Ambulatory Visit: Payer: Self-pay | Admitting: Cardiology

## 2024-02-04 LAB — CK: Total CK: 82 U/L (ref 32–182)

## 2024-02-04 NOTE — Progress Notes (Signed)
 CK enzymes are normal suggesting no muscle breakdown

## 2024-02-17 ENCOUNTER — Ambulatory Visit: Admitting: Cardiovascular Disease

## 2024-03-02 ENCOUNTER — Telehealth: Payer: Self-pay | Admitting: Radiation Oncology

## 2024-03-02 DIAGNOSIS — J029 Acute pharyngitis, unspecified: Secondary | ICD-10-CM | POA: Diagnosis not present

## 2024-03-02 DIAGNOSIS — H65193 Other acute nonsuppurative otitis media, bilateral: Secondary | ICD-10-CM | POA: Diagnosis not present

## 2024-03-02 DIAGNOSIS — R051 Acute cough: Secondary | ICD-10-CM | POA: Diagnosis not present

## 2024-03-02 DIAGNOSIS — J0110 Acute frontal sinusitis, unspecified: Secondary | ICD-10-CM | POA: Diagnosis not present

## 2024-03-02 NOTE — Progress Notes (Incomplete)
  Radiation Oncology         [phone] ________________________________  Name: Emily [name] MRN: [id number]  Date: 03/03/2024  DOB: 1960-12-25  Follow-Up Visit Note  CC: [name], FNP  [name], MD  No diagnosis found.  Diagnosis:   Stage IIIA (pT3a, pN0) endometrioid endometrial adenocarcinoma, FIGO grade 2    Interval Since Last Radiation: 3 years, 7 months, and 9 days    HDR brachytherapy dates: 07/09/2020, 07/12/2020, 07/16/2020, 07/19/2020, and 07/26/2020   Narrative:  The patient returns today for routine follow-up. The patient returns today for routine follow-up. She was last seen in office on 03/05/23 for a routine follow up. Patient continued to follow up with their specialists to manage their chronic conditions.    In the interval since she was last seen, she presented for a follow up with Dr. Viktoria on 07/23/23 during which she reported feeling well overall and was noted NED on exam.        No other significant oncologic interval history since the patient was last seen.                               Allergies:  is allergic to ceftin [cefuroxime], crestor  [rosuvastatin  calcium ], and latex.  Meds: Current Outpatient Medications  Medication Sig Dispense Refill   atorvastatin  (LIPITOR) 20 MG tablet Take 1 tablet (20 mg total) by mouth daily. 90 tablet 3   Cholecalciferol (VITAMIN D) 125 MCG (5000 UT) CAPS Take 5,000 Units by mouth daily.     famotidine  (PEPCID  AC MAXIMUM STRENGTH) 20 MG tablet Take 1 tablet (20 mg total) by mouth at bedtime as needed. 90 tablet 1   fluticasone  (FLONASE ) 50 MCG/ACT nasal spray Place 2 sprays into both nostrils as needed.     pantoprazole  (PROTONIX ) 20 MG tablet Take 1 tablet (20 mg total) by mouth daily. (Patient taking differently: Take 20 mg by mouth as needed.) 15 tablet 1   No current facility-administered medications for this encounter.    Physical Findings: The patient is in no acute distress. Patient is  alert and oriented.  vitals were not taken for this visit. .  No significant changes. Lungs are clear to auscultation bilaterally. Heart has regular rate and rhythm. No palpable cervical, supraclavicular, or axillary adenopathy. Abdomen soft, non-tender, normal bowel sounds.   Lab Findings: Lab Results  Component Value Date   WBC 7.7 11/07/2021   HGB 13.8 11/07/2021   HCT 40.8 11/07/2021   MCV 86.6 11/07/2021   PLT 259 11/07/2021    Radiographic Findings: No results found.  Impression: Stage IIIA (pT3a, pN0) endometrioid endometrial adenocarcinoma, FIGO grade 2   The patient is recovering from the effects of radiation.  ***  Plan:  ***   *** minutes of total time was spent for this patient encounter, including preparation, face-to-face counseling with the patient and coordination of care, physical exam, and documentation of the encounter. ____________________________________  Lynwood CHARM Nasuti, PhD, MD  This document serves as a record of services personally performed by Lynwood Nasuti, MD. It was created on his behalf by Reymundo Cartwright, a trained medical scribe. The creation of this record is based on the scribe's personal observations and the provider's statements to them. This document has been checked and approved by the attending provider.

## 2024-03-02 NOTE — Telephone Encounter (Signed)
 Pt called to cx appt due to illness, refused to r/s at this time.

## 2024-03-03 ENCOUNTER — Other Ambulatory Visit (HOSPITAL_BASED_OUTPATIENT_CLINIC_OR_DEPARTMENT_OTHER): Payer: Self-pay

## 2024-03-03 ENCOUNTER — Ambulatory Visit
Admission: RE | Admit: 2024-03-03 | Discharge: 2024-03-03 | Disposition: A | Discharge status: Home / Self Care | Payer: Self-pay | Source: Ambulatory Visit | Attending: Radiation Oncology | Admitting: Radiation Oncology

## 2024-03-03 MED ORDER — AZITHROMYCIN 250 MG PO TABS
ORAL_TABLET | ORAL | 0 refills | Status: AC
  Filled 2024-03-03: qty 6, 5d supply, fill #0

## 2024-03-07 ENCOUNTER — Other Ambulatory Visit (HOSPITAL_BASED_OUTPATIENT_CLINIC_OR_DEPARTMENT_OTHER): Payer: Self-pay

## 2024-03-09 DIAGNOSIS — H6993 Unspecified Eustachian tube disorder, bilateral: Secondary | ICD-10-CM | POA: Diagnosis not present

## 2024-03-21 ENCOUNTER — Telehealth: Payer: Self-pay | Admitting: Radiation Oncology

## 2024-03-21 NOTE — Telephone Encounter (Signed)
 7/14 Patient left voicemail to keep appt for 7/17.  Called pt back no answer left voicemail.  We will keep appointment as is, as requested.

## 2024-03-23 NOTE — Progress Notes (Signed)
  Radiation Oncology         [phone] ________________________________  Name: Emily [name] MRN: [id number]  Date: 03/24/2024  DOB: Mar 03, 1961  Follow-Up Visit Note  CC: [name], FNP  [name], MD  No diagnosis found.  Diagnosis:   Stage IIIA (pT3a, pN0) endometrioid endometrial adenocarcinoma, FIGO grade 2    Interval Since Last Radiation: 3 years, 7 months, and 30 days    HDR brachytherapy dates: 07/09/2020, 07/12/2020, 07/16/2020, 07/19/2020, and 07/26/2020   Narrative:  The patient returns today for routine follow-up. The patient returns today for routine follow-up. She was last seen in office on 03/05/23 for a routine follow up. Patient continued to follow up with their specialists to manage their chronic conditions.    In the interval since she was last seen, she presented for a follow up with Dr. Viktoria on 07/23/23 during which she reported feeling well overall and was noted NED on exam.        No other significant oncologic interval history since the patient was last seen.                               Allergies:  is allergic to ceftin [cefuroxime], crestor  [rosuvastatin  calcium ], and latex.  Meds: Current Outpatient Medications  Medication Sig Dispense Refill   atorvastatin  (LIPITOR) 20 MG tablet Take 1 tablet (20 mg total) by mouth daily. 90 tablet 3   Cholecalciferol (VITAMIN D) 125 MCG (5000 UT) CAPS Take 5,000 Units by mouth daily.     famotidine  (PEPCID  AC MAXIMUM STRENGTH) 20 MG tablet Take 1 tablet (20 mg total) by mouth at bedtime as needed. 90 tablet 1   fluticasone  (FLONASE ) 50 MCG/ACT nasal spray Place 2 sprays into both nostrils as needed.     pantoprazole  (PROTONIX ) 20 MG tablet Take 1 tablet (20 mg total) by mouth daily. (Patient taking differently: Take 20 mg by mouth as needed.) 15 tablet 1   No current facility-administered medications for this encounter.    Physical Findings: The patient is in no acute distress. Patient is  alert and oriented.  vitals were not taken for this visit. .  No significant changes. Lungs are clear to auscultation bilaterally. Heart has regular rate and rhythm. No palpable cervical, supraclavicular, or axillary adenopathy. Abdomen soft, non-tender, normal bowel sounds.   Lab Findings: Lab Results  Component Value Date   WBC 7.7 11/07/2021   HGB 13.8 11/07/2021   HCT 40.8 11/07/2021   MCV 86.6 11/07/2021   PLT 259 11/07/2021    Radiographic Findings: No results found.  Impression: Stage IIIA (pT3a, pN0) endometrioid endometrial adenocarcinoma, FIGO grade 2   The patient is recovering from the effects of radiation.  ***  Plan:  ***   *** minutes of total time was spent for this patient encounter, including preparation, face-to-face counseling with the patient and coordination of care, physical exam, and documentation of the encounter. ____________________________________  Lynwood CHARM Nasuti, PhD, MD  This document serves as a record of services personally performed by Lynwood Nasuti, MD. It was created on his behalf by Reymundo Cartwright, a trained medical scribe. The creation of this record is based on the scribe's personal observations and the provider's statements to them. This document has been checked and approved by the attending provider.

## 2024-03-24 ENCOUNTER — Encounter: Payer: Self-pay | Admitting: Radiation Oncology

## 2024-03-24 ENCOUNTER — Ambulatory Visit
Admission: RE | Admit: 2024-03-24 | Discharge: 2024-03-24 | Disposition: A | Discharge status: Home / Self Care | Source: Ambulatory Visit | Attending: Radiation Oncology | Admitting: Radiation Oncology

## 2024-03-24 VITALS — BP 118/73 | HR 68 | Temp 96.4°F | Resp 18 | Ht 64.0 in | Wt 154.0 lb

## 2024-03-24 DIAGNOSIS — Z923 Personal history of irradiation: Secondary | ICD-10-CM | POA: Diagnosis not present

## 2024-03-24 DIAGNOSIS — Z8542 Personal history of malignant neoplasm of other parts of uterus: Secondary | ICD-10-CM | POA: Insufficient documentation

## 2024-03-24 DIAGNOSIS — Z79899 Other long term (current) drug therapy: Secondary | ICD-10-CM | POA: Diagnosis not present

## 2024-03-24 DIAGNOSIS — C541 Malignant neoplasm of endometrium: Secondary | ICD-10-CM | POA: Diagnosis not present

## 2024-03-24 NOTE — Progress Notes (Signed)
 Emily [name] returns today for follow up post radiation to the pelvic.  They completed their radiation on: 07/26/20  Does the patient complain of any of the following:  Pain:Denies Abdominal bloating: Denies Diarrhea/Constipation: Denies Nausea/Vomiting: Denies Vaginal Discharge: Denies Blood in Urine or Stool: Denies Urinary Issues (dysuria/incomplete emptying/ incontinence/ increased frequency/urgency): Denies Does patient report using vaginal dilator 2-3 times a week and/or sexually active 2-3 weeks: She reports using vaginal dilators once a month Post radiation skin changes: Denies   Additional comments if applicable:  BP 118/73 (BP Location: Left Arm, Patient Position: Sitting)   Pulse 68   Temp (!) 96.4 F (35.8 C) (Temporal)   Resp 18   Ht 5' 4 (1.626 m)   Wt 154 lb (69.9 kg)   SpO2 100%   BMI 26.43 kg/m

## 2024-03-30 ENCOUNTER — Ambulatory Visit: Admitting: Neurology

## 2024-03-31 ENCOUNTER — Ambulatory Visit: Admitting: Radiation Oncology

## 2024-04-07 DIAGNOSIS — E785 Hyperlipidemia, unspecified: Secondary | ICD-10-CM | POA: Diagnosis not present

## 2024-04-07 DIAGNOSIS — E559 Vitamin D deficiency, unspecified: Secondary | ICD-10-CM | POA: Diagnosis not present

## 2024-04-07 DIAGNOSIS — K219 Gastro-esophageal reflux disease without esophagitis: Secondary | ICD-10-CM | POA: Diagnosis not present

## 2024-04-07 DIAGNOSIS — Z0000 Encounter for general adult medical examination without abnormal findings: Secondary | ICD-10-CM | POA: Diagnosis not present

## 2024-04-07 DIAGNOSIS — Z1211 Encounter for screening for malignant neoplasm of colon: Secondary | ICD-10-CM | POA: Diagnosis not present

## 2024-04-07 DIAGNOSIS — M26609 Unspecified temporomandibular joint disorder, unspecified side: Secondary | ICD-10-CM | POA: Diagnosis not present

## 2024-04-07 DIAGNOSIS — E2839 Other primary ovarian failure: Secondary | ICD-10-CM | POA: Diagnosis not present

## 2024-04-07 DIAGNOSIS — R143 Flatulence: Secondary | ICD-10-CM | POA: Diagnosis not present

## 2024-04-07 DIAGNOSIS — R7303 Prediabetes: Secondary | ICD-10-CM | POA: Diagnosis not present

## 2024-04-07 DIAGNOSIS — C541 Malignant neoplasm of endometrium: Secondary | ICD-10-CM | POA: Diagnosis not present

## 2024-04-08 ENCOUNTER — Other Ambulatory Visit (HOSPITAL_BASED_OUTPATIENT_CLINIC_OR_DEPARTMENT_OTHER): Payer: Self-pay | Admitting: Family Medicine

## 2024-04-08 DIAGNOSIS — E2839 Other primary ovarian failure: Secondary | ICD-10-CM

## 2024-04-20 DIAGNOSIS — E559 Vitamin D deficiency, unspecified: Secondary | ICD-10-CM | POA: Diagnosis not present

## 2024-04-20 DIAGNOSIS — E785 Hyperlipidemia, unspecified: Secondary | ICD-10-CM | POA: Diagnosis not present

## 2024-04-20 DIAGNOSIS — E538 Deficiency of other specified B group vitamins: Secondary | ICD-10-CM | POA: Diagnosis not present

## 2024-04-20 DIAGNOSIS — R7303 Prediabetes: Secondary | ICD-10-CM | POA: Diagnosis not present

## 2024-04-26 ENCOUNTER — Other Ambulatory Visit (HOSPITAL_COMMUNITY): Payer: Self-pay

## 2024-04-26 ENCOUNTER — Other Ambulatory Visit (HOSPITAL_BASED_OUTPATIENT_CLINIC_OR_DEPARTMENT_OTHER): Payer: Self-pay

## 2024-04-26 MED ORDER — MOUNJARO 2.5 MG/0.5ML ~~LOC~~ SOAJ
2.5000 mg | SUBCUTANEOUS | 0 refills | Status: AC
  Filled 2024-04-26: qty 2, 28d supply, fill #0

## 2024-04-28 ENCOUNTER — Other Ambulatory Visit: Payer: Self-pay

## 2024-04-28 ENCOUNTER — Other Ambulatory Visit (HOSPITAL_BASED_OUTPATIENT_CLINIC_OR_DEPARTMENT_OTHER): Payer: Self-pay

## 2024-04-28 MED ORDER — PITAVASTATIN CALCIUM 1 MG PO TABS
1.0000 mg | ORAL_TABLET | Freq: Every day | ORAL | 1 refills | Status: DC
  Filled 2024-04-28: qty 90, 90d supply, fill #0

## 2024-04-29 ENCOUNTER — Other Ambulatory Visit (HOSPITAL_BASED_OUTPATIENT_CLINIC_OR_DEPARTMENT_OTHER): Payer: Self-pay

## 2024-05-02 ENCOUNTER — Other Ambulatory Visit (HOSPITAL_BASED_OUTPATIENT_CLINIC_OR_DEPARTMENT_OTHER): Payer: Self-pay

## 2024-05-12 ENCOUNTER — Other Ambulatory Visit (HOSPITAL_BASED_OUTPATIENT_CLINIC_OR_DEPARTMENT_OTHER): Payer: Self-pay

## 2024-05-12 MED ORDER — SIMVASTATIN 20 MG PO TABS
20.0000 mg | ORAL_TABLET | Freq: Every evening | ORAL | 3 refills | Status: AC
  Filled 2024-05-12: qty 90, 90d supply, fill #0

## 2024-05-13 ENCOUNTER — Other Ambulatory Visit (HOSPITAL_BASED_OUTPATIENT_CLINIC_OR_DEPARTMENT_OTHER): Payer: Self-pay

## 2024-05-17 ENCOUNTER — Other Ambulatory Visit (HOSPITAL_BASED_OUTPATIENT_CLINIC_OR_DEPARTMENT_OTHER): Payer: Self-pay

## 2025-03-23 ENCOUNTER — Ambulatory Visit: Admitting: Radiation Oncology
